# Patient Record
Sex: Male | Born: 1980
Health system: Southern US, Community
[De-identification: ages and names within clinical notes are randomized; demographics above are authoritative.]

## PROBLEM LIST (undated history)

## (undated) DIAGNOSIS — N182 Chronic kidney disease, stage 2 (mild): Secondary | ICD-10-CM

## (undated) DIAGNOSIS — Z21 Asymptomatic human immunodeficiency virus [HIV] infection status: Secondary | ICD-10-CM

## (undated) DIAGNOSIS — E039 Hypothyroidism, unspecified: Secondary | ICD-10-CM

## (undated) DIAGNOSIS — D61818 Other pancytopenia: Secondary | ICD-10-CM

## (undated) DIAGNOSIS — N059 Unspecified nephritic syndrome with unspecified morphologic changes: Secondary | ICD-10-CM

## (undated) DIAGNOSIS — B399 Histoplasmosis, unspecified: Secondary | ICD-10-CM

## (undated) DIAGNOSIS — B20 Human immunodeficiency virus [HIV] disease: Secondary | ICD-10-CM

## (undated) DIAGNOSIS — M7989 Other specified soft tissue disorders: Secondary | ICD-10-CM

## (undated) DIAGNOSIS — R499 Unspecified voice and resonance disorder: Secondary | ICD-10-CM

## (undated) DIAGNOSIS — I509 Heart failure, unspecified: Secondary | ICD-10-CM

## (undated) DIAGNOSIS — R609 Edema, unspecified: Secondary | ICD-10-CM

## (undated) DIAGNOSIS — R221 Localized swelling, mass and lump, neck: Secondary | ICD-10-CM

## (undated) DIAGNOSIS — T7840XA Allergy, unspecified, initial encounter: Secondary | ICD-10-CM

## (undated) HISTORY — DX: Edema, unspecified: R60.9

## (undated) HISTORY — DX: Unspecified voice and resonance disorder: R49.9

## (undated) HISTORY — DX: Unspecified nephritic syndrome with unspecified morphologic changes: N05.9

## (undated) HISTORY — DX: Localized swelling, mass and lump, neck: R22.1

## (undated) HISTORY — PX: NO PAST SURGERIES: SHX2092

## (undated) HISTORY — DX: Allergy, unspecified, initial encounter: T78.40XA

---

## 2005-05-03 ENCOUNTER — Emergency Department (HOSPITAL_COMMUNITY): Admission: EM | Admit: 2005-05-03 | Discharge: 2005-05-03 | Payer: Self-pay | Admitting: Emergency Medicine

## 2005-05-08 ENCOUNTER — Emergency Department (HOSPITAL_COMMUNITY): Admission: EM | Admit: 2005-05-08 | Discharge: 2005-05-09 | Payer: Self-pay | Admitting: Emergency Medicine

## 2006-06-10 ENCOUNTER — Inpatient Hospital Stay (HOSPITAL_COMMUNITY): Admission: EM | Admit: 2006-06-10 | Discharge: 2006-06-24 | Payer: Self-pay | Admitting: Emergency Medicine

## 2006-06-10 ENCOUNTER — Ambulatory Visit: Payer: Self-pay | Admitting: Emergency Medicine

## 2006-06-11 ENCOUNTER — Ambulatory Visit: Payer: Self-pay | Admitting: Infectious Diseases

## 2006-06-15 ENCOUNTER — Encounter: Payer: Self-pay | Admitting: Emergency Medicine

## 2006-06-16 ENCOUNTER — Encounter: Payer: Self-pay | Admitting: Internal Medicine

## 2006-06-18 ENCOUNTER — Encounter (INDEPENDENT_AMBULATORY_CARE_PROVIDER_SITE_OTHER): Payer: Self-pay | Admitting: Internal Medicine

## 2006-06-28 ENCOUNTER — Encounter (INDEPENDENT_AMBULATORY_CARE_PROVIDER_SITE_OTHER): Payer: Self-pay | Admitting: *Deleted

## 2006-06-28 ENCOUNTER — Encounter: Payer: Self-pay | Admitting: Infectious Diseases

## 2006-06-28 ENCOUNTER — Ambulatory Visit: Payer: Self-pay | Admitting: Pulmonary Disease

## 2006-06-28 ENCOUNTER — Inpatient Hospital Stay (HOSPITAL_COMMUNITY): Admission: AD | Admit: 2006-06-28 | Discharge: 2006-07-12 | Payer: Self-pay | Admitting: Internal Medicine

## 2006-06-28 ENCOUNTER — Ambulatory Visit: Payer: Self-pay | Admitting: Internal Medicine

## 2006-06-28 ENCOUNTER — Ambulatory Visit: Payer: Self-pay | Admitting: Hospitalist

## 2006-06-28 DIAGNOSIS — B20 Human immunodeficiency virus [HIV] disease: Secondary | ICD-10-CM | POA: Insufficient documentation

## 2006-06-28 DIAGNOSIS — B399 Histoplasmosis, unspecified: Secondary | ICD-10-CM

## 2006-06-28 HISTORY — DX: Histoplasmosis, unspecified: B39.9

## 2006-06-28 LAB — CONVERTED CEMR LAB
Albumin: 1.9 g/dL — ABNORMAL LOW (ref 3.5–5.2)
Alkaline Phosphatase: 212 units/L — ABNORMAL HIGH (ref 39–117)
Basophils Relative: 0 % (ref 0–1)
Calcium: 8.7 mg/dL (ref 8.4–10.5)
Cortisol, Plasma: 55.7 ug/dL
Glucose, Bld: 138 mg/dL — ABNORMAL HIGH (ref 70–99)
Hemoglobin: 12.7 g/dL — ABNORMAL LOW (ref 13.0–17.0)
Monocytes Absolute: 0 10*3/uL — ABNORMAL LOW (ref 0.2–0.7)
Neutro Abs: 5.3 10*3/uL (ref 1.7–7.7)
Potassium: 4.5 meq/L (ref 3.5–5.3)
RDW: 21 % — ABNORMAL HIGH (ref 11.5–14.0)
Total Bilirubin: 1.2 mg/dL (ref 0.3–1.2)
Total Protein: 6.8 g/dL (ref 6.0–8.3)

## 2006-07-11 ENCOUNTER — Telehealth (INDEPENDENT_AMBULATORY_CARE_PROVIDER_SITE_OTHER): Payer: Self-pay | Admitting: *Deleted

## 2006-07-26 ENCOUNTER — Encounter (INDEPENDENT_AMBULATORY_CARE_PROVIDER_SITE_OTHER): Payer: Self-pay | Admitting: *Deleted

## 2006-07-31 ENCOUNTER — Ambulatory Visit: Payer: Self-pay | Admitting: Internal Medicine

## 2006-07-31 DIAGNOSIS — Z8709 Personal history of other diseases of the respiratory system: Secondary | ICD-10-CM | POA: Insufficient documentation

## 2006-08-01 DIAGNOSIS — D61818 Other pancytopenia: Secondary | ICD-10-CM

## 2006-08-01 DIAGNOSIS — R578 Other shock: Secondary | ICD-10-CM | POA: Insufficient documentation

## 2006-08-03 ENCOUNTER — Telehealth: Payer: Self-pay | Admitting: Internal Medicine

## 2006-08-10 ENCOUNTER — Telehealth: Payer: Self-pay | Admitting: Internal Medicine

## 2006-08-24 ENCOUNTER — Telehealth: Payer: Self-pay | Admitting: Internal Medicine

## 2006-09-05 ENCOUNTER — Telehealth: Payer: Self-pay | Admitting: Internal Medicine

## 2006-09-18 ENCOUNTER — Encounter: Admission: RE | Admit: 2006-09-18 | Discharge: 2006-09-18 | Payer: Self-pay | Admitting: Internal Medicine

## 2006-09-18 ENCOUNTER — Ambulatory Visit: Payer: Self-pay | Admitting: Internal Medicine

## 2006-09-18 ENCOUNTER — Telehealth: Payer: Self-pay | Admitting: Internal Medicine

## 2006-09-18 LAB — CONVERTED CEMR LAB
AST: 22 units/L (ref 0–37)
Albumin: 4.5 g/dL (ref 3.5–5.2)
CO2: 25 meq/L (ref 19–32)
Calcium: 9.2 mg/dL (ref 8.4–10.5)
Chloride: 104 meq/L (ref 96–112)
Creatinine, Ser: 0.7 mg/dL (ref 0.40–1.50)
Eosinophils Absolute: 2.1 10*3/uL — ABNORMAL HIGH (ref 0.0–0.7)
Glucose, Bld: 91 mg/dL (ref 70–99)
Hemoglobin: 16.3 g/dL (ref 13.0–17.0)
Hep B S Ab: NEGATIVE
Lymphs Abs: 1.8 10*3/uL (ref 0.7–3.3)
Monocytes Relative: 7 % (ref 3–11)
Platelets: 226 10*3/uL (ref 150–400)
Potassium: 3.9 meq/L (ref 3.5–5.3)
RBC: 5.21 M/uL (ref 4.22–5.81)
RDW: 14.4 % — ABNORMAL HIGH (ref 11.5–14.0)
Sodium: 138 meq/L (ref 135–145)
Total Bilirubin: 0.3 mg/dL (ref 0.3–1.2)
WBC: 7.1 10*3/uL (ref 4.0–10.5)

## 2006-10-01 ENCOUNTER — Telehealth: Payer: Self-pay | Admitting: Internal Medicine

## 2006-10-03 ENCOUNTER — Ambulatory Visit: Payer: Self-pay | Admitting: Internal Medicine

## 2006-10-05 ENCOUNTER — Encounter (INDEPENDENT_AMBULATORY_CARE_PROVIDER_SITE_OTHER): Payer: Self-pay | Admitting: *Deleted

## 2006-11-01 ENCOUNTER — Telehealth: Payer: Self-pay | Admitting: Internal Medicine

## 2006-11-02 ENCOUNTER — Ambulatory Visit: Payer: Self-pay | Admitting: Internal Medicine

## 2006-12-03 ENCOUNTER — Telehealth: Payer: Self-pay | Admitting: Internal Medicine

## 2006-12-31 ENCOUNTER — Encounter: Payer: Self-pay | Admitting: Internal Medicine

## 2007-01-01 ENCOUNTER — Encounter (INDEPENDENT_AMBULATORY_CARE_PROVIDER_SITE_OTHER): Payer: Self-pay | Admitting: *Deleted

## 2007-01-08 ENCOUNTER — Ambulatory Visit: Payer: Self-pay | Admitting: Internal Medicine

## 2007-01-08 ENCOUNTER — Telehealth: Payer: Self-pay | Admitting: Internal Medicine

## 2007-01-08 ENCOUNTER — Encounter: Admission: RE | Admit: 2007-01-08 | Discharge: 2007-01-08 | Payer: Self-pay | Admitting: Internal Medicine

## 2007-01-08 LAB — CONVERTED CEMR LAB
ALT: 25 units/L (ref 0–53)
BUN: 12 mg/dL (ref 6–23)
Basophils Relative: 0 % (ref 0–1)
Glucose, Bld: 96 mg/dL (ref 70–99)
HIV 1 RNA Quant: 98400 copies/mL — ABNORMAL HIGH (ref <50)
Lymphocytes Relative: 29 % (ref 12–46)
Lymphs Abs: 1.1 10*3/uL (ref 0.7–4.0)
Neutro Abs: 2.2 10*3/uL (ref 1.7–7.7)
Neutrophils Relative %: 58 % (ref 43–77)
RDW: 14.4 % (ref 11.5–15.5)
Sodium: 138 meq/L (ref 135–145)
Total Bilirubin: 0.3 mg/dL (ref 0.3–1.2)
Total Protein: 8.1 g/dL (ref 6.0–8.3)

## 2007-01-09 ENCOUNTER — Ambulatory Visit: Payer: Self-pay | Admitting: Internal Medicine

## 2007-01-10 ENCOUNTER — Telehealth: Payer: Self-pay | Admitting: Internal Medicine

## 2007-01-24 ENCOUNTER — Telehealth: Payer: Self-pay | Admitting: Internal Medicine

## 2007-01-25 ENCOUNTER — Ambulatory Visit: Payer: Self-pay | Admitting: Internal Medicine

## 2007-01-25 DIAGNOSIS — R21 Rash and other nonspecific skin eruption: Secondary | ICD-10-CM

## 2007-01-25 LAB — CONVERTED CEMR LAB

## 2007-02-06 ENCOUNTER — Encounter: Payer: Self-pay | Admitting: Internal Medicine

## 2007-02-06 ENCOUNTER — Encounter (INDEPENDENT_AMBULATORY_CARE_PROVIDER_SITE_OTHER): Payer: Self-pay | Admitting: *Deleted

## 2007-02-12 ENCOUNTER — Telehealth: Payer: Self-pay | Admitting: Internal Medicine

## 2007-02-13 ENCOUNTER — Ambulatory Visit: Payer: Self-pay | Admitting: Internal Medicine

## 2007-02-13 DIAGNOSIS — L408 Other psoriasis: Secondary | ICD-10-CM | POA: Insufficient documentation

## 2007-02-18 ENCOUNTER — Telehealth: Payer: Self-pay | Admitting: Internal Medicine

## 2007-03-14 ENCOUNTER — Telehealth: Payer: Self-pay | Admitting: Internal Medicine

## 2007-03-15 ENCOUNTER — Encounter: Admission: RE | Admit: 2007-03-15 | Discharge: 2007-03-15 | Payer: Self-pay | Admitting: Internal Medicine

## 2007-03-15 ENCOUNTER — Ambulatory Visit: Payer: Self-pay | Admitting: Internal Medicine

## 2007-03-15 LAB — CONVERTED CEMR LAB
ALT: 19 units/L (ref 0–53)
Albumin: 4 g/dL (ref 3.5–5.2)
BUN: 12 mg/dL (ref 6–23)
Basophils Absolute: 0 10*3/uL (ref 0.0–0.1)
Chloride: 104 meq/L (ref 96–112)
Creatinine, Ser: 0.86 mg/dL (ref 0.40–1.50)
Glucose, Bld: 132 mg/dL — ABNORMAL HIGH (ref 70–99)
HCT: 48.9 % (ref 39.0–52.0)
HIV 1 RNA Quant: 181 copies/mL — ABNORMAL HIGH (ref <50)
HIV-1 RNA Quant, Log: 2.26 — ABNORMAL HIGH (ref <1.70)
Lymphs Abs: 1.8 10*3/uL (ref 0.7–4.0)
MCV: 91.2 fL (ref 78.0–100.0)
Monocytes Relative: 6 % (ref 3–12)
Neutro Abs: 3.6 10*3/uL (ref 1.7–7.7)
Neutrophils Relative %: 59 % (ref 43–77)
Platelets: 228 10*3/uL (ref 150–400)
Potassium: 4 meq/L (ref 3.5–5.3)
RDW: 14.8 % (ref 11.5–15.5)
Total Bilirubin: 2.8 mg/dL — ABNORMAL HIGH (ref 0.3–1.2)
WBC: 6.1 10*3/uL (ref 4.0–10.5)

## 2007-03-29 ENCOUNTER — Ambulatory Visit: Payer: Self-pay | Admitting: Internal Medicine

## 2007-04-10 ENCOUNTER — Telehealth (INDEPENDENT_AMBULATORY_CARE_PROVIDER_SITE_OTHER): Payer: Self-pay | Admitting: *Deleted

## 2007-05-09 ENCOUNTER — Telehealth (INDEPENDENT_AMBULATORY_CARE_PROVIDER_SITE_OTHER): Payer: Self-pay | Admitting: *Deleted

## 2007-07-01 ENCOUNTER — Ambulatory Visit: Payer: Self-pay | Admitting: Internal Medicine

## 2007-07-01 ENCOUNTER — Encounter: Admission: RE | Admit: 2007-07-01 | Discharge: 2007-07-01 | Payer: Self-pay | Admitting: Internal Medicine

## 2007-07-01 LAB — CONVERTED CEMR LAB
ALT: 22 units/L (ref 0–53)
Basophils Absolute: 0 10*3/uL (ref 0.0–0.1)
CO2: 22 meq/L (ref 19–32)
Calcium: 9.1 mg/dL (ref 8.4–10.5)
Chloride: 106 meq/L (ref 96–112)
HIV 1 RNA Quant: 96 copies/mL — ABNORMAL HIGH (ref <50)
Hemoglobin: 15.8 g/dL (ref 13.0–17.0)
Lymphocytes Relative: 41 % (ref 12–46)
Lymphs Abs: 1.7 10*3/uL (ref 0.7–4.0)
Monocytes Absolute: 0.5 10*3/uL (ref 0.1–1.0)
Neutro Abs: 1.8 10*3/uL (ref 1.7–7.7)
Platelets: 161 10*3/uL (ref 150–400)
RDW: 15.8 % — ABNORMAL HIGH (ref 11.5–15.5)
Sodium: 139 meq/L (ref 135–145)
Total Protein: 7.1 g/dL (ref 6.0–8.3)
WBC: 4.2 10*3/uL (ref 4.0–10.5)

## 2007-07-09 ENCOUNTER — Telehealth (INDEPENDENT_AMBULATORY_CARE_PROVIDER_SITE_OTHER): Payer: Self-pay | Admitting: *Deleted

## 2007-07-26 ENCOUNTER — Ambulatory Visit: Payer: Self-pay | Admitting: Internal Medicine

## 2007-07-26 DIAGNOSIS — B079 Viral wart, unspecified: Secondary | ICD-10-CM

## 2007-08-08 ENCOUNTER — Telehealth (INDEPENDENT_AMBULATORY_CARE_PROVIDER_SITE_OTHER): Payer: Self-pay | Admitting: *Deleted

## 2007-09-05 ENCOUNTER — Telehealth (INDEPENDENT_AMBULATORY_CARE_PROVIDER_SITE_OTHER): Payer: Self-pay | Admitting: *Deleted

## 2007-10-24 ENCOUNTER — Ambulatory Visit: Payer: Self-pay | Admitting: Internal Medicine

## 2007-10-24 LAB — CONVERTED CEMR LAB
Alkaline Phosphatase: 106 units/L (ref 39–117)
Basophils Absolute: 0 10*3/uL (ref 0.0–0.1)
Basophils Relative: 1 % (ref 0–1)
Eosinophils Absolute: 0.2 10*3/uL (ref 0.0–0.7)
Eosinophils Relative: 4 % (ref 0–5)
Glucose, Bld: 94 mg/dL (ref 70–99)
HCT: 44.7 % (ref 39.0–52.0)
HIV 1 RNA Quant: 240 copies/mL — ABNORMAL HIGH (ref <50)
Lymphs Abs: 2.6 10*3/uL (ref 0.7–4.0)
MCHC: 34.7 g/dL (ref 30.0–36.0)
MCV: 88.3 fL (ref 78.0–100.0)
Platelets: 218 10*3/uL (ref 150–400)
RDW: 14 % (ref 11.5–15.5)
Sodium: 136 meq/L (ref 135–145)
Total Bilirubin: 2.3 mg/dL — ABNORMAL HIGH (ref 0.3–1.2)
Total Protein: 7.5 g/dL (ref 6.0–8.3)

## 2007-11-01 ENCOUNTER — Telehealth (INDEPENDENT_AMBULATORY_CARE_PROVIDER_SITE_OTHER): Payer: Self-pay | Admitting: *Deleted

## 2007-11-15 ENCOUNTER — Ambulatory Visit: Payer: Self-pay | Admitting: Internal Medicine

## 2007-12-04 ENCOUNTER — Telehealth (INDEPENDENT_AMBULATORY_CARE_PROVIDER_SITE_OTHER): Payer: Self-pay | Admitting: *Deleted

## 2008-01-01 ENCOUNTER — Telehealth (INDEPENDENT_AMBULATORY_CARE_PROVIDER_SITE_OTHER): Payer: Self-pay | Admitting: *Deleted

## 2008-01-23 ENCOUNTER — Ambulatory Visit: Payer: Self-pay | Admitting: Internal Medicine

## 2008-01-23 LAB — CONVERTED CEMR LAB
BUN: 11 mg/dL (ref 6–23)
Basophils Relative: 1 % (ref 0–1)
CO2: 22 meq/L (ref 19–32)
Calcium: 9.3 mg/dL (ref 8.4–10.5)
Chloride: 103 meq/L (ref 96–112)
Cholesterol: 167 mg/dL (ref 0–200)
Creatinine, Ser: 0.8 mg/dL (ref 0.40–1.50)
Glucose, Bld: 102 mg/dL — ABNORMAL HIGH (ref 70–99)
HDL: 34 mg/dL — ABNORMAL LOW (ref >39)
Hemoglobin: 17.6 g/dL — ABNORMAL HIGH (ref 13.0–17.0)
Lymphocytes Relative: 29 % (ref 12–46)
MCHC: 35.3 g/dL (ref 30.0–36.0)
Monocytes Absolute: 0.6 10*3/uL (ref 0.1–1.0)
Monocytes Relative: 9 % (ref 3–12)
Neutro Abs: 3.6 10*3/uL (ref 1.7–7.7)
RBC: 5.62 M/uL (ref 4.22–5.81)
Total CHOL/HDL Ratio: 4.9
Triglycerides: 471 mg/dL — ABNORMAL HIGH (ref <150)

## 2008-01-31 ENCOUNTER — Telehealth (INDEPENDENT_AMBULATORY_CARE_PROVIDER_SITE_OTHER): Payer: Self-pay | Admitting: *Deleted

## 2008-02-12 ENCOUNTER — Encounter: Payer: Self-pay | Admitting: Internal Medicine

## 2008-02-21 ENCOUNTER — Encounter (INDEPENDENT_AMBULATORY_CARE_PROVIDER_SITE_OTHER): Payer: Self-pay | Admitting: *Deleted

## 2008-02-21 ENCOUNTER — Ambulatory Visit: Payer: Self-pay | Admitting: Internal Medicine

## 2008-04-14 ENCOUNTER — Encounter (INDEPENDENT_AMBULATORY_CARE_PROVIDER_SITE_OTHER): Payer: Self-pay | Admitting: *Deleted

## 2008-05-06 ENCOUNTER — Encounter (INDEPENDENT_AMBULATORY_CARE_PROVIDER_SITE_OTHER): Payer: Self-pay | Admitting: *Deleted

## 2008-05-21 ENCOUNTER — Ambulatory Visit: Payer: Self-pay | Admitting: Internal Medicine

## 2008-05-21 LAB — CONVERTED CEMR LAB
Albumin: 4.3 g/dL (ref 3.5–5.2)
BUN: 12 mg/dL (ref 6–23)
Basophils Relative: 0 % (ref 0–1)
CO2: 22 meq/L (ref 19–32)
Calcium: 9.4 mg/dL (ref 8.4–10.5)
Chloride: 106 meq/L (ref 96–112)
Creatinine, Ser: 1.22 mg/dL (ref 0.40–1.50)
GFR calc Af Amer: >60 mL/min (ref >60)
MCHC: 35.5 g/dL (ref 30.0–36.0)
Monocytes Relative: 6 % (ref 3–12)
Neutro Abs: 4.3 10*3/uL (ref 1.7–7.7)
Neutrophils Relative %: 65 % (ref 43–77)
Platelets: 232 10*3/uL (ref 150–400)
RBC: 5.03 M/uL (ref 4.22–5.81)
WBC: 6.6 10*3/uL (ref 4.0–10.5)

## 2008-06-05 ENCOUNTER — Ambulatory Visit: Payer: Self-pay | Admitting: Internal Medicine

## 2008-08-14 IMAGING — CR DG CHEST 2V
2 series · 2 of 2 positions shown · non-contrast
Comparison: none

CLINICAL DATA: Dyspnea with rash on chest.
 CHEST - 2 VIEW:

[w chest pa]
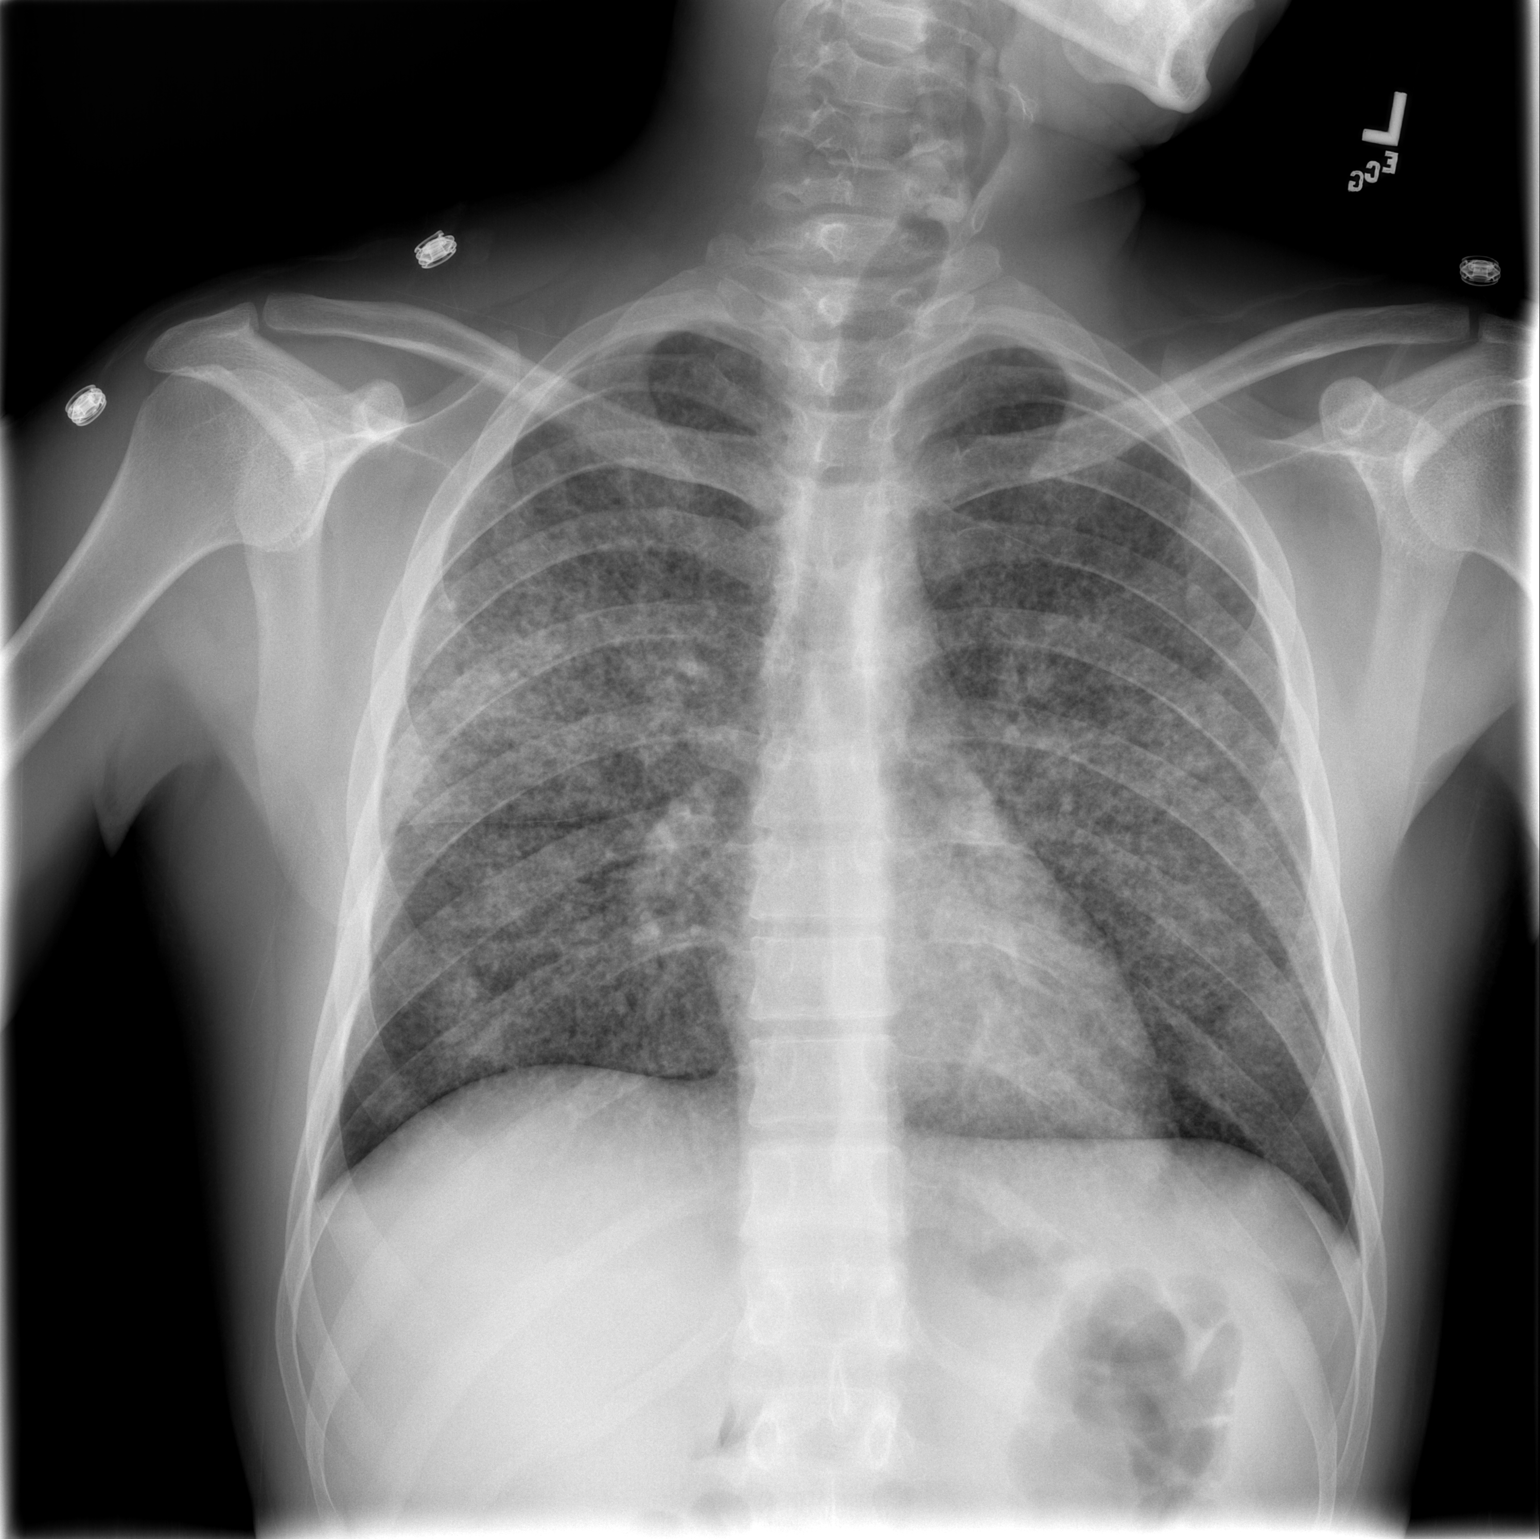

[w chest lat]
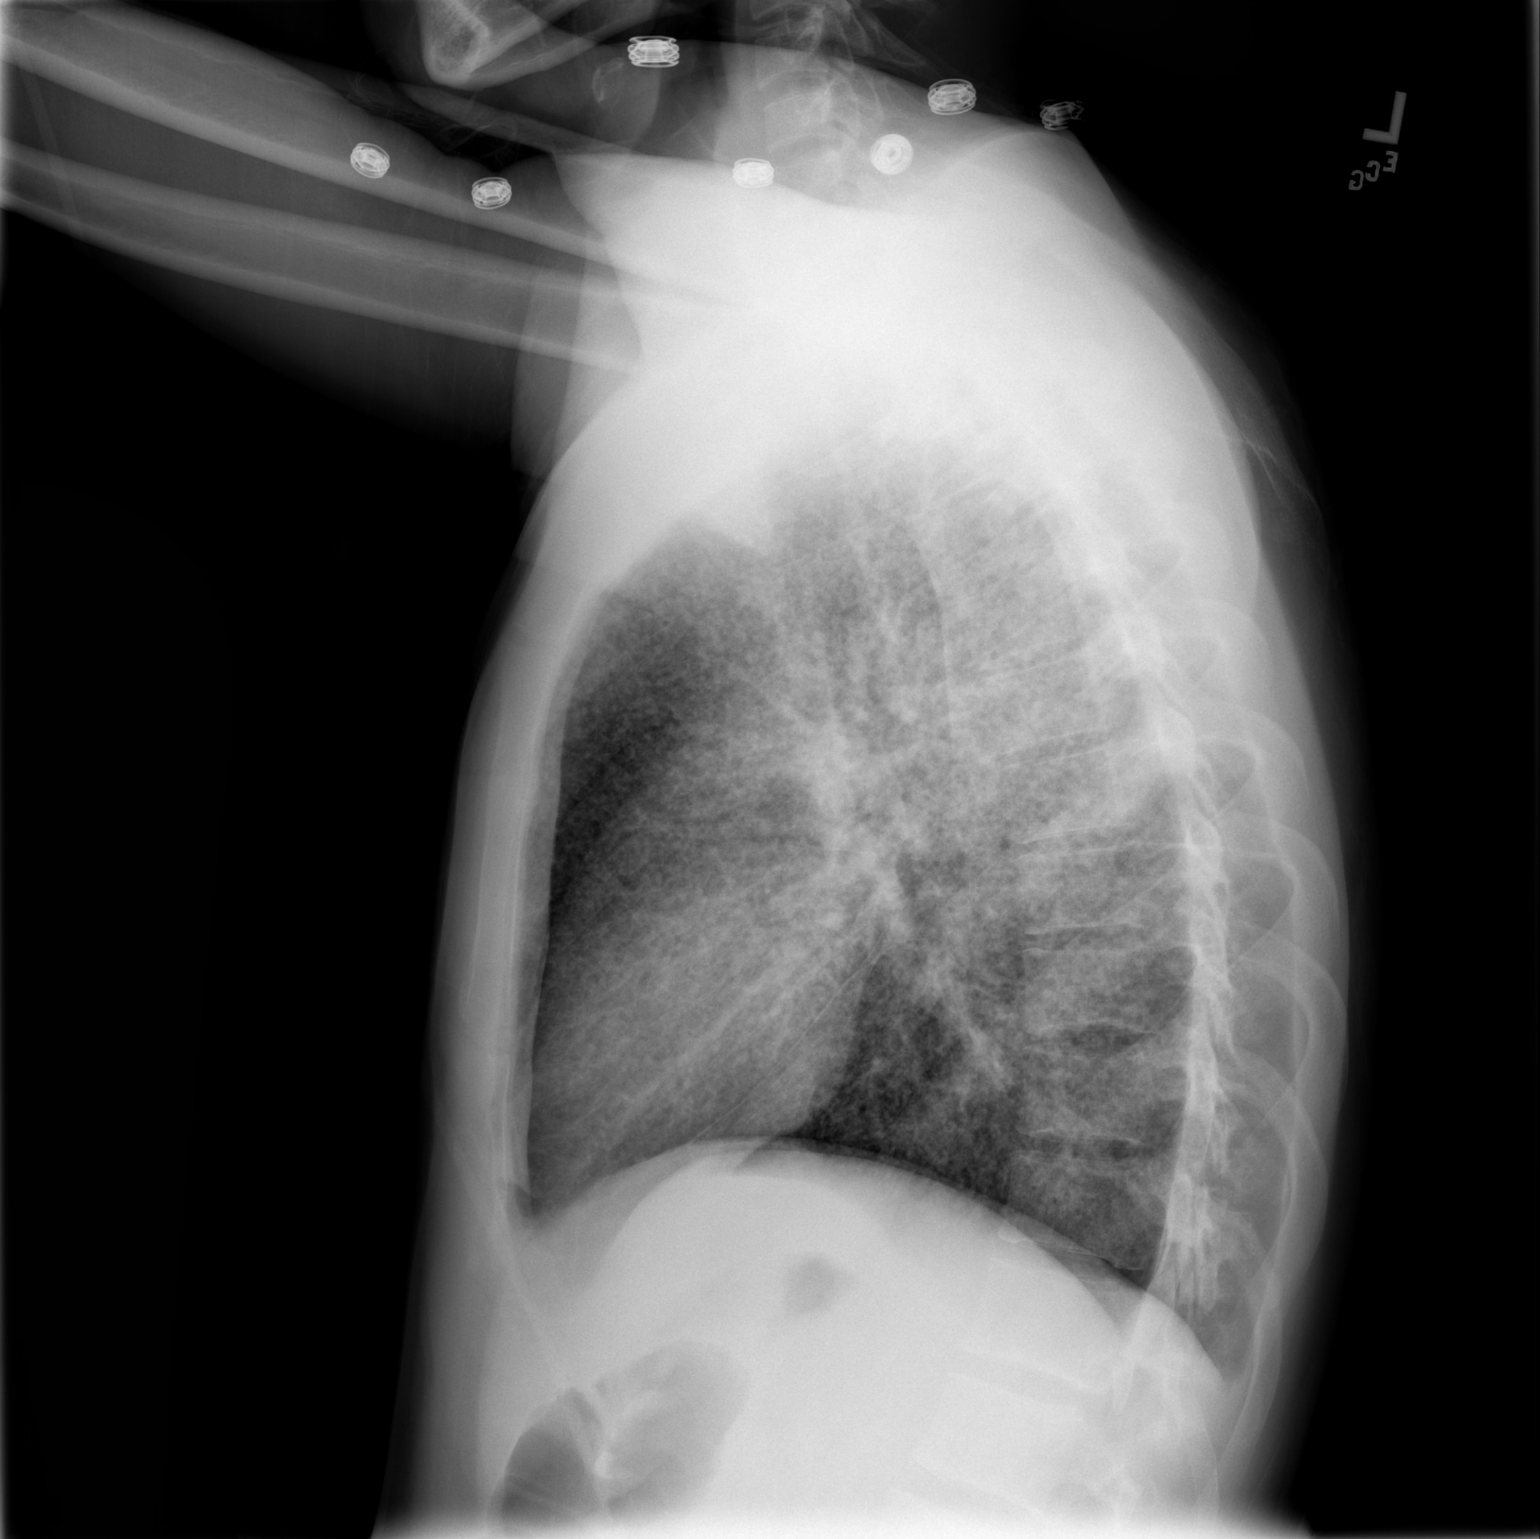

[2 of 2 positions shown; findings below may reference images not displayed]

FINDINGS: There are diffuse bilateral interstitial opacities with a fine nodular pattern.  No evidence of pleural effusions or pneumothorax.  The cardiomediastinal is unremarkable.
IMPRESSION: Diffuse bilateral interstitial opacities with a fine nodular pattern ? differential includes pneumonia (varicella, CMV, atypical or fungal), tuberculosis. Other inflammatory or neoplastic diseases are considered less likely.

## 2008-08-21 IMAGING — CR DG CHEST 1V PORT
1 series · 1 of 1 positions shown · non-contrast
Comparison: none

CLINICAL DATA: Fever

Portable chest at 504:
Comparison to previous day's portable exam. Right PICC line stable. Interval
progression of diffuse airspace infiltrates throughout both lungs. No definite
effusion. Heart size upper limits normal.

[view not recorded]
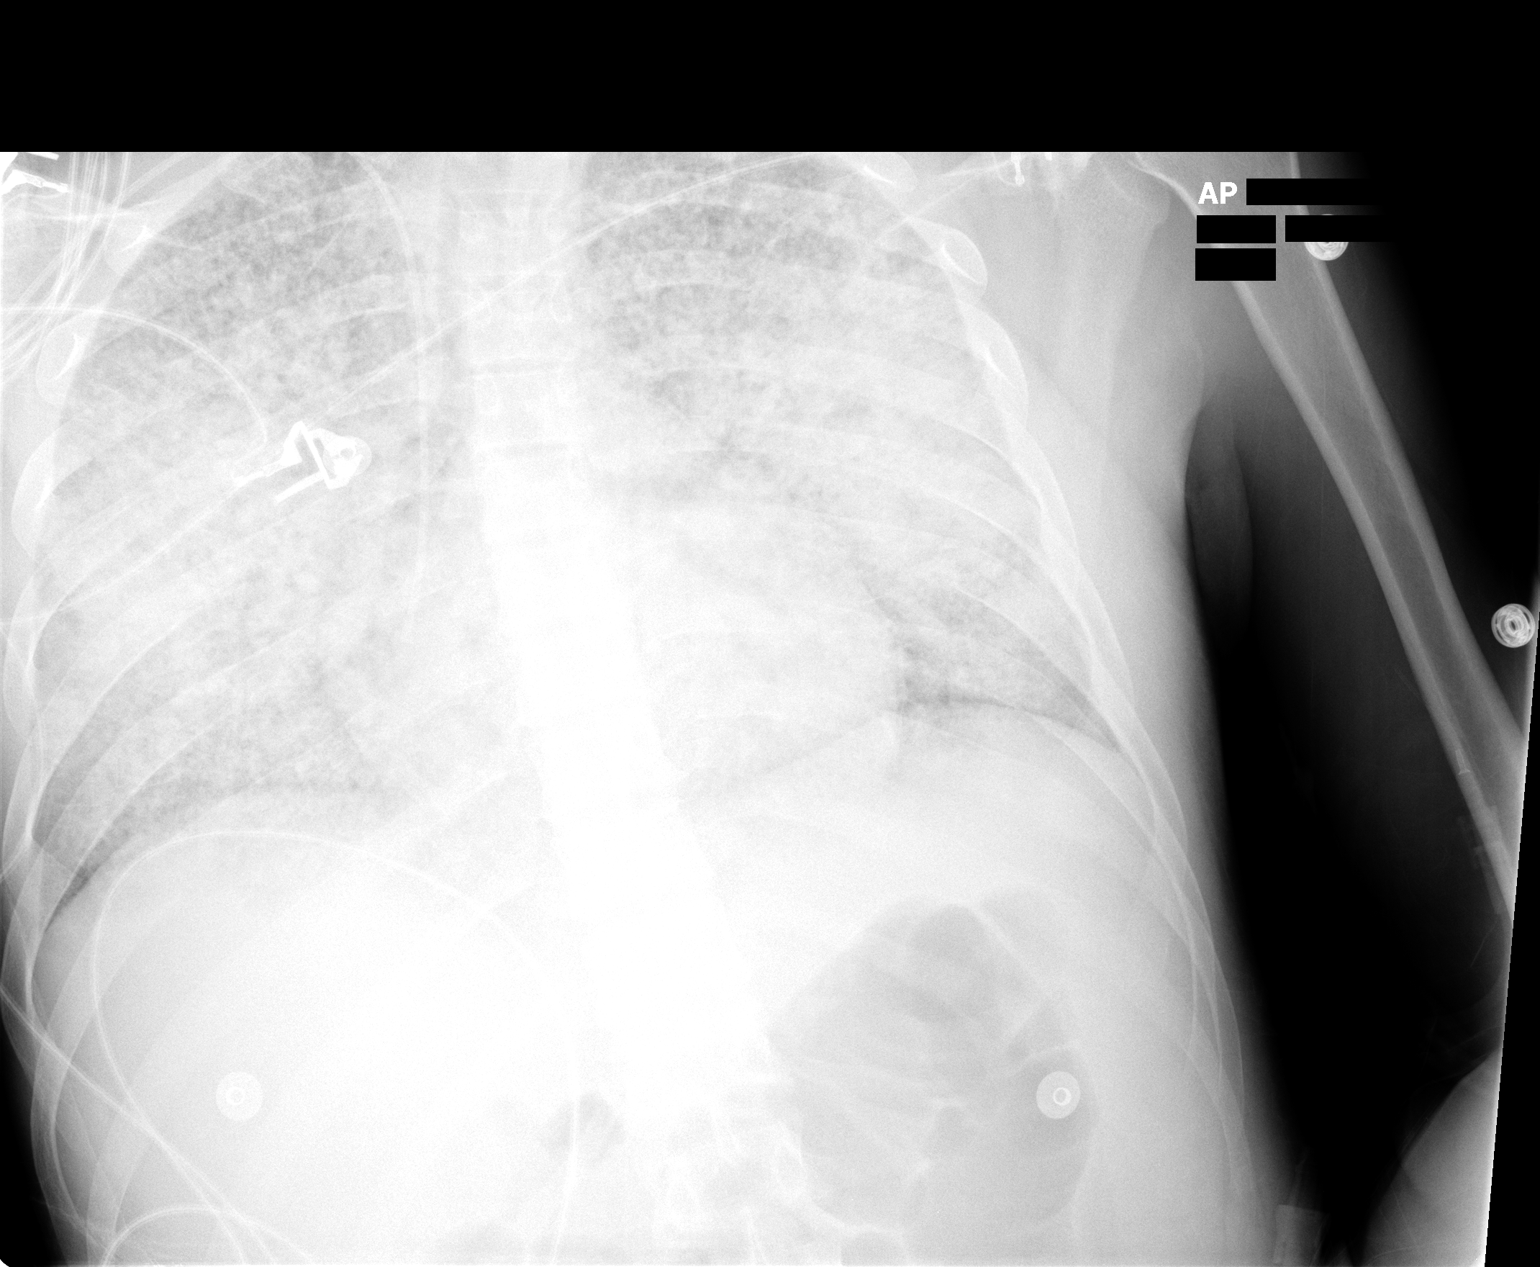

[1 of 1 positions shown; findings below may reference images not displayed]

IMPRESSION: 1. Worsening bilateral airspace infiltrates

## 2008-08-28 IMAGING — CR DG CHEST 2V
2 series · 2 of 2 positions shown · non-contrast
Comparison: 06/19/2006

CLINICAL DATA: 26-year-old male, fever and pneumonia.  Followup exam.
CHEST - 2 VIEW:

[w chest pa]
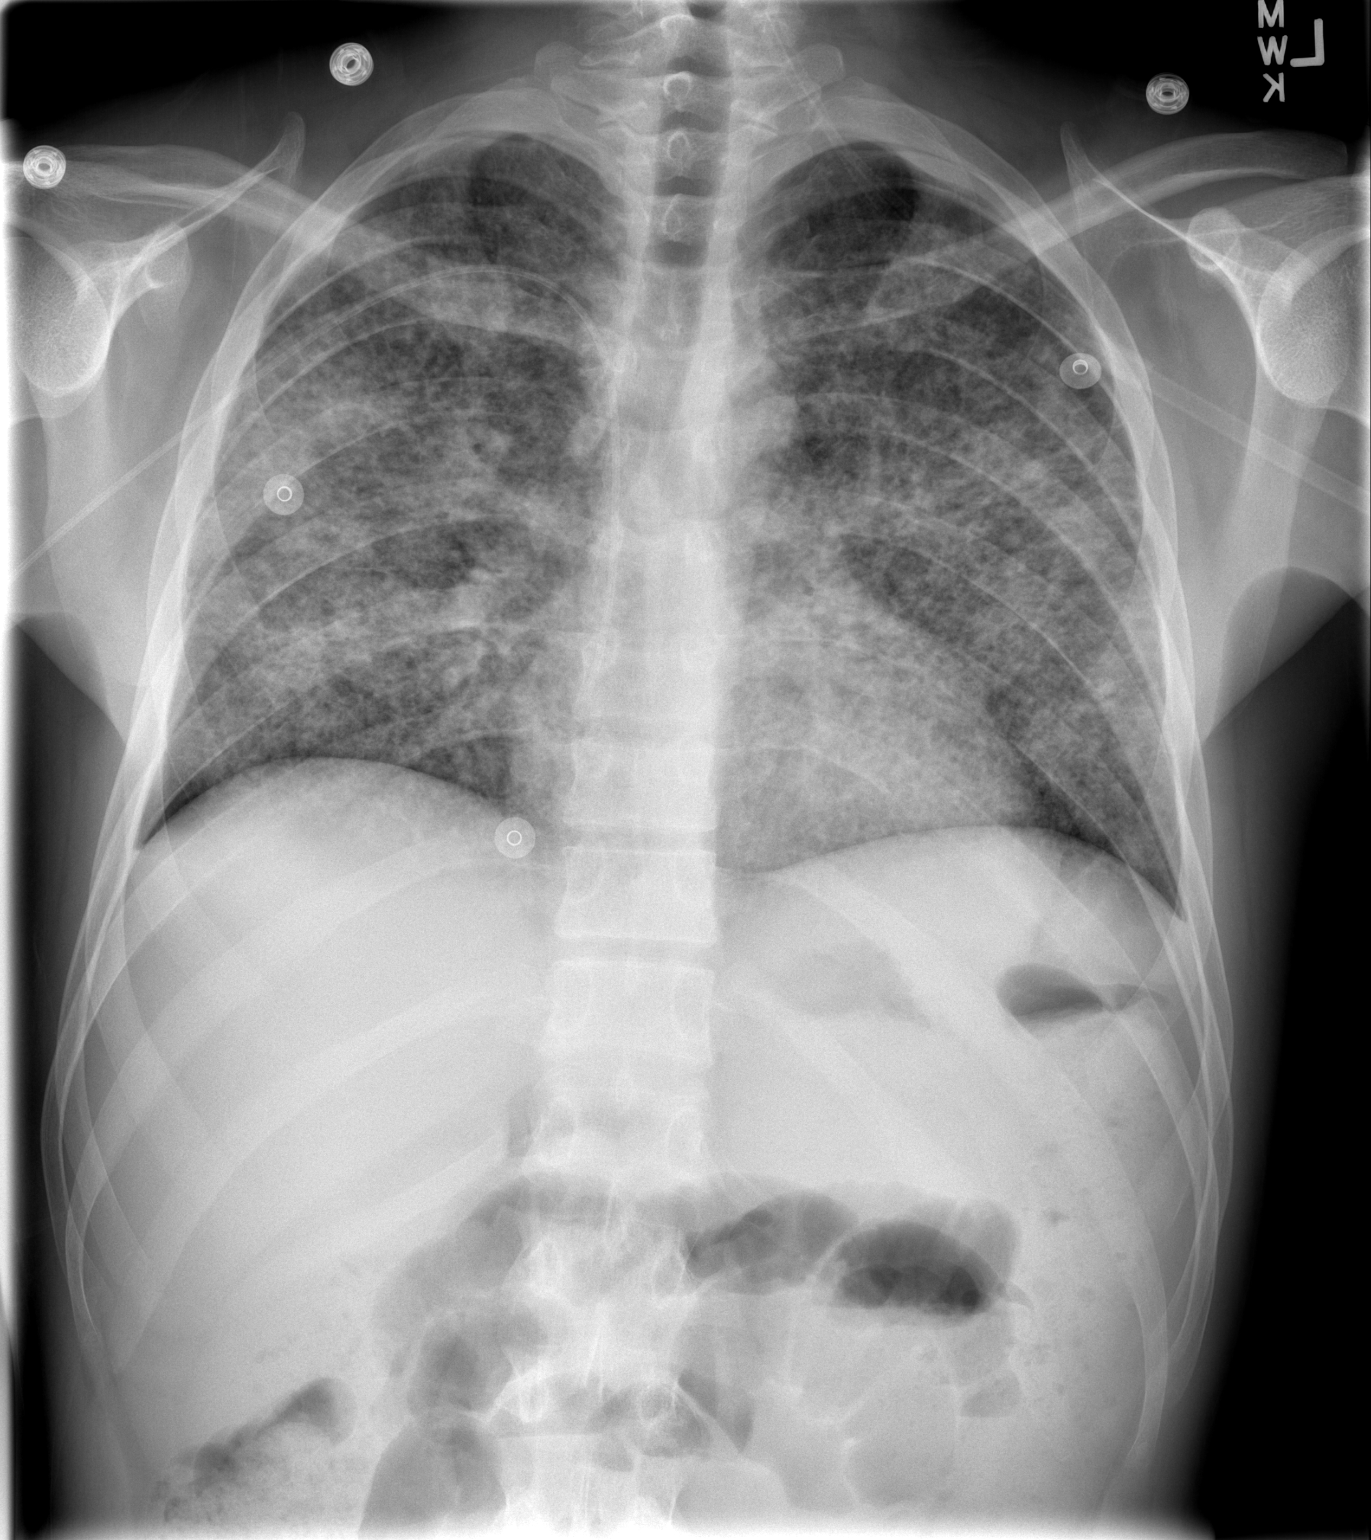

[w chest lat]
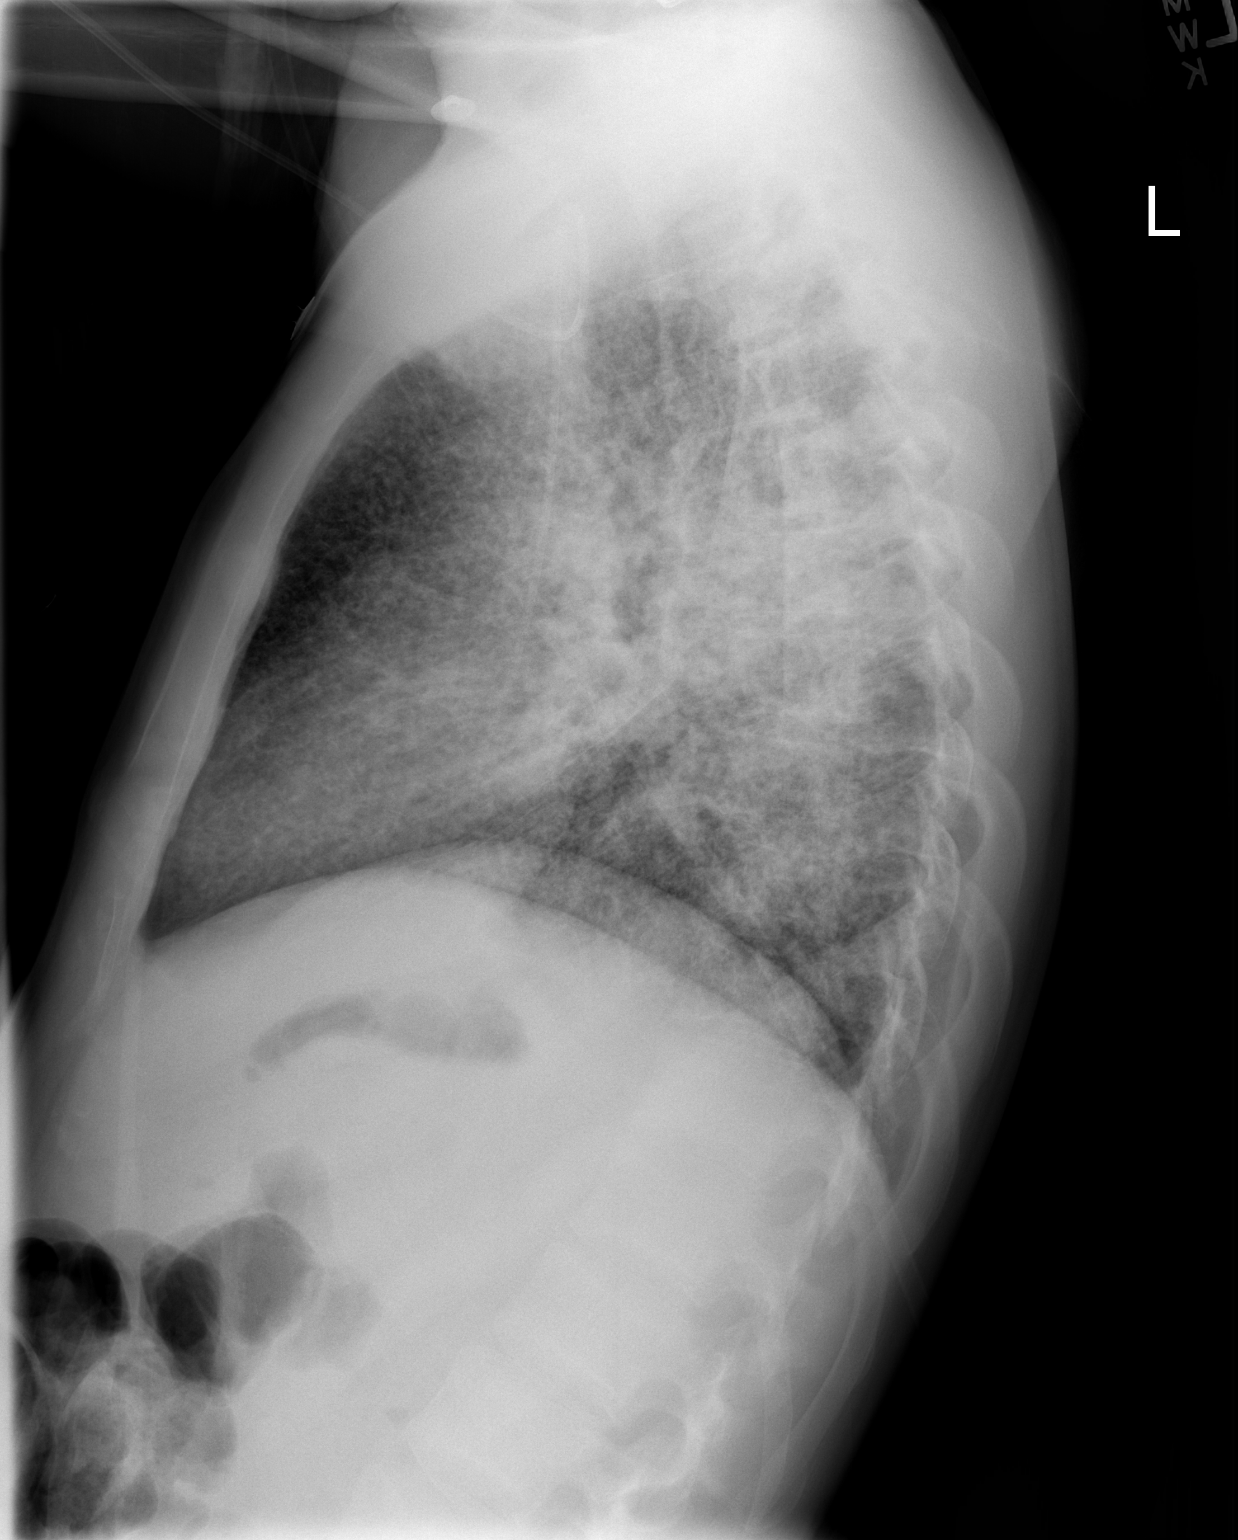

[2 of 2 positions shown; findings below may reference images not displayed]

FINDINGS: Right PICC line tip is in the SVC-RA junction.  Normal heart size.  Interval improvement in the bilateral diffuse airspace process consistent with improving pneumonia.  No large effusion or pneumothorax.
IMPRESSION: Improvement in diffuse airspace process consistent with resolving pneumonia, atypical infection, TB or pneumocystis.

## 2008-09-03 ENCOUNTER — Ambulatory Visit: Payer: Self-pay | Admitting: Internal Medicine

## 2008-09-03 LAB — CONVERTED CEMR LAB
Alkaline Phosphatase: 88 units/L (ref 39–117)
BUN: 16 mg/dL (ref 6–23)
Basophils Relative: 1 % (ref 0–1)
CO2: 25 meq/L (ref 19–32)
Creatinine, Ser: 1.2 mg/dL (ref 0.40–1.50)
Eosinophils Absolute: 0.3 10*3/uL (ref 0.0–0.7)
Eosinophils Relative: 4 % (ref 0–5)
Glucose, Bld: 91 mg/dL (ref 70–99)
HCT: 42.7 % (ref 39.0–52.0)
Hemoglobin: 15.1 g/dL (ref 13.0–17.0)
Lymphs Abs: 2.1 10*3/uL (ref 0.7–4.0)
MCHC: 35.4 g/dL (ref 30.0–36.0)
MCV: 88.2 fL (ref >=78.0)
Monocytes Absolute: 0.6 10*3/uL (ref 0.1–1.0)
Monocytes Relative: 8 % (ref 3–12)
RBC: 4.84 M/uL (ref 4.22–5.81)
Sodium: 140 meq/L (ref 135–145)
Total Bilirubin: 2.9 mg/dL — ABNORMAL HIGH (ref 0.3–1.2)
Total Protein: 7.8 g/dL (ref 6.0–8.3)
WBC: 7.5 10*3/uL (ref 4.0–10.5)

## 2008-09-18 ENCOUNTER — Ambulatory Visit: Payer: Self-pay | Admitting: Internal Medicine

## 2008-12-23 ENCOUNTER — Ambulatory Visit: Payer: Self-pay | Admitting: Internal Medicine

## 2008-12-23 LAB — CONVERTED CEMR LAB
ALT: 29 units/L (ref 0–53)
Basophils Relative: 1 % (ref 0–1)
CO2: 20 meq/L (ref 19–32)
Calcium: 9 mg/dL (ref 8.4–10.5)
Chloride: 103 meq/L (ref 96–112)
Creatinine, Ser: 1.01 mg/dL (ref 0.40–1.50)
Eosinophils Absolute: 0.3 10*3/uL (ref 0.0–0.7)
Glucose, Bld: 109 mg/dL — ABNORMAL HIGH (ref 70–99)
HIV 1 RNA Quant: 111 copies/mL — ABNORMAL HIGH (ref <48)
Hemoglobin: 15.1 g/dL (ref 13.0–17.0)
Hepatitis B Surface Ag: NEGATIVE
Lymphs Abs: 1.6 10*3/uL (ref 0.7–4.0)
MCHC: 34.2 g/dL (ref 30.0–36.0)
MCV: 94.8 fL (ref >=78.0)
Monocytes Absolute: 0.5 10*3/uL (ref 0.1–1.0)
Monocytes Relative: 8 % (ref 3–12)
Neutrophils Relative %: 60 % (ref 43–77)
RBC: 4.66 M/uL (ref 4.22–5.81)
Sodium: 136 meq/L (ref 135–145)
Total Bilirubin: 2.8 mg/dL — ABNORMAL HIGH (ref 0.3–1.2)
Total Protein: 7.5 g/dL (ref 6.0–8.3)
WBC: 6.1 10*3/uL (ref 4.0–10.5)

## 2009-01-08 ENCOUNTER — Ambulatory Visit: Payer: Self-pay | Admitting: Internal Medicine

## 2009-04-08 ENCOUNTER — Ambulatory Visit: Payer: Self-pay | Admitting: Internal Medicine

## 2009-04-08 LAB — CONVERTED CEMR LAB
ALT: 27 U/L
AST: 33 U/L
Albumin: 4.6 g/dL
Alkaline Phosphatase: 76 U/L
BUN: 17 mg/dL
Basophils Absolute: 0.1 K/uL
Basophils Relative: 1 %
CO2: 21 meq/L
Calcium: 8.3 mg/dL — ABNORMAL LOW
Chloride: 107 meq/L
Creatinine, Ser: 1.31 mg/dL
Eosinophils Absolute: 0.3 K/uL
Eosinophils Relative: 4 %
Glucose, Bld: 89 mg/dL
HCT: 42.2 %
HIV 1 RNA Quant: <48 {copies}/mL
HIV-1 RNA Quant, Log: <1.68
Hemoglobin: 14.1 g/dL
Lymphocytes Relative: 27 %
Lymphs Abs: 2 K/uL
MCHC: 33.4 g/dL
MCV: 95.5 fL
Monocytes Absolute: 0.5 K/uL
Monocytes Relative: 6 %
Neutro Abs: 4.6 K/uL
Neutrophils Relative %: 62 %
Platelets: 252 K/uL
Potassium: 4.1 meq/L
RBC: 4.42 M/uL
RDW: 14.7 %
Sodium: 139 meq/L
Total Bilirubin: 2.4 mg/dL — ABNORMAL HIGH
Total Protein: 7.3 g/dL
WBC: 7.3 10*3/microliter

## 2009-04-27 ENCOUNTER — Ambulatory Visit: Payer: Self-pay | Admitting: Internal Medicine

## 2009-05-14 ENCOUNTER — Encounter (INDEPENDENT_AMBULATORY_CARE_PROVIDER_SITE_OTHER): Payer: Self-pay | Admitting: *Deleted

## 2009-09-09 ENCOUNTER — Encounter (INDEPENDENT_AMBULATORY_CARE_PROVIDER_SITE_OTHER): Payer: Self-pay | Admitting: *Deleted

## 2009-09-09 ENCOUNTER — Telehealth: Payer: Self-pay | Admitting: Internal Medicine

## 2009-09-21 ENCOUNTER — Telehealth (INDEPENDENT_AMBULATORY_CARE_PROVIDER_SITE_OTHER): Payer: Self-pay | Admitting: *Deleted

## 2009-11-30 ENCOUNTER — Encounter (INDEPENDENT_AMBULATORY_CARE_PROVIDER_SITE_OTHER): Payer: Self-pay | Admitting: *Deleted

## 2010-02-01 NOTE — Assessment & Plan Note (Signed)
Summary: 37month f/u/vs   CC:  follow-up visit, lab results, c/o of sorethroat for four days, and low grade fever.  History of Present Illness: (Interpreter utilized) Pt c/o a mild sore throat and low grade fever. No cogh or sinus pressure. No missed doses of his HIV meds.  Preventive Screening-Counseling & Management  Alcohol-Tobacco     Alcohol drinks/day: 0     Alcohol type: rarely     Smoking Status: quit     Smoking Cessation Counseling: yes     Year Quit: 2007     Cans of tobacco/week: no     Passive Smoke Exposure: yes  Caffeine-Diet-Exercise     Caffeine use/day: sodas everyday     Does Patient Exercise: yes     Type of exercise: active at work     Exercise (avg: min/session): >60     Times/week: 5  Hep-HIV-STD-Contraception     HIV Risk: no  Safety-Violence-Falls     Seat Belt Use: yes      Drug Use:  no.     Updated Prior Medication List: TRUVADA 200-300 MG  TABS (EMTRICITABINE-TENOFOVIR) Take 1 tablet by mouth once a day NORVIR 100 MG  CAPS (RITONAVIR) Take 1 tablet by mouth once a day REYATAZ 300 MG  CAPS (ATAZANAVIR SULFATE) Take 1 tablet by mouth once a day  Current Allergies (reviewed today): No known allergies  Past History:  Past Medical History: Last updated: 07/31/2006 HIV disease Pneumonia, hx of  Review of Systems  The patient denies anorexia, fever, and weight loss.    Vital Signs:  Patient profile:   30 year old male Height:      67 inches (170.18 cm) Weight:      224.8 pounds (102.18 kg) BMI:     35.34 Temp:     99.0 degrees F (37.22 degrees C) oral Pulse rate:   89 / minute BP sitting:   117 / 78  (left arm)  Vitals Entered By: Wendall Mola CMA Duncan Dull) (April 27, 2009 3:33 PM) CC: follow-up visit, lab results, c/o of sorethroat for four days, low grade fever Is Patient Diabetic? No Pain Assessment Patient in pain? yes     Location: throat Intensity: 3 Type: dull Onset of pain  Constant Nutritional Status BMI of  25 - 29 = overweight Nutritional Status Detail appetite "good"  Does patient need assistance? Functional Status Self care Ambulation Normal Comments no missed doses of meds per patient   Physical Exam  General:  alert, well-developed, well-nourished, and well-hydrated.   Head:  normocephalic and atraumatic.   Mouth:  no exudates and pharyngeal erythema.   Neck:  no cervical lymphadenopathy.      Impression & Recommendations:  Problem # 1:  HIV DISEASE (ICD-042) Pt.s most recent CD4ct was 400 and VL <48 .  Pt instructed to continue the current antiretroviral regimen.  Pt encouraged to take medication regularly and not miss doses.  Pt will f/u in 3 months for repeat blood work and will see me 2 weeks later.  Diagnostics Reviewed:  HIV: CDC-defined AIDS (02/21/2008)   CD4: 400 (04/09/2009)   WBC: 7.3 (04/08/2009)   Hgb: 14.1 (04/08/2009)   HCT: 42.2 (04/08/2009)   Platelets: 252 (04/08/2009) HIV genotype: REPORT (01/25/2007)   HIV-1 RNA: <48 copies/mL (04/08/2009)   HBSAg: NEG (12/23/2008)  Other Orders: Est. Patient Level III (16109) Future Orders: T-CD4SP ([facility]) (CD4SP) ... 07/26/2009 T-HIV Viral Load [phone]) ... 07/26/2009 T-Comprehensive Metabolic Panel [phone]) ... 07/26/2009 T-CBC w/Diff (  [id number]) ... 07/26/2009 T-Lipid Profile [phone]) ... 07/26/2009  Patient Instructions: 1)  Please schedule a follow-up appointment in 3 months, 2 weeks after labs.

## 2010-02-01 NOTE — Miscellaneous (Signed)
Summary: RW update  Clinical Lists Changes  Observations: Added new observation of RACE: White (09/09/2009 11:24)

## 2010-02-01 NOTE — Miscellaneous (Signed)
  Clinical Lists Changes  Observations: Added new observation of YEARAIDSPOS: 2008  (11/30/2009 11:34)

## 2010-02-01 NOTE — Progress Notes (Signed)
Summary: PPD  Phone Note Outgoing Call   Call placed by: Annice Pih Summary of Call: Pt. needs PPD at next office visit Initial call taken by: Wendall Mola CMA Duncan Dull),  September 21, 2009 4:31 PM

## 2010-02-01 NOTE — Miscellaneous (Signed)
Summary: clinical update/ryan white NCADAP app completed  Clinical Lists Changes  Observations: Added new observation of AIDSDAP: Pending (05/14/2009 16:40) Added new observation of PCTFPL: 118.26  (05/14/2009 16:40) Added new observation of HOUSEINCOME: 57846  (05/14/2009 16:40) Added new observation of FINASSESSDT: 05/14/2009  (05/14/2009 16:40)

## 2010-02-01 NOTE — Assessment & Plan Note (Signed)
Summary: F/U VISIT/CH   History of Present Illness: (Interpreter utilized) Pt doing well.  No missed doses of his HIV meds.   Updated Prior Medication List: TRUVADA 200-300 MG  TABS (EMTRICITABINE-TENOFOVIR) Take 1 tablet by mouth once a day NORVIR 100 MG  CAPS (RITONAVIR) Take 1 tablet by mouth once a day REYATAZ 300 MG  CAPS (ATAZANAVIR SULFATE) Take 1 tablet by mouth once a day LUXIQ 0.12 %  FOAM (BETAMETHASONE VALERATE) apply two times a day  Current Allergies: No known allergies  Past History:  Past Medical History: Last updated: 07/31/2006 HIV disease Pneumonia, hx of  Review of Systems  The patient denies anorexia, fever, and weight loss.    Vital Signs:  Patient profile:   30 year old male Height:      67 inches Weight:      220 pounds BMI:     34.58 BSA:     2.11 Temp:     97.6 degrees F oral Pulse rate:   78 / minute BP sitting:   124 / 75  (left arm)  Vitals Entered By: Tomasita Morrow RN (January 08, 2009 10:24 AM)  Physical Exam  General:  alert, well-developed, well-nourished, and well-hydrated.   Head:  normocephalic and atraumatic.   Mouth:  pharynx pink and moist.  no thrush  Lungs:  normal breath sounds.     Impression & Recommendations:  Problem # 1:  HIV DISEASE (ICD-042) Pt.s most recent CD4ct was 400 and VL  111.  Pt instructed to continue the current antiretroviral regimen.  Pt encouraged to take medication regularly and not miss doses.  Pt will f/u in 3 months for repeat blood work and will see me 2 weeks later.  Diagnostics Reviewed:  HIV: CDC-defined AIDS (02/21/2008)   CD4: 400 (12/24/2008)   WBC: 6.1 (12/23/2008)   Hgb: 15.1 (12/23/2008)   HCT: 44.2 (12/23/2008)   Platelets: 205 (12/23/2008) HIV genotype: REPORT (01/25/2007)   HIV-1 RNA: 111 (12/23/2008)   HBSAg: NEG (12/23/2008)  Other Orders: Est. Patient Level III (14782) Future Orders: T-CD4SP ([facility]) (CD4SP) ... 04/08/2009 T-HIV Viral Load [phone]) ...  04/08/2009 T-Comprehensive Metabolic Panel [phone]) ... 04/08/2009 T-Comprehensive Metabolic Panel [phone]) ... 04/08/2009 T-CBC w/Diff ([id number]) ... 04/08/2009    Patient Instructions: 1)  Please schedule a follow-up appointment in 3 months, 2 weeks after labs. Process Orders Check Orders Results:     [facility]: ABN not required for this insurance Tests Sent for requisitioning (January 08, 2009 3:29 PM):     04/08/2009: [facility] -- T-HIV Viral Load [phone] (signed)     04/08/2009: [facility] -- T-Comprehensive Metabolic Panel [[id number]] (signed)     04/08/2009: [facility] -- T-Comprehensive Metabolic Panel [[id number]] (signed)     04/08/2009: [facility] -- [facility] w/Diff [[id number]] (signed)

## 2010-02-01 NOTE — Progress Notes (Signed)
Summary: change in drug formulation per nurse  Phone Note From Pharmacy   Caller: [facility]. Details for Reason: change in drug formulation Summary of Call: Received  a fax requesting a change in the formulation of patient's Norvir 100mg  Capsule to Norvir 100mg  tablet once daily  spoke to [place], CMA who stated it was ok to change.  Pharmacy notified. Initial call taken by: Paulo Fruit  BS,CPht II,MPH,  September 09, 2009 10:37 AM    New/Updated Medications: NORVIR 100 MG TABS (RITONAVIR) Take 1 tablet by mouth once a day Prescriptions: NORVIR 100 MG TABS (RITONAVIR) Take 1 tablet by mouth once a day  #30 x 6   Entered by:   Paulo Fruit  BS,CPht II,MPH   Authorized by:   Yisroel Ramming MD   Signed by:   Paulo Fruit  BS,CPht II,MPH on 09/09/2009   Method used:   Electronically to        [facility] [phone]* (retail)       [address]       [place], [place]  [address]       Ph: [phone]       Fax:    RxID:   [id number]  Paulo Fruit  BS,CPht II,MPH  September 09, 2009 10:38 AM

## 2010-04-08 LAB — T-HELPER CELL (CD4) - (RCID CLINIC ONLY): CD4 T Cell Abs: 490 uL (ref 400–2700)

## 2010-04-12 LAB — T-HELPER CELL (CD4) - (RCID CLINIC ONLY)
CD4 % Helper T Cell: 25 % — ABNORMAL LOW (ref 33–55)
CD4 T Cell Abs: 430 uL (ref 400–2700)

## 2010-04-18 LAB — T-HELPER CELL (CD4) - (RCID CLINIC ONLY)
CD4 % Helper T Cell: 19 % — ABNORMAL LOW (ref 33–55)
CD4 T Cell Abs: 310 uL — ABNORMAL LOW (ref 400–2700)

## 2010-05-17 NOTE — Discharge Summary (Signed)
NAMEANIELLO, David                ACCOUNT NO.:  [id number]   MEDICAL RECORD NO.:  [id number]          PATIENT TYPE:  INP   LOCATION:  5713                         FACILITY:  [facility]   PHYSICIAN:  Jason Coop, MD DATE OF BIRTH:  22-Jul-1980   DATE OF ADMISSION:  06/28/2006  DATE OF DISCHARGE:  07/12/2006                               DISCHARGE SUMMARY   DIAGNOSIS ON DISCHARGE  1. Acquired immune-deficiency syndrome.  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  3. Transaminitis.  4. Hyperglycemia.  5. Hypoalbuminemia.   MEDICATIONS ON DISCHARGE:  1. Azithromycin 1200 mg PO weekly.  2. Bactrim 1 tablet PO daily.  3. K-Dur 40 mEq PO daily.  4. Prednisone on tapering dose of 14 mg PO daily for first week, 30 mg      daily for the next one week, 20 mg daily for the next one week, and      10 mg daily for the last week, and then stop.  5. Albuterol 1 puff every 3-6 hours for shortness of breath.  6. Itraconazole 200 mg PO b.i.d. for the next 6 weeks.   DISPOSITION:  David Knapp will see Dr. Philipp Deputy in ID clinic on July 31, 2006, at 3:30 p.m., and he will be reviewed for his current medication,  itraconazole 200 mg, tapering dose of prednisone, prophylactic  medication azithromycin and Bactrim, as well as potassium  supplementation.  He will also be started on HARRT therapy at that  appointment.  He will follow up with his PCP Dr. Aleene Davidson on August 6, 3.00 PM.   PROCEDURES:  1. Ultrasound of the abdomen and pelvis dated July 1: Cholelithiasis      without gallbladder wall thickening or biliary dilatation.   1. Bronchial washings and smears:  No malignant cells identified.   1. Skin biopsy of left lower leg rash dated June 13 and June 28, 2006      which shows a yeast, which is consistent with Histoplasma, but      correlation of the culture study is recommended.   1. Echo dated June 18, 2006:  Overall normal left ventricular systolic      function noted.   Ejection fraction estimated to 55-60%.  No      diagnostic evidence of left ventricular regional wall motion      abnormalities.   CONSULTS:  PT/OT and ID.   HISTORY OF PRESENT ILLNESS:  David Knapp is a 30 year old male who was  recently hospitalized for respiratory distress, newly diagnosed HIV with  CD4 count less than 10 and a viral load of 64,300 from June 8 to June 16, 2006, and sent home on steroid tapering, azithromycin and home  oxygen. The patient is negative for AFBx2, negative for PCP.  The  patient had initially developed a nonpruritic papular rash starting on  extremities and spreading to entire body.  One month back, the patient  developed positive productive cough, emesis, tachypnea.  No chills, no  diarrhea or constipation.  A 30-pound weight loss over past 2 months was  noted.  The patient has had  a nonproductive cough, pleuritic chest pain,  shortness of breath on exertion, and generalized weakness,  fever,  diaphoresis.  The patient was seen in the ID clinic and was referred to  ER for further treatment.   EXAMINATION:  VITAL SIGNS:  Temperature 100 , BP 98/65, pulse 161,  oxygen saturation 95% on 5 liters.  GENERAL:  Looked cachectic, pale.  He is mildly icteric.  NECK:  Supple.  No lymphadenopathy.  RESPIRATORY:  Clear to auscultation with good air movement.  CARDIOVASCULAR:  Regular rate and rhythm.  No rubs, murmurs, or gallops.  First and second heart sounds normal.  GI:  Bowel sounds normal.  Soft, nontender.  No organomegaly, no masses.  EXTREMITIES:  No edema, cyanosis, clubbing.  SKIN:  Shows diffuse macular violet rash.  No clavicular, or inguinal  nodes.  NEUROLOGICAL EXAM:  Oriented x3, alert.  Moves all 4 limbs. Babinski  negative, DTRs 0/4 and symmetrical.   LABORATORY ON ADMISSION:  ABG 7.44/33/67/22.4 on 5 liters.  Morning  Cortisol 56, PTT 33, PT 14.7, INR 1.1.  Sodium 128, potassium 4.5,  chloride 96, bicarb 19, BUN 25, creatinine 0.9, glucose  138.  WBC 5.4,  hemoglobin 12.7, glucose 202, MCV 82.4, RDW 21, ANC 5.5, anion gap 13,  bili 1.2, alk phos 212.  SGOT 77, SGPT 72, protein is 6.8, albumin 1.9,  and calcium 8.7.   HOSPITAL COURSE:  1. Advanced HIV: He is getting prophylactic azithromycin and Bactrim      and will continue that, and we did not start him on HARRT because      of the possible immune reconstitution syndrome. We will start it      when he follows up in ID clinic.   1. Disseminated histoplasmosis:  He is getting Amphotericin B for that      since June 30 and he got it for 11 days, and he is also on      potassium supplementation for the possible change in potassium due      to Amphotericin B.  His magnesium has been stable during his      treatment course, and his last magnesium level on July 5 was 2.5.      His renal function has been stable, and his creatinine for the last      time is 0.93.  He was also on oxygen and tapering dose of steroids.   1. Hypoalbuminemia: We do not know the cause, but we think it might be      due to the possible altered liver function secondary to histoplasma      infiltration of liver. He is getting protein rich diet.   1. Hyperglycemia: We are also treating his steriod induced      heprglycemia with lantus and SSI.   DISCHARGE LABORATORY:  WBC 11.7, hemoglobin 11.3, hematocrit 38.8,  platelets 276.  MCV 90.1, RDW 28.  Sodium 138, potassium 3.5, chloride  106, bicarb 26, BUN 20, creatinine 0.93, and glucose 152.  Total bili  0.7, alk phos 223, AST 37, ALT 91, protein 5.7, albumin 2.5, calcium  8.7.   VITAL SIGNS:  Temperature 97.7, blood pressure 103/66, pulse 96,  respiration 20, and saturating 99% on room air.   CONDITION ON DISCHARGE  He has no cough, no dyspnea, is afebrile and ambulating without oxygen  saturating more than 92 percent.      Jason Coop, MD  Electronically Signed     YP/MEDQ  D:  07/12/2006  T:  07/13/2006  Job:  [id number]   cc:    Tresa Endo L. Philipp Deputy, M.D.

## 2010-05-17 NOTE — Op Note (Signed)
NAMELYNDALL, WINDT NO.:  [id number]   MEDICAL RECORD NO.:  [id number]          PATIENT TYPE:  INP   LOCATION:  5702                         FACILITY:  [facility]   PHYSICIAN:  Leslye Peer, MD    DATE OF BIRTH:  01/14/1980   DATE OF PROCEDURE:  06/15/2006  DATE OF DISCHARGE:                               OPERATIVE REPORT   PROCEDURE:  Fiberoptic bronchoscopy with bronchoalveolar lavage.   OPERATOR:  Leslye Peer, M.D.   INDICATION:  Bilateral infiltrates in an immunosuppressed patient.   MEDICATIONS GIVEN:  1. 50 mcg of fentanyl IV in divided doses, 5 mg Versed in divided      doses.  2. Lidocaine 1% to the bronchoalveolar tree for 25 mL total.   CONSENT:  Informed consent was obtained from the patient using a  translator who spoke [place].  A signed copy is on his hospital chart.   PROCEDURE DETAILS:  After consent was obtained and conscious sedation  was initiated as outlined above, the fiberoptic bronchoscope was  introduced to the oropharynx without difficulty.  The cords were  anesthetized with lidocaine and the trachea was intubated.  The trachea,  main carina, and bilateral main stem bronchi were normal in appearance.  The right side was inspected and all lobar and segmental airways were  normal including the right upper lobe, right middle lobe, and right  lower lobe bronchi.  Attention was then turned to the left sided exam.  The left sided airways were also normal including the left upper lobe,  lingula, and left lower lobe bronchi.  There were no abnormal  endobronchial lesions or secretions.  Bronchoalveolar lavage was  performed from the left upper lobe with 50 mL of normal saline instilled  and 21 mL returned.  This will be sent for microbiology as detailed  below. The patient tolerated the procedure well.  He was tachycardiac  before the procedure was initiated with sinus tachycardia in the 140s.  After conscious sedation was initiated, he  improved into the 130s but he  remained tachycardia throughout the entire procedure.  His oxygenation  was stable throughout the entire procedure.   SPECIMENS:  Left upper lobe bronchoalveolar lavage to be sent for  bacterial, viral, fungal, and AFB smears and culture and also for DFA  for PCP.  The fluid will also be sent for cytology.   PLANS:  Will follow up [facility] microbiology when it becomes available  and tailor his antimicrobial therapy appropriately.      Leslye Peer, MD  Electronically Signed     RSB/MEDQ  D:  06/15/2006  T:  06/16/2006  Job:  [id number]
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
## 2010-05-17 NOTE — Discharge Summary (Signed)
NAMEDREUX, MCGROARTY                ACCOUNT NO.:  [id number]   MEDICAL RECORD NO.:  [id number]          PATIENT TYPE:  INP   LOCATION:  2903                         FACILITY:  [facility]   PHYSICIAN:  Ladell Pier, M.D.   DATE OF BIRTH:  1980-05-04   DATE OF ADMISSION:  06/10/2006  DATE OF DISCHARGE:                               DISCHARGE SUMMARY   DISCHARGE DATE:  To be determined.   DISCHARGE DIAGNOSES:  1. Respiratory failure.  On BIPAP and Solu-Medrol.  Question of      etiology.  Bronch results negative thus far.  2. AIDS with CD4 count less than 10, and viral load 64,300.  3. Fever.  High fevers up to 105, improving.  4. Pancytopenia.  5. Hypotension.  6. Diffuse macular rash.  7. Abnormal liver function test.  8. Hyperglycemia secondary to steroids.   DISCHARGE MEDICATIONS:  To be determined.   CONSULTANTS:  Pulmonary, CCM, infectious disease.   PROCEDURES:  1. Bronchoscopy done on June 15, 2006.  2. Chest x-ray showed bilateral airspace infiltrates that have not      improved.   HISTORY OF PRESENT ILLNESS:  Patient is a 30 year old [place] male who  has lived in [place] for at least 3 years, previously was living  in [place].  The patient presented with a 6-weeks history of cough with  no hemoptysis.  He also complained of fatigue.  He has no history of  STDs.  He has not been tested for HIV in the past.  Past medical  history, family history, social history, meds, allergies, review of  systems per admission H and P.   PHYSICAL EXAMINATION:  VITAL SIGNS:  Temperature 98.2, pulse 86,  respirations 28, blood pressure 109/65, pulse ox 92% on BIPAP.  HEENT:  Head is normocephalic, atraumatic.  Pupils reactive to light.  Throat without erythema.  CARDIOVASCULAR:  Tachycardic.  LUNGS:  Rhonchi throughout.  ABDOMEN:  Soft, nontender.  Positive bowel sounds.  EXTREMITIES:  Without edema.  SKIN:  Diffuse macular rash.   HOSPITAL COURSE:  1. Respiratory failure:   The patient was first on a regular floor in      isolation secondary to being ruled out for TB.  He, however,      developed worsening of his chest x-ray and respiratory rate up into      the 60s, and tachycardia up into the 130s/140s.  With high fevers      up to 105.  The patient was transferred to the ICU unit.  CCM was      consulted.  The patient is on several antibiotics, followed by      infectious disease.  He, however, continued to worsen.  However      today, he has shown some improvement with his heart rate and also      with his respiratory rate.  He was ruled out for TB with a negative      bronch and negative respiratory sample.  His culture is still      however pending for the next 6 weeks.  Will continue  current      therapy.  2. Pancytopenia:  The patient had pancytopenia, especially with low      platelets that have been falling.  Per discussion with infectious      disease, this could be secondary to a CMV infection of the bone      marrow causing a drop in platelets.  The patient was started on      therapy for CMV and also hopefully the steroid therapy will help.      He will be monitored.  If continues to fall, will check a DIC panel      and most likely will get hematology involved.  3. Hypotension:  The patient continues to be hypotensive, receiving      several boluses of fluids.  He had a CD arterial line placed by      pulmonary.  CDP is now running 10 to 12.  IV fluids decreased to      150 an hour.  4. Rash:  Not sure of the etiology of the rash.  ID could be secondary      to disseminated zoster.  But unsure.  Will continue to monitor.  5. Abnormal liver function test:  Liver function is abnormal.  Could      be secondary to hypotension.  Will continue to monitor his liver      functions.  6. Hyperglycemia:  When he was started on Depo-Medrol, Solu-Medrol,      his blood sugars started increasing.  Will put him on sliding      scale.   DISCHARGE LABS:   Respiratory culture negative.  BMP:  Sodium 132,  potassium 3.8, chloride 101, CO2 24, BUN 5, creatinine 0.47, glucose  201.  WBC 128, hemoglobin 10.3, platelets 40,000.      Ladell Pier, M.D.  Electronically Signed     N[place]/MEDQ  D:  06/19/2006  T:  06/19/2006  Job:  [id number]

## 2010-07-18 ENCOUNTER — Other Ambulatory Visit: Payer: Self-pay

## 2010-07-18 DIAGNOSIS — B20 Human immunodeficiency virus [HIV] disease: Secondary | ICD-10-CM

## 2010-07-18 LAB — URINALYSIS
Bilirubin Urine: NEGATIVE
Glucose, UA: NEGATIVE mg/dL
Ketones, ur: NEGATIVE mg/dL
Nitrite: NEGATIVE
Specific Gravity, Urine: 1.021 (ref 1.005–1.030)
pH: 7.5 (ref 5.0–8.0)

## 2010-07-19 LAB — CBC WITH DIFFERENTIAL/PLATELET
HCT: 46.6 % (ref 39.0–52.0)
Hemoglobin: 15.7 g/dL (ref 13.0–17.0)
Lymphs Abs: 2 10*3/uL (ref 0.7–4.0)
MCH: 30.4 pg (ref 26.0–34.0)
MCHC: 33.7 g/dL (ref 30.0–36.0)
Monocytes Absolute: 0.5 10*3/uL (ref 0.1–1.0)
Monocytes Relative: 6 % (ref 3–12)
Neutro Abs: 4.6 10*3/uL (ref 1.7–7.7)
Neutrophils Relative %: 63 % (ref 43–77)
RBC: 5.16 MIL/uL (ref 4.22–5.81)

## 2010-07-19 LAB — HIV-1 RNA QUANT-NO REFLEX-BLD
HIV 1 RNA Quant: <20 copies/mL (ref <20)
HIV-1 RNA Quant, Log: <1.3 {Log} (ref <1.30)

## 2010-07-19 LAB — COMPLETE METABOLIC PANEL WITH GFR
ALT: 53 U/L (ref 0–53)
AST: 35 U/L (ref 0–37)
Albumin: 4.5 g/dL (ref 3.5–5.2)
CO2: 22 mEq/L (ref 19–32)
Calcium: 9.3 mg/dL (ref 8.4–10.5)
Chloride: 105 mEq/L (ref 96–112)
GFR, Est African American: >60 mL/min (ref >60)
Potassium: 4.1 mEq/L (ref 3.5–5.3)
Total Protein: 7.3 g/dL (ref 6.0–8.3)

## 2010-07-19 LAB — T-HELPER CELL (CD4) - (RCID CLINIC ONLY): CD4 T Cell Abs: 310 uL — ABNORMAL LOW (ref 400–2700)

## 2010-08-01 ENCOUNTER — Ambulatory Visit (INDEPENDENT_AMBULATORY_CARE_PROVIDER_SITE_OTHER): Payer: Self-pay | Admitting: Internal Medicine

## 2010-08-01 ENCOUNTER — Encounter: Payer: Self-pay | Admitting: Internal Medicine

## 2010-08-01 VITALS — BP 110/69 | HR 58 | Temp 98.6°F | Ht 66.0 in | Wt 220.0 lb

## 2010-08-01 DIAGNOSIS — Z79899 Other long term (current) drug therapy: Secondary | ICD-10-CM

## 2010-08-01 DIAGNOSIS — B20 Human immunodeficiency virus [HIV] disease: Secondary | ICD-10-CM

## 2010-08-01 MED ORDER — ATAZANAVIR SULFATE 300 MG PO CAPS: 300.0000 mg | ORAL_CAPSULE | Freq: Every day | ORAL | Status: DC

## 2010-08-01 MED ORDER — RITONAVIR 100 MG PO TABS: 100.0000 mg | ORAL_TABLET | Freq: Every day | ORAL | Status: DC

## 2010-08-01 MED ORDER — EMTRICITABINE-TENOFOVIR DF 200-300 MG PO TABS: 1.0000 | ORAL_TABLET | Freq: Every day | ORAL | Status: DC

## 2010-08-01 NOTE — Assessment & Plan Note (Addendum)
stalbe on current regimen.  Had a detectable virus in 2010 of about 100 but undetectable since.  Tolerates meds well.  Will check FLP next visit and routing labs.  Will have him return in 4 months since he missed 1 year.  Reyataz, norvir, Truvada refilled.

## 2010-08-01 NOTE — Progress Notes (Signed)
  Subjective:    Patient ID: David Knapp, male    DOB: Jul 20, 1980, 30 y.o.   MRN: [id number]  HPIHere for routine follow up for HIV.  Doing well and reports continued 100% compliance.  Virus remains undetectable despite no recent follow up for about 1 year.  He has not had any complications or hospitalizations.  He denies smoking and is active during his work.  CD4 stable.     Review of Systems  Constitutional: Negative.   HENT: Negative.   Eyes: Negative.   Respiratory: Negative.   Cardiovascular: Negative.   Gastrointestinal: Negative.   Genitourinary: Negative.   Musculoskeletal: Negative.   Skin:       +warts  Neurological: Negative.   Hematological: Negative.   Psychiatric/Behavioral: Negative.        Objective:   Physical Exam  Constitutional: He is oriented to person, place, and time. He appears well-developed and well-nourished. No distress.  HENT:  Mouth/Throat: No oropharyngeal exudate.  Eyes: No scleral icterus.  Cardiovascular: Normal rate, regular rhythm and normal heart sounds.   No murmur heard. Pulmonary/Chest: Effort normal and breath sounds normal. No respiratory distress. He has no wheezes.  Abdominal: Soft. Bowel sounds are normal. He exhibits no distension. There is no tenderness.       obese  Lymphadenopathy:    He has no cervical adenopathy.  Neurological: He is alert and oriented to person, place, and time.  Skin: Skin is warm and dry. No erythema.       + multiple wart-like lesions on hands and arms  Psychiatric: He has a normal mood and affect. His behavior is normal.          Assessment & Plan:   No problem-specific assessment & plan notes found for this encounter.

## 2010-09-22 LAB — T-HELPER CELL (CD4) - (RCID CLINIC ONLY)
CD4 % Helper T Cell: 3 — ABNORMAL LOW
CD4 T Cell Abs: 10 — ABNORMAL LOW

## 2010-09-29 LAB — T-HELPER CELL (CD4) - (RCID CLINIC ONLY): CD4 % Helper T Cell: 11 — ABNORMAL LOW

## 2010-10-13 LAB — T-HELPER CELL (CD4) - (RCID CLINIC ONLY): CD4 % Helper T Cell: 2 — ABNORMAL LOW

## 2010-10-18 LAB — BASIC METABOLIC PANEL
Chloride: 105
GFR calc Af Amer: >60
Potassium: 4.6

## 2010-10-18 LAB — COMPREHENSIVE METABOLIC PANEL
ALT: 131 — ABNORMAL HIGH
ALT: 141 — ABNORMAL HIGH
ALT: 147 — ABNORMAL HIGH
ALT: 150 — ABNORMAL HIGH
ALT: 172 — ABNORMAL HIGH
ALT: 50
AST: 48 — ABNORMAL HIGH
AST: 52 — ABNORMAL HIGH
AST: 53 — ABNORMAL HIGH
AST: 53 — ABNORMAL HIGH
AST: 59 — ABNORMAL HIGH
AST: 86 — ABNORMAL HIGH
Albumin: 1.8 — ABNORMAL LOW
Albumin: 2.1 — ABNORMAL LOW
Albumin: 2.3 — ABNORMAL LOW
Albumin: 2.3 — ABNORMAL LOW
Albumin: 2.4 — ABNORMAL LOW
Albumin: 2.5 — ABNORMAL LOW
Alkaline Phosphatase: 215 — ABNORMAL HIGH
Alkaline Phosphatase: 247 — ABNORMAL HIGH
Alkaline Phosphatase: 254 — ABNORMAL HIGH
Alkaline Phosphatase: 279 — ABNORMAL HIGH
Alkaline Phosphatase: 286 — ABNORMAL HIGH
Alkaline Phosphatase: 360 — ABNORMAL HIGH
BUN: 16
BUN: 18
BUN: 19
BUN: 20
BUN: 22
BUN: 23
BUN: 24 — ABNORMAL HIGH
BUN: 25 — ABNORMAL HIGH
CO2: 22
CO2: 24
CO2: 24
CO2: 25
CO2: 25
CO2: 26
Calcium: 8.2 — ABNORMAL LOW
Calcium: 8.7
Calcium: 8.7
Calcium: 8.7
Chloride: 105
Chloride: 106
Chloride: 107
Chloride: 107
Chloride: 109
Chloride: 114 — ABNORMAL HIGH
Creatinine, Ser: 0.55
Creatinine, Ser: 0.56
Creatinine, Ser: 0.61
Creatinine, Ser: 0.76
Creatinine, Ser: 0.81
Creatinine, Ser: 0.93
GFR calc Af Amer: >60
GFR calc Af Amer: >60
GFR calc Af Amer: >60
GFR calc Af Amer: >60
GFR calc Af Amer: >60
GFR calc Af Amer: >60
GFR calc non Af Amer: >60
GFR calc non Af Amer: >60
GFR calc non Af Amer: >60
GFR calc non Af Amer: >60
GFR calc non Af Amer: >60
GFR calc non Af Amer: >60
Glucose, Bld: 108 — ABNORMAL HIGH
Glucose, Bld: 127 — ABNORMAL HIGH
Glucose, Bld: 144 — ABNORMAL HIGH
Glucose, Bld: 167 — ABNORMAL HIGH
Glucose, Bld: 213 — ABNORMAL HIGH
Glucose, Bld: 98
Potassium: 3.6
Potassium: 3.6
Potassium: 4
Potassium: 4
Potassium: 4.2
Potassium: 4.4
Sodium: 135
Sodium: 138
Sodium: 138
Sodium: 139
Sodium: 139
Sodium: 139
Total Bilirubin: 0.4
Total Bilirubin: 0.5
Total Bilirubin: 0.6
Total Bilirubin: 0.7
Total Bilirubin: 0.7
Total Bilirubin: 0.7
Total Bilirubin: 0.7
Total Protein: 5.3 — ABNORMAL LOW
Total Protein: 5.5 — ABNORMAL LOW
Total Protein: 5.7 — ABNORMAL LOW
Total Protein: 5.7 — ABNORMAL LOW
Total Protein: 5.7 — ABNORMAL LOW
Total Protein: 5.7 — ABNORMAL LOW
Total Protein: 6.1

## 2010-10-18 LAB — CBC
HCT: 27.3 — ABNORMAL LOW
HCT: 32.2 — ABNORMAL LOW
HCT: 32.5 — ABNORMAL LOW
HCT: 32.8 — ABNORMAL LOW
HCT: 33.8 — ABNORMAL LOW
HCT: 33.9 — ABNORMAL LOW
HCT: 34.9 — ABNORMAL LOW
Hemoglobin: 10.7 — ABNORMAL LOW
Hemoglobin: 11.1 — ABNORMAL LOW
Hemoglobin: 11.3 — ABNORMAL LOW
Hemoglobin: 9.1 — ABNORMAL LOW
Hemoglobin: 9.4 — ABNORMAL LOW
MCHC: 33.1
MCHC: 33.2
MCHC: 33.4
MCHC: 33.4
MCHC: 33.7
MCV: 84.9
MCV: 86.4
MCV: 87
MCV: 88.3
MCV: 88.9
MCV: 89.6
MCV: 90.1
Platelets: 182
Platelets: 215
Platelets: 216
Platelets: 235
Platelets: 251
Platelets: 276
RBC: 3.22 — ABNORMAL LOW
RBC: 3.7 — ABNORMAL LOW
RBC: 3.71 — ABNORMAL LOW
RBC: 3.77 — ABNORMAL LOW
RBC: 3.85 — ABNORMAL LOW
RBC: 3.92 — ABNORMAL LOW
RDW: 22 — ABNORMAL HIGH
RDW: 25.1 — ABNORMAL HIGH
RDW: 25.5 — ABNORMAL HIGH
RDW: 25.8 — ABNORMAL HIGH
RDW: 26.7 — ABNORMAL HIGH
RDW: 27.8 — ABNORMAL HIGH
RDW: 28 — ABNORMAL HIGH
WBC: 10.8 — ABNORMAL HIGH
WBC: 11.7 — ABNORMAL HIGH
WBC: 12.6 — ABNORMAL HIGH
WBC: 13.5 — ABNORMAL HIGH
WBC: 2.1 — ABNORMAL LOW
WBC: 7.3
WBC: 8

## 2010-10-18 LAB — HEPATIC FUNCTION PANEL
ALT: 154 — ABNORMAL HIGH
AST: 79 — ABNORMAL HIGH
Albumin: 2 — ABNORMAL LOW
Alkaline Phosphatase: 261 — ABNORMAL HIGH
Bilirubin, Direct: 0.1
Indirect Bilirubin: 0.5
Total Bilirubin: 0.6
Total Protein: 5.4 — ABNORMAL LOW

## 2010-10-18 LAB — CARDIAC PANEL(CRET KIN+CKTOT+MB+TROPI)
CK, MB: 0.8
Total CK: <7 — ABNORMAL LOW

## 2010-10-18 LAB — MAGNESIUM
Magnesium: 2.1
Magnesium: 2.5

## 2010-10-18 LAB — HEPATITIS PANEL, ACUTE
HCV Ab: NEGATIVE
Hep A IgM: NEGATIVE
Hep B C IgM: NEGATIVE
Hepatitis B Surface Ag: NEGATIVE

## 2010-10-18 LAB — PROTIME-INR
INR: 1
Prothrombin Time: 13.2

## 2010-10-19 LAB — APTT: aPTT: 46 — ABNORMAL HIGH

## 2010-10-19 LAB — POCT I-STAT 3, ART BLOOD GAS (G3+)
Acid-base deficit: 1
Acid-base deficit: 3 — ABNORMAL HIGH
Acid-base deficit: 4 — ABNORMAL HIGH
Bicarbonate: 19.8 — ABNORMAL LOW
Bicarbonate: 22.4
O2 Saturation: 91
O2 Saturation: 95
Operator id: 267401
Operator id: 273391
Operator id: 281201
Patient temperature: 100
TCO2: 21
TCO2: 23
pCO2 arterial: 28.9 — ABNORMAL LOW
pH, Arterial: 7.436
pO2, Arterial: 61 — ABNORMAL LOW
pO2, Arterial: 68 — ABNORMAL LOW
pO2, Arterial: 78 — ABNORMAL LOW

## 2010-10-19 LAB — COMPREHENSIVE METABOLIC PANEL
ALT: 37
ALT: 39
ALT: 53
AST: 37
AST: 38 — ABNORMAL HIGH
AST: 80 — ABNORMAL HIGH
Albumin: 1 — ABNORMAL LOW
Albumin: 1.1 — ABNORMAL LOW
Alkaline Phosphatase: 123 — ABNORMAL HIGH
Alkaline Phosphatase: 214 — ABNORMAL HIGH
Alkaline Phosphatase: 385 — ABNORMAL HIGH
BUN: 10
BUN: 5 — ABNORMAL LOW
BUN: 5 — ABNORMAL LOW
CO2: 20
CO2: 23
CO2: 24
Calcium: 7.6 — ABNORMAL LOW
Calcium: 7.8 — ABNORMAL LOW
Calcium: 7.8 — ABNORMAL LOW
Chloride: 106
Chloride: 109
Creatinine, Ser: 0.32 — ABNORMAL LOW
Creatinine, Ser: 0.47
GFR calc Af Amer: >60
GFR calc Af Amer: >60
GFR calc Af Amer: >60
GFR calc non Af Amer: >60
GFR calc non Af Amer: >60
GFR calc non Af Amer: >60
Glucose, Bld: 61 — ABNORMAL LOW
Glucose, Bld: 90
Potassium: 3.3 — ABNORMAL LOW
Potassium: 3.6
Potassium: 3.6
Sodium: 136
Sodium: 136
Sodium: 137
Total Bilirubin: 0.6
Total Bilirubin: 0.7
Total Bilirubin: 0.8
Total Protein: 4.6 — ABNORMAL LOW
Total Protein: 5 — ABNORMAL LOW

## 2010-10-19 LAB — CBC
HCT: 23.4 — ABNORMAL LOW
HCT: 26.1 — ABNORMAL LOW
HCT: 30 — ABNORMAL LOW
HCT: 30.3 — ABNORMAL LOW
HCT: 32.4 — ABNORMAL LOW
Hemoglobin: 10.2 — ABNORMAL LOW
Hemoglobin: 10.3 — ABNORMAL LOW
Hemoglobin: 8.8 — ABNORMAL LOW
Hemoglobin: 9 — ABNORMAL LOW
MCHC: 33.6
MCHC: 33.7
MCHC: 33.8
MCHC: 33.8
MCHC: 33.8
MCHC: 34
MCHC: 34
MCV: 81.4
MCV: 81.6
MCV: 83.8
MCV: 84.5
Platelets: 121 — ABNORMAL LOW
Platelets: 34 — CL
Platelets: 53 — ABNORMAL LOW
Platelets: 78 — ABNORMAL LOW
RBC: 3.09 — ABNORMAL LOW
RBC: 3.09 — ABNORMAL LOW
RBC: 3.12 — ABNORMAL LOW
RBC: 3.56 — ABNORMAL LOW
RBC: 3.72 — ABNORMAL LOW
RDW: 15.4 — ABNORMAL HIGH
RDW: 17.1 — ABNORMAL HIGH
RDW: 20.3 — ABNORMAL HIGH
RDW: 20.9 — ABNORMAL HIGH
WBC: 2.5 — ABNORMAL LOW
WBC: 4.4

## 2010-10-19 LAB — BASIC METABOLIC PANEL
BUN: 5 — ABNORMAL LOW
CO2: 22
CO2: 23
CO2: 24
Calcium: 7.4 — ABNORMAL LOW
Calcium: 7.8 — ABNORMAL LOW
Calcium: 8.2 — ABNORMAL LOW
Chloride: 101
Chloride: 110
Creatinine, Ser: 0.7
GFR calc Af Amer: >60
GFR calc Af Amer: >60
GFR calc Af Amer: >60
GFR calc non Af Amer: >60
GFR calc non Af Amer: >60
Glucose, Bld: 201 — ABNORMAL HIGH
Glucose, Bld: 93
Glucose, Bld: 98
Potassium: 3.7
Potassium: 3.8
Potassium: 4.1
Sodium: 132 — ABNORMAL LOW
Sodium: 136
Sodium: 137

## 2010-10-19 LAB — EXPECTORATED SPUTUM ASSESSMENT W GRAM STAIN, RFLX TO RESP C

## 2010-10-19 LAB — DIFFERENTIAL
Basophils Relative: 0
Basophils Relative: 0
Eosinophils Relative: 1
Eosinophils Relative: 1
Lymphocytes Relative: 2 — ABNORMAL LOW
Lymphs Abs: 0.1 — ABNORMAL LOW
Monocytes Absolute: 0.1 — ABNORMAL LOW
Neutrophils Relative %: 95 — ABNORMAL HIGH
WBC Morphology: INCREASED

## 2010-10-19 LAB — FERRITIN: Ferritin: 8867 — ABNORMAL HIGH (ref 22–322)

## 2010-10-19 LAB — BLOOD GAS, ARTERIAL
Acid-base deficit: 0.6
Bicarbonate: 21
Bicarbonate: 21.6
O2 Content: 6
O2 Saturation: 88.6
O2 Saturation: 94.6
PEEP: 8
PEEP: 8
Patient temperature: 98.6
Patient temperature: 98.6
Pressure support: 12
TCO2: 24.3
pCO2 arterial: 35.6
pH, Arterial: 7.409
pH, Arterial: 7.472 — ABNORMAL HIGH
pO2, Arterial: 61.7 — ABNORMAL LOW
pO2, Arterial: 61.9 — ABNORMAL LOW
pO2, Arterial: 79.3 — ABNORMAL LOW

## 2010-10-19 LAB — HEPATIC FUNCTION PANEL
ALT: 52
Albumin: 1.6 — ABNORMAL LOW
Alkaline Phosphatase: 129 — ABNORMAL HIGH
Total Protein: 5.5 — ABNORMAL LOW

## 2010-10-19 LAB — CARDIAC PANEL(CRET KIN+CKTOT+MB+TROPI)
CK, MB: 1
Relative Index: INVALID
Relative Index: INVALID
Total CK: 14
Troponin I: 0.09 — ABNORMAL HIGH

## 2010-10-19 LAB — IRON AND TIBC
Iron: 10 — ABNORMAL LOW
Saturation Ratios: 7 — ABNORMAL LOW
TIBC: 147 — ABNORMAL LOW
UIBC: 137

## 2010-10-19 LAB — TSH: TSH: 5.634 — ABNORMAL HIGH

## 2010-10-19 LAB — CROSSMATCH

## 2010-10-19 LAB — MAGNESIUM: Magnesium: 1.8

## 2010-10-19 LAB — CULTURE, BLOOD (ROUTINE X 2): Culture: NO GROWTH

## 2010-10-19 LAB — FUNGUS CULTURE, BLOOD: Culture: NO GROWTH

## 2010-10-19 LAB — ACTH STIMULATION, 3 TIME POINTS: Cortisol, 60 Min: 26.7 (ref >20)

## 2010-10-19 LAB — PROTIME-INR
INR: 1.2
INR: 1.3
Prothrombin Time: 16.5 — ABNORMAL HIGH

## 2010-10-19 LAB — LACTATE DEHYDROGENASE: LDH: 390 — ABNORMAL HIGH

## 2010-10-19 LAB — TECHNOLOGIST SMEAR REVIEW: Path Review: INCREASED

## 2010-10-19 LAB — CORTISOL: Cortisol, Plasma: 13.7

## 2010-10-19 LAB — URINE CULTURE

## 2010-10-20 LAB — COMPREHENSIVE METABOLIC PANEL
ALT: 186 — ABNORMAL HIGH
ALT: 214 — ABNORMAL HIGH
AST: 372 — ABNORMAL HIGH
Albumin: 1.7 — ABNORMAL LOW
Alkaline Phosphatase: 103
Alkaline Phosphatase: 133 — ABNORMAL HIGH
CO2: 27
Chloride: 97
GFR calc Af Amer: >60
GFR calc non Af Amer: >60
Glucose, Bld: 112 — ABNORMAL HIGH
Potassium: 3.7
Potassium: 3.8
Sodium: 130 — ABNORMAL LOW
Sodium: 134 — ABNORMAL LOW
Total Bilirubin: 1.2
Total Protein: 6.1

## 2010-10-20 LAB — BASIC METABOLIC PANEL
BUN: 4 — ABNORMAL LOW
BUN: 4 — ABNORMAL LOW
BUN: 5 — ABNORMAL LOW
BUN: 5 — ABNORMAL LOW
CO2: 21
Calcium: 7.3 — ABNORMAL LOW
Calcium: 7.4 — ABNORMAL LOW
Calcium: 7.5 — ABNORMAL LOW
Chloride: 107
Creatinine, Ser: 0.67
Creatinine, Ser: 0.83
GFR calc Af Amer: >60
GFR calc non Af Amer: >60
GFR calc non Af Amer: >60
GFR calc non Af Amer: >60
Glucose, Bld: 121 — ABNORMAL HIGH
Glucose, Bld: 87
Potassium: 3.2 — ABNORMAL LOW
Potassium: 3.3 — ABNORMAL LOW

## 2010-10-20 LAB — CBC
HCT: 24.5 — ABNORMAL LOW
HCT: 26.3 — ABNORMAL LOW
HCT: 27.2 — ABNORMAL LOW
HCT: 28.6 — ABNORMAL LOW
Hemoglobin: 11 — ABNORMAL LOW
Hemoglobin: 11.9 — ABNORMAL LOW
Hemoglobin: 8.4 — ABNORMAL LOW
Hemoglobin: 9.2 — ABNORMAL LOW
MCHC: 34.2
MCHC: 34.5
MCV: 79.6
Platelets: 140 — ABNORMAL LOW
Platelets: 38 — CL
Platelets: 47 — CL
Platelets: 61 — ABNORMAL LOW
RBC: 3.39 — ABNORMAL LOW
RBC: 3.61 — ABNORMAL LOW
RBC: 4.35
RDW: 14.6 — ABNORMAL HIGH
RDW: 15.1 — ABNORMAL HIGH
RDW: 15.3 — ABNORMAL HIGH
RDW: 15.4 — ABNORMAL HIGH
RDW: 15.6 — ABNORMAL HIGH
WBC: 2.7 — ABNORMAL LOW
WBC: 3.3 — ABNORMAL LOW
WBC: 3.5 — ABNORMAL LOW
WBC: 3.6 — ABNORMAL LOW

## 2010-10-20 LAB — CULTURE, BLOOD (ROUTINE X 2)
Culture: NO GROWTH
Culture: NO GROWTH
Culture: NO GROWTH

## 2010-10-20 LAB — T-HELPER CELLS (CD4) COUNT (NOT AT ARMC)
CD4 % Helper T Cell: 9 — ABNORMAL LOW
CD4 T Cell Abs: <10 — ABNORMAL LOW

## 2010-10-20 LAB — BLOOD GAS, ARTERIAL
Acid-base deficit: 2.9 — ABNORMAL HIGH
Acid-base deficit: 4.9 — ABNORMAL HIGH
Bicarbonate: 19 — ABNORMAL LOW
O2 Content: 100
O2 Saturation: 95.7
Patient temperature: 101.8
Patient temperature: 101.9
TCO2: 19.9
TCO2: 20.7
pCO2 arterial: 28.7 — ABNORMAL LOW
pO2, Arterial: 97.5

## 2010-10-20 LAB — MYCOPLASMA PNEUMONIAE ANTIBODY, IGM: Mycoplasma pneumo IgM: 12 Units/mL (ref <770)

## 2010-10-20 LAB — URINALYSIS, ROUTINE W REFLEX MICROSCOPIC
Glucose, UA: NEGATIVE
Hgb urine dipstick: NEGATIVE
Specific Gravity, Urine: 1.027

## 2010-10-20 LAB — LEGIONELLA ANTIGEN, URINE

## 2010-10-20 LAB — AFB CULTURE WITH SMEAR (NOT AT ARMC)

## 2010-10-20 LAB — CULTURE, RESPIRATORY W GRAM STAIN

## 2010-10-20 LAB — URINE MICROSCOPIC-ADD ON

## 2010-10-20 LAB — DIFFERENTIAL
Basophils Relative: 0
Basophils Relative: 1
Eosinophils Absolute: 0
Eosinophils Absolute: 0
Eosinophils Relative: 0
Eosinophils Relative: 0
Lymphs Abs: 0.2 — ABNORMAL LOW
Monocytes Absolute: 0 — ABNORMAL LOW
Monocytes Relative: 1 — ABNORMAL LOW
Neutrophils Relative %: 95 — ABNORMAL HIGH

## 2010-10-20 LAB — P CARINII SMEAR DFA
Pneumocystis carinii DFA: NEGATIVE
Pneumocystis carinii DFA: NEGATIVE

## 2010-10-20 LAB — ROCKY MTN SPOTTED FVR AB, IGM-BLOOD: RMSF IgM: 0.06

## 2010-10-20 LAB — HEPATITIS PANEL, ACUTE
HCV Ab: NEGATIVE
HCV Ab: NEGATIVE
Hep B C IgM: NEGATIVE
Hep B C IgM: NEGATIVE
Hepatitis B Surface Ag: NEGATIVE
Hepatitis B Surface Ag: NEGATIVE

## 2010-10-20 LAB — VIRUS CULTURE: Preliminary Culture: NEGATIVE

## 2010-10-20 LAB — AFB CULTURE, BLOOD

## 2010-10-20 LAB — GC/CHLAMYDIA PROBE AMP, URINE: Chlamydia, Swab/Urine, PCR: NEGATIVE

## 2010-10-20 LAB — FUNGUS CULTURE W SMEAR

## 2010-10-20 LAB — RPR: RPR Ser Ql: NONREACTIVE

## 2010-10-20 LAB — VARICELLA ZOSTER ANTIBODY, IGM: Varicella-Zoster Ab, IgM: <0.91 Index (ref <0.91)

## 2010-10-20 LAB — CMV IGM: CMV IgM: <0.9 Index (ref <0.90)

## 2011-06-01 ENCOUNTER — Telehealth: Payer: Self-pay | Admitting: *Deleted

## 2011-06-01 NOTE — Telephone Encounter (Signed)
LM asking him to call us & make an appt. Also sent to bridge counselling

## 2011-06-07 NOTE — Telephone Encounter (Signed)
Last visit 08/01/10 with Dr. Luciana Axe. I called him again. Sent referral to [facility]

## 2012-05-12 ENCOUNTER — Encounter (HOSPITAL_COMMUNITY): Payer: Self-pay | Admitting: *Deleted

## 2012-05-12 ENCOUNTER — Emergency Department (HOSPITAL_COMMUNITY): Payer: Self-pay

## 2012-05-12 ENCOUNTER — Observation Stay (HOSPITAL_COMMUNITY)
Admission: EM | Admit: 2012-05-12 | Discharge: 2012-05-12 | Disposition: A | Discharge status: Home / Self Care | Payer: MEDICAID | Attending: Internal Medicine | Admitting: Internal Medicine

## 2012-05-12 DIAGNOSIS — R5081 Fever presenting with conditions classified elsewhere: Secondary | ICD-10-CM | POA: Insufficient documentation

## 2012-05-12 DIAGNOSIS — R059 Cough, unspecified: Secondary | ICD-10-CM | POA: Insufficient documentation

## 2012-05-12 DIAGNOSIS — R000 Tachycardia, unspecified: Secondary | ICD-10-CM | POA: Insufficient documentation

## 2012-05-12 DIAGNOSIS — E871 Hypo-osmolality and hyponatremia: Secondary | ICD-10-CM | POA: Insufficient documentation

## 2012-05-12 DIAGNOSIS — D61818 Other pancytopenia: Secondary | ICD-10-CM

## 2012-05-12 DIAGNOSIS — B079 Viral wart, unspecified: Secondary | ICD-10-CM

## 2012-05-12 DIAGNOSIS — R51 Headache: Secondary | ICD-10-CM | POA: Insufficient documentation

## 2012-05-12 DIAGNOSIS — Z91199 Patient's noncompliance with other medical treatment and regimen due to unspecified reason: Secondary | ICD-10-CM | POA: Insufficient documentation

## 2012-05-12 DIAGNOSIS — R509 Fever, unspecified: Secondary | ICD-10-CM

## 2012-05-12 DIAGNOSIS — E8809 Other disorders of plasma-protein metabolism, not elsewhere classified: Secondary | ICD-10-CM | POA: Insufficient documentation

## 2012-05-12 DIAGNOSIS — Z9119 Patient's noncompliance with other medical treatment and regimen: Secondary | ICD-10-CM | POA: Insufficient documentation

## 2012-05-12 DIAGNOSIS — L408 Other psoriasis: Secondary | ICD-10-CM

## 2012-05-12 DIAGNOSIS — R05 Cough: Secondary | ICD-10-CM | POA: Insufficient documentation

## 2012-05-12 DIAGNOSIS — B20 Human immunodeficiency virus [HIV] disease: Principal | ICD-10-CM

## 2012-05-12 HISTORY — DX: Human immunodeficiency virus (HIV) disease: B20

## 2012-05-12 HISTORY — DX: Asymptomatic human immunodeficiency virus (hiv) infection status: Z21

## 2012-05-12 LAB — COMPREHENSIVE METABOLIC PANEL
ALT: 39 U/L (ref 0–53)
AST: 73 U/L — ABNORMAL HIGH (ref 0–37)
CO2: 24 mEq/L (ref 19–32)
Chloride: 101 mEq/L (ref 96–112)
GFR calc non Af Amer: >90 mL/min (ref >90)
Potassium: 3.6 mEq/L (ref 3.5–5.1)
Sodium: 131 mEq/L — ABNORMAL LOW (ref 135–145)
Total Bilirubin: 0.5 mg/dL (ref 0.3–1.2)

## 2012-05-12 LAB — CBC WITH DIFFERENTIAL/PLATELET
Eosinophils Relative: 1 % (ref 0–5)
MCV: 77.6 fL — ABNORMAL LOW (ref 78.0–100.0)
Monocytes Relative: 8 % (ref 3–12)
Neutrophils Relative %: 72 % (ref 43–77)
Platelets: 55 10*3/uL — ABNORMAL LOW (ref 150–400)
RBC: 3.71 MIL/uL — ABNORMAL LOW (ref 4.22–5.81)
WBC: 1.8 10*3/uL — ABNORMAL LOW (ref 4.0–10.5)

## 2012-05-12 LAB — URINALYSIS, ROUTINE W REFLEX MICROSCOPIC
Hgb urine dipstick: NEGATIVE
Nitrite: NEGATIVE
Specific Gravity, Urine: 1.021 (ref 1.005–1.030)
Urobilinogen, UA: 1 mg/dL (ref 0.0–1.0)
pH: 6.5 (ref 5.0–8.0)

## 2012-05-12 MED ORDER — SODIUM CHLORIDE 0.9 % IJ SOLN: 3.0000 mL | Freq: Two times a day (BID) | INTRAMUSCULAR | Status: DC

## 2012-05-12 MED ORDER — SODIUM CHLORIDE 0.9 % IV SOLN
INTRAVENOUS | Status: DC
Start: 2012-05-12 — End: 2012-05-12

## 2012-05-12 MED ORDER — ACETAMINOPHEN 325 MG PO TABS
650.0000 mg | ORAL_TABLET | Freq: Once | ORAL | Status: AC
  Administered 2012-05-12: 650 mg via ORAL

## 2012-05-12 MED ORDER — SODIUM CHLORIDE 0.9 % IV SOLN: 250.0000 mL | INTRAVENOUS | Status: DC | PRN

## 2012-05-12 MED ORDER — SODIUM CHLORIDE 0.9 % IJ SOLN: 3.0000 mL | INTRAMUSCULAR | Status: DC | PRN

## 2012-05-12 NOTE — ED Provider Notes (Signed)
See prior note   Ward Givens, MD 05/12/12 [phone]

## 2012-05-12 NOTE — Discharge Summary (Signed)
Internal Medicine Teaching [facility] Discharge Note  Name: David Knapp MRN: [id number] DOB: 03-28-1980 32 y.o.  Date of Admission: 05/12/2012  6:57 AM Date of Discharge: 05/12/2012 Attending Physician: David Serene, MD  Discharge Diagnosis: Principal Problem:   Pancytopenia Active Problems:   HIV DISEASE   Discharge Medications:   Medication List     As of 05/12/2012  2:16 PM    Notice      You have not been prescribed any medications.         Disposition and follow-up:   David Knapp was discharged from [facility] in Stable condition.  At the hospital follow up visit please address  - His symptom resolution - His HIV medication non-compliance - Please check a CBC due to his pancytopenia in the hospital  Follow-up Appointments: Pt will be contacted by the ID clinic for follow up.   Consultations:    Procedures Performed:  Dg Chest 2 View  05/12/2012  *RADIOLOGY REPORT*  Clinical Data: Fever and cough.  Right-sided chest pain.  CHEST - 2 VIEW  Comparison: 07/09/2006  Findings: The lungs are clear and show no evidence of infiltrate, edema, nodule or pleural fluid.  Cardiac and mediastinal contours within normal limits.  The bony thorax is unremarkable.  IMPRESSION: Normal chest.  No active disease identified.   Original Report Authenticated By: David Knapp, M.D.    Ct Head Wo Contrast  05/12/2012  *RADIOLOGY REPORT*  Clinical Data: Headache, HIV and pancytopenia.  CT HEAD WITHOUT CONTRAST  Technique:  Contiguous axial images were obtained from the base of the skull through the vertex without contrast.  Comparison: 05/03/2005  Findings: The brain demonstrates no evidence of hemorrhage, infarction, edema, mass effect, extra-axial fluid collection, hydrocephalus or mass lesion.  The skull is unremarkable.  IMPRESSION: Normal head CT.   Original Report Authenticated By: David Knapp, M.D.    Admission  History of Present Illness:  32yo M  with PMH HIV off medications x1 yr presents with intermittent fevers, headache, and cough x8 days.  He endorses subjective, intermittent fevers and chills. He denies feeling lightheaded or dizziy, denies syncope, visual disturbances, tinnitus or hearing loss, difficulty speaking or swallowing, neck pain or stiffness, shortness of breath, chest pain, nausea or vomiting, abdominal pain, weakness, or numbness or tingling.  His headache occurs intermittently in his forehead and temples bilaterally. It does not radiate and is not exacerbated or relieved by anything.  Patient states that he has not taken his HIV medications or seen an infectious disease doctor in about 2 years. He was last seen by Dr. Luciana Knapp on 08/01/10, and per Dr. Ephriam Knapp note, the patient was tolerating his HIV regimen of Truvada, Reyataz, and Norvir with an undetectable viral load since 2010. Last CD4 was 310 with viral load <20 on 07/18/10. He states that he stopped taking his medication b/c he moved to [place]. and ultimately seems that he was lost to follow-up.  Review of Systems:  A 10 point ROS was performed; pertinent positives and negatives were noted in the HPI  Physical Exam:  Blood pressure 97/56, pulse 96, temperature 98.1 F (36.7 C), temperature source Oral, resp. rate 20, SpO2 100.00%.  General:Well-developed, and cooperative on examination.  Head: Normocephalic and atraumatic.  Eyes: Pupils equal, round, and reactive to light, no injection and anicteric.  Mouth: Pharynx pink and moist, no erythema or exudates.  Neck: Supple, full ROM. Lungs: CTAB, normal respiratory effort, no accessory muscle use, no crackles, and no  wheezes. Heart: Regular rate, regular rhythm, no murmur, no gallop, and no rub.  Abdomen: Soft, non-tender, non-distended, normal bowel sounds, no guarding, no rebound tenderness, no organomegaly.  Msk: No joint swelling, warmth, or erythema.  Extremities: 2+ radial and DP pulses bilaterally. No cyanosis,  clubbing, edema Neurologic: Alert & oriented X3, cranial nerves II-XII intact, strength normal in all extremities, sensation intact to light touch Skin: Multiple scattered warts on bilateral hands, plaque-like rash on bilateral anterior thighs and left lateral calf. Psych: Normal mood and affect    Hospital Course by problem list: 32yo M with PMH HIV off medications x1 yr presents with intermittent fevers, headache, and cough x8 days.   Neutropenic fever:  Fever of 101 on arrival to the ED. H/o HIV but off anti-virals for at least 1 yr. Last CD4 310, but this was 7/12. There is concern for PCP or MAC, but given normal CXR this is less likely. UA normal so urine source as the cause for fever is less likely.  - CD4/HIV viral load pending - Blood/urine cultures pending - Blood AFB cultures pending  Pancytopenia:  Likely that the pt now has AIDS, given his medication non-compliance for at least 1 year. Possible that he has myelodysplastic syndrome, but this seems less likely given his peripheral smear results. The peripheral smear was showed few platelets and did not comment on RBCs. Likely once he restarts his anti-viral therapy, this will improve. This will need to be monitored as an outpatient. - CBC at f/u   HIV/AIDS:  Previously on HIV regimen of Truvada, Reyataz, and Norvir with undetectable viral load from 2010- 7/12. H/o HIV but off anti-virals for at least 1 yr. Last CD4 310, but this was 7/12. On exam, his lungs are clear and he denies any pain. He does have the warts on both of his hands which were noted in previous clinic noted by Dr. Luciana Knapp. He also has plaque-like areas on the dorsum of his thighs bilaterally and on his left lateral calf that have been present x3 months. The pt is requesting to be discharged home today. Spoke to Dr. Orvan Knapp with Infectious Disease, and he felt that if the pt was willing to f/u in the ID clinic, he could be discharged home today. Discussed the importance  of ID f/u with the pt through the translator phone, and he acknowledged understanding and agreed to f/u in the ID clinic. The cell phone number where he can be reached is [phone]. No anti-virals were started this admission; he will need to get plugged in with the ID clinic 1st.  - CD4 and HIV viral load pending.  - F/u in ID clinic    Discharge Vitals:  BP 97/56  Pulse 96  Temp(Src) 98.1 F (36.7 C) (Oral)  Resp 20  SpO2 100%  Discharge Labs:  Results for orders placed during the hospital encounter of 05/12/12 (from the past 24 hour(s))  CBC WITH DIFFERENTIAL     Status: Abnormal   Collection Time    05/12/12  7:48 AM      Result Value Range   WBC 1.8 (*) 4.0 - 10.5 K/uL   RBC 3.71 (*) 4.22 - 5.81 MIL/uL   Hemoglobin 9.8 (*) 13.0 - 17.0 g/dL   HCT 72.5 (*) 36.6 - 44.0 %   MCV 77.6 (*) 78.0 - 100.0 fL   MCH 26.4  26.0 - 34.0 pg   MCHC 34.0  30.0 - 36.0 g/dL   RDW 34.7  42.5 -  15.5 %   Platelets 55 (*) 150 - 400 K/uL   Neutrophils Relative 72  43 - 77 %   Lymphocytes Relative 19  12 - 46 %   Monocytes Relative 8  3 - 12 %   Eosinophils Relative 1  0 - 5 %   Basophils Relative 0  0 - 1 %   Neutro Abs 1.4 (*) 1.7 - 7.7 K/uL   Lymphs Abs 0.3 (*) 0.7 - 4.0 K/uL   Monocytes Absolute 0.1  0.1 - 1.0 K/uL   Eosinophils Absolute 0.0  0.0 - 0.7 K/uL   Basophils Absolute 0.0  0.0 - 0.1 K/uL   Smear Review MORPHOLOGY UNREMARKABLE    COMPREHENSIVE METABOLIC PANEL     Status: Abnormal   Collection Time    05/12/12  7:48 AM      Result Value Range   Sodium 131 (*) 135 - 145 mEq/L   Potassium 3.6  3.5 - 5.1 mEq/L   Chloride 101  96 - 112 mEq/L   CO2 24  19 - 32 mEq/L   Glucose, Bld 107 (*) 70 - 99 mg/dL   BUN 12  6 - 23 mg/dL   Creatinine, Ser 9.60  0.50 - 1.35 mg/dL   Calcium 7.7 (*) 8.4 - 10.5 mg/dL   Total Protein 7.5  6.0 - 8.3 g/dL   Albumin 2.5 (*) 3.5 - 5.2 g/dL   AST 73 (*) 0 - 37 U/L   ALT 39  0 - 53 U/L   Alkaline Phosphatase 100  39 - 117 U/L   Total Bilirubin 0.5   0.3 - 1.2 mg/dL   GFR calc non Af Amer >90  >90 mL/min   GFR calc Af Amer >90  >90 mL/min  URINALYSIS, ROUTINE W REFLEX MICROSCOPIC     Status: None   Collection Time    05/12/12  8:48 AM      Result Value Range   Color, Urine YELLOW  YELLOW   APPearance CLEAR  CLEAR   Specific Gravity, Urine 1.021  1.005 - 1.030   pH 6.5  5.0 - 8.0   Glucose, UA NEGATIVE  NEGATIVE mg/dL   Hgb urine dipstick NEGATIVE  NEGATIVE   Bilirubin Urine NEGATIVE  NEGATIVE   Ketones, ur NEGATIVE  NEGATIVE mg/dL   Protein, ur NEGATIVE  NEGATIVE mg/dL   Urobilinogen, UA 1.0  0.0 - 1.0 mg/dL   Nitrite NEGATIVE  NEGATIVE   Leukocytes, UA NEGATIVE  NEGATIVE  TECHNOLOGIST SMEAR REVIEW     Status: None   Collection Time    05/12/12 10:04 AM      Result Value Range   Tech Review PLATELETS APPEAR DECREASED      Signed: Genelle Gather 05/12/2012, 2:16 PM   Time Spent on Discharge: 35 min Services Ordered on Discharge: None Equipment Ordered on Discharge: None

## 2012-05-12 NOTE — ED Notes (Signed)
Pt has a urinal and knows we need a urine sample 

## 2012-05-12 NOTE — H&P (Signed)
Internal Medicine Attending Admission Note Date: 05/12/2012  Patient name: David Knapp Medical record number: [id number] Date of birth: 10-10-1980 Age: 32 y.o. Gender: male  I saw and evaluated the patient. I reviewed the resident's note and I agree with the resident's findings and plan as documented in the resident's note.  Chief Complaint(s): Fevers, cough, and headache.  History - key components related to admission:  David Knapp is a 32 y.o. man with history of HIV previously followed in the [facility] until about 2 years ago when he was lost to follow-up.  He subsequently stopped taking his antiretroviral therapy.  He now presents with just over 1 week of fevers, headaches, and cough.  He needs to get plugged back into the [facility] for ongoing care.  Physical Exam - key components related to admission:  Filed Vitals:   05/12/12 0949 05/12/12 1019 05/12/12 1020 05/12/12 1021  BP:  102/61 103/62 97/56  Pulse:  75 91 96  Temp: 98.1 F (36.7 C)     TempSrc: Oral     Resp:    20  SpO2:    100%   Gen: WDWN man lying comfortably in bed in NAD. Lungs: CTA B w/o wheezes, rhonchi, or rales. CV: RRR w/o murmurs, rubs, or gallops Abd: Soft, non-tender, active bowel sounds. Ext: Small warts on both hands, no edema  Lab results:  Basic Metabolic Panel:  Recent Labs  78/29/56 0748  NA 131*  K 3.6  CL 101  CO2 24  GLUCOSE 107*  BUN 12  CREATININE 0.74  CALCIUM 7.7*   Liver Function Tests:  Recent Labs  05/12/12 0748  AST 73*  ALT 39  ALKPHOS 100  BILITOT 0.5  PROT 7.5  ALBUMIN 2.5*   CBC:  Recent Labs  05/12/12 0748  WBC 1.8*  NEUTROABS 1.4*  HGB 9.8*  HCT 28.8*  MCV 77.6*  PLT 55*   Imaging results:  Dg Chest 2 View  05/12/2012  *RADIOLOGY REPORT*  Clinical Data: Fever and cough.  Right-sided chest pain.  CHEST - 2 VIEW  Comparison: 07/09/2006  Findings: The lungs are clear and show no evidence of infiltrate, edema, nodule or pleural fluid.  Cardiac and  mediastinal contours within normal limits.  The bony thorax is unremarkable.  IMPRESSION: Normal chest.  No active disease identified.   Original Report Authenticated By: Irish Lack, M.D.    Ct Head Wo Contrast  05/12/2012  *RADIOLOGY REPORT*  Clinical Data: Headache, HIV and pancytopenia.  CT HEAD WITHOUT CONTRAST  Technique:  Contiguous axial images were obtained from the base of the skull through the vertex without contrast.  Comparison: 05/03/2005  Findings: The brain demonstrates no evidence of hemorrhage, infarction, edema, mass effect, extra-axial fluid collection, hydrocephalus or mass lesion.  The skull is unremarkable.  IMPRESSION: Normal head CT.   Original Report Authenticated By: Irish Lack, M.D.    Assessment & Plan by Problem:  David Knapp is a 32 y.o. Man with a history of HIV who has not been on his retroviral therapy for nearly 2 years and presents with intermittent fevers, headaches, and cough.  He looks well and initial work-up has been unrevealing.  What he needs more than anything is to get plugged back into the [facility] for ongoing care.  1) HIV: Follow-up on CD4 count and cultures.  Discharge home today with follow-up in [facility].  No antibiotics at this time as we do not have a source for his fevers.

## 2012-05-12 NOTE — H&P (Signed)
[facility] Admission Note Date: 05/12/2012  Patient name: David Knapp Medical record number: [id number] Date of birth: 1980/04/12 Age: 32 y.o. Gender: male PCP: No primary provider on file.  Medical Service: Internal Medicine (B1)  Attending physician: Dr. Criselda Peaches     Chief Complaint: Pancytopenic Fever  History of Present Illness: [name] is a 32 yo M with a h/o HIV presenting with fever and HA x 8 days. He reports his HA is worse at night.  He endorses a mild cough but denies neck pain, SOB, abdominal pain, N/V, and diarrhea.  He has not taken antiretroviral medication for at least a year.  Previously took Scientist, research (medical), norvir, and Truvada.  His last CD4 was 310 on 07/17/12. In the ED he was found to be febrile to 101 F and pancytopenic with platelets of 55k, a WBC of 1.8, and an ANC of 1.4.    Meds: No current outpatient prescriptions on file.  Allergies: Allergies as of 05/12/2012  . (No Known Allergies)   History reviewed. No pertinent past medical history. History reviewed. No pertinent past surgical history. No family history on file. History   Social History  . Marital Status: Single    Spouse Name: N/A    Number of Children: N/A  . Years of Education: N/A   Occupational History  . Not on file.   Social History Main Topics  . Smoking status: Never Smoker   . Smokeless tobacco: Never Used  . Alcohol Use: 3.0 oz/week    6 drink(s) per week  . Drug Use: No  . Sexually Active: No   Other Topics Concern  . Not on file   Social History Narrative  . No narrative on file    Review of Systems: A comprehensive review of systems was negative except for: per HPI.  Physical Exam: Blood pressure 97/56, pulse 96, temperature 98.1 F (36.7 C), temperature source Oral, resp. rate 20, SpO2 100.00%. General appearance: alert, cooperative, appears stated age and no distress Head: Normocephalic, without obvious abnormality, atraumatic Eyes: EOMI. Sclera  clear. Throat: lips, mucosa, and tongue normal; teeth and gums normal Neck: no adenopathy, supple, symmetrical, trachea midline and thyroid not enlarged, symmetric, no tenderness/mass/nodules Back: no skin lesions, erythema, or scars Lungs: clear to auscultation bilaterally Heart: regular rate and rhythm, S1, S2 normal, no murmur, click, rub or gallop Abdomen: soft, non-tender; bowel sounds normal; no masses,  no organomegaly Extremities: no cyanosis or edema Pulses: 2+ and symmetric Skin: multiple papular lesions on BL hands, nontender, nonerythematous. Plaque/eczema like rash, dark brown with white streaks, on BL thighs and L lateral thigh, nontender, nonerythemaous.  Neurologic: CN II-XII grossly intact.  Moves all 4 extremities spontaneously.  Negative Kernig and Brudzinski signs.   Lab results: Basic Metabolic Panel:  Recent Labs  91/47/82 0748  NA 131*  K 3.6  CL 101  CO2 24  GLUCOSE 107*  BUN 12  CREATININE 0.74  CALCIUM 7.7*   Liver Function Tests:  Recent Labs  05/12/12 0748  AST 73*  ALT 39  ALKPHOS 100  BILITOT 0.5  PROT 7.5  ALBUMIN 2.5*   No results found for this basename: LIPASE, AMYLASE,  in the last 72 hours No results found for this basename: AMMONIA,  in the last 72 hours CBC:  Recent Labs  05/12/12 0748  WBC 1.8*  NEUTROABS 1.4*  HGB 9.8*  HCT 28.8*  MCV 77.6*  PLT 55*   Cardiac Enzymes: No results found for this basename: CKTOTAL, CKMB,  CKMBINDEX, TROPONINI,  in the last 72 hours BNP: No results found for this basename: PROBNP,  in the last 72 hours D-Dimer: No results found for this basename: DDIMER,  in the last 72 hours CBG: No results found for this basename: GLUCAP,  in the last 72 hours Hemoglobin A1C: No results found for this basename: HGBA1C,  in the last 72 hours Fasting Lipid Panel: No results found for this basename: CHOL, HDL, LDLCALC, TRIG, CHOLHDL, LDLDIRECT,  in the last 72 hours Thyroid Function Tests: No results  found for this basename: TSH, T4TOTAL, FREET4, T3FREE, THYROIDAB,  in the last 72 hours Anemia Panel: No results found for this basename: VITAMINB12, FOLATE, FERRITIN, TIBC, IRON, RETICCTPCT,  in the last 72 hours Coagulation: No results found for this basename: LABPROT, INR,  in the last 72 hours Urine Drug Screen: Drugs of Abuse  No results found for this basename: labopia, cocainscrnur, labbenz, amphetmu, thcu, labbarb    Alcohol Level: No results found for this basename: ETH,  in the last 72 hours Urinalysis:  Recent Labs  05/12/12 0848  COLORURINE YELLOW  LABSPEC 1.021  PHURINE 6.5  GLUCOSEU NEGATIVE  HGBUR NEGATIVE  BILIRUBINUR NEGATIVE  KETONESUR NEGATIVE  PROTEINUR NEGATIVE  UROBILINOGEN 1.0  NITRITE NEGATIVE  LEUKOCYTESUR NEGATIVE     Imaging results:  Dg Chest 2 View  05/12/2012  *RADIOLOGY REPORT*  Clinical Data: Fever and cough.  Right-sided chest pain.  CHEST - 2 VIEW  Comparison: 07/09/2006  Findings: The lungs are clear and show no evidence of infiltrate, edema, nodule or pleural fluid.  Cardiac and mediastinal contours within normal limits.  The bony thorax is unremarkable.  IMPRESSION: Normal chest.  No active disease identified.   Original Report Authenticated By: Irish Lack, M.D.    Ct Head Wo Contrast  05/12/2012  *RADIOLOGY REPORT*  Clinical Data: Headache, HIV and pancytopenia.  CT HEAD WITHOUT CONTRAST  Technique:  Contiguous axial images were obtained from the base of the skull through the vertex without contrast.  Comparison: 05/03/2005  Findings: The brain demonstrates no evidence of hemorrhage, infarction, edema, mass effect, extra-axial fluid collection, hydrocephalus or mass lesion.  The skull is unremarkable.  IMPRESSION: Normal head CT.   Original Report Authenticated By: Irish Lack, M.D.      Assessment & Plan by Problem: 32 yo M with a h/o HIV with pancytopenic fever and HA.    Principal Problem:   Pancytopenia Active Problems:    HIV DISEASE  Pancytopenic Fever -Admit to Internal Medicine (B1) for observation.  -Etiology is likely related to conversion from HIV to AIDS given that he has not taken antiretrovirals for over a year and CD4 in 2012 was 310. No infectious source identified at this time (CXR wnl, Head CT wnl, UA negative). No signs of meningeal irritation at this time.  Malignancy remains a possible cause as well.  -MASCC score of 21 = borderline risk for complications related to neutropenic fever for cancer pts, should there be an underlying malignancy.  Do not include risk associated with being HIV/AIDS +.  -Obtain blood smear. -Urine culture pending. -Blood cultures x 2 pending. Add on AFB.   -CBC w/ diff in AM.   Tachycardia -Likely 2/2 dehydration and fever. -NS @ 100 cc/hr.  Hyponatremia -Asymptomatic. Likely 2/2 dehydration.  -BMP in AM.   HIV -Determine CD4 and viral load.  -Infectious disease consult.   Hypoalbuminemia  -Likely 2/2 chronic disease.  -Corrected Ca = 8.9 -Regular diet.  Prophylaxis: SCDs  Access: L hand PIV (placed 05/12/12)  Code: full  Dispo: floor status -Anticipate discharge tomorrow (05/12/12).  This is a Psychologist, occupational Note.  The care of the patient was discussed with Dr. Dorma Russell and the assessment and plan was formulated with their assistance.  Please see their note for official documentation of the patient encounter.   Signed: Derwood Kaplan 05/12/2012, 10:32 AM

## 2012-05-12 NOTE — ED Provider Notes (Signed)
09:55 Dr Everardo Beals, [facility], unassigned admission, admit to [facility], Dr Josem Kaufmann attending   Devoria Albe, MD, Franz Dell, MD 05/12/12 [phone]

## 2012-05-12 NOTE — ED Provider Notes (Signed)
Patient has HIV however he has not been on his medications for at least a year. He reports 8 days of fever off and on in headaches mainly at night. He states he has a mild cough. He denies headache at this time. He also denies neck pain, chest pain, shortness of breath, abdominal pain, vomiting or diarrhea.  Patient is alert and cooperative. He is noted to move his head freely during course of conversation. He is in no respiratory distress. He is noted to have multiple raised flat lesions on his hands and fingers he states have been there for 2 years.  Pt noted to have a new pancytopenia with platelets of 55K.  He is not neutropenic despite a low total WBC count.   Medical screening examination/treatment/procedure(s) were conducted as a shared visit with non-physician practitioner(s) and myself.  I personally evaluated the patient during the encounter  Devoria Albe, MD, Franz Dell, MD 05/12/12 [phone]

## 2012-05-12 NOTE — H&P (Signed)
Internal Medicine Teaching Service Resident Admission Note Date: 05/12/2012  Patient name: David Knapp Medical record number: [id number] Date of birth: 10/05/1980 Age: 32 y.o. Gender: male PCP: No primary provider on file.  Medical Service:  I have reviewed the note by Gilmer Mor, MS3 and was present during the interview and physical exam.  Please see my separate note for findings, assessment, and plan.   Signed: Genelle Gather 05/12/2012, 2:05 PM

## 2012-05-12 NOTE — H&P (Signed)
Hospital Admission Note Date: 05/12/2012  Patient name: David Knapp Medical record number: [id number] Date of birth: Aug 29, 1980 Age: 32 y.o. Gender: male PCP: No primary provider on file.  Medical Service: Internal Medicine Teaching Service   Attending physician: Dr. Criselda Peaches  1st Contact: Dr. Sherrine Maples    Pager: [phone] 2nd Contact: Dr. Everardo Beals    Pager: [phone]  After 5 pm or weekends:  1st Contact: Pager: [phone]  2nd Contact: Pager: [phone]    Chief Complaint: Intermittent fevers, headache, and cough x8 days  History of Present Illness: 32yo M with PMH HIV off medications x1 yr presents with intermittent fevers, headache, and cough x8 days.   He endorses subjective, intermittent fevers and chills. He denies feeling lightheaded or dizziy, denies syncope, visual disturbances, tinnitus or hearing loss, difficulty speaking or swallowing, neck pain or stiffness, shortness of breath, chest pain, nausea or vomiting, abdominal pain, weakness, or numbness or tingling.  His headache occurs intermittently in his forehead and temples bilaterally. It does not radiate and is not exacerbated or relieved by anything.   Patient states that he has not taken his HIV medications or seen an infectious disease doctor in about 2 years. He was last seen by Dr. Luciana Axe on 08/01/10, and per Dr. Ephriam Knuckles note,  the patient was tolerating his HIV regimen of Truvada, Reyataz, and Norvir with an undetectable viral load since 2010. Last CD4 was 310 with viral load <20 on 07/18/10. He states that he stopped taking his medication b/c he moved to [place]. and ultimately seems that he was lost to follow-up.   Meds: No current outpatient prescriptions on file.  Allergies: Allergies as of 05/12/2012  . (No Known Allergies)   History reviewed. No pertinent past medical history. History reviewed. No pertinent past surgical history. No family history on file. History   Social History  . Marital Status: Single    Spouse  Name: N/A    Number of Children: N/A  . Years of Education: N/A   Occupational History  . Not on file.   Social History Main Topics  . Smoking status: Never Smoker   . Smokeless tobacco: Never Used  . Alcohol Use: 3.0 oz/week    6 drink(s) per week  . Drug Use: No  . Sexually Active: No   Other Topics Concern  . Not on file   Social History Narrative  . No narrative on file    Review of Systems: A 10 point ROS was performed; pertinent positives and negatives were noted in the HPI   Physical Exam: Blood pressure 97/56, pulse 96, temperature 98.1 F (36.7 C), temperature source Oral, resp. rate 20, SpO2 100.00%. General:Well-developed, and cooperative on examination.  Head: Normocephalic and atraumatic.  Eyes: Pupils equal, round, and reactive to light, no injection and anicteric.  Mouth: Pharynx pink and moist, no erythema or exudates.  Neck: Supple, full ROM. Lungs: CTAB, normal respiratory effort, no accessory muscle use, no crackles, and no wheezes. Heart: Regular rate, regular rhythm, no murmur, no gallop, and no rub.  Abdomen: Soft, non-tender, non-distended, normal bowel sounds, no guarding, no rebound tenderness, no organomegaly.  Msk: No joint swelling, warmth, or erythema.  Extremities: 2+ radial and DP pulses bilaterally. No cyanosis, clubbing, edema Neurologic: Alert & oriented X3, cranial nerves II-XII intact, strength normal in all extremities, sensation intact to light touch Skin: Multiple scattered warts on bilateral hands, plaque-like rash on bilateral anterior thighs and left lateral calf. Psych: Normal mood and affect   Lab results: Basic  Metabolic Panel:  Recent Labs  16/10/96 0748  NA 131*  K 3.6  CL 101  CO2 24  GLUCOSE 107*  BUN 12  CREATININE 0.74  CALCIUM 7.7*   Liver Function Tests:  Recent Labs  05/12/12 0748  AST 73*  ALT 39  ALKPHOS 100  BILITOT 0.5  PROT 7.5  ALBUMIN 2.5*   CBC:  Recent Labs  05/12/12 0748  WBC  1.8*  NEUTROABS 1.4*  HGB 9.8*  HCT 28.8*  MCV 77.6*  PLT 55*   Urinalysis:  Recent Labs  05/12/12 0848  COLORURINE YELLOW  LABSPEC 1.021  PHURINE 6.5  GLUCOSEU NEGATIVE  HGBUR NEGATIVE  BILIRUBINUR NEGATIVE  KETONESUR NEGATIVE  PROTEINUR NEGATIVE  UROBILINOGEN 1.0  NITRITE NEGATIVE  LEUKOCYTESUR NEGATIVE    Imaging results:  Dg Chest 2 View  05/12/2012  *RADIOLOGY REPORT*  Clinical Data: Fever and cough.  Right-sided chest pain.  CHEST - 2 VIEW  Comparison: 07/09/2006  Findings: The lungs are clear and show no evidence of infiltrate, edema, nodule or pleural fluid.  Cardiac and mediastinal contours within normal limits.  The bony thorax is unremarkable.  IMPRESSION: Normal chest.  No active disease identified.   Original Report Authenticated By: Irish Lack, M.D.    Ct Head Wo Contrast  05/12/2012  *RADIOLOGY REPORT*  Clinical Data: Headache, HIV and pancytopenia.  CT HEAD WITHOUT CONTRAST  Technique:  Contiguous axial images were obtained from the base of the skull through the vertex without contrast.  Comparison: 05/03/2005  Findings: The brain demonstrates no evidence of hemorrhage, infarction, edema, mass effect, extra-axial fluid collection, hydrocephalus or mass lesion.  The skull is unremarkable.  IMPRESSION: Normal head CT.   Original Report Authenticated By: Irish Lack, M.D.     Other results: EKG: None  Assessment & Plan by Problem: 32yo M with PMH HIV off medications x1 yr presents with intermittent fevers, headache, and cough x8 days.   Neutropenic fever: Fever on arrival to the ED. H/o HIV but off anti-virals for at least 1 yr. Last CD4 310, but this was 7/12. There is concern for PCP or MAC, but given normal CXR this is less likely. UA normal so urine source as the cause for fever is less likely.  - Admit to IMTS to observation - CD4/HIV viral load - Blood/urine cultures - Blood AFB cultures  Pancytopenia: Likely that the pt now has AIDS, given his  medication non-compliance for at least 1 year. Possible that he has myelodysplastic syndrome, but this seems less likely given his peripheral smear results. The peripheral smear was showed few platelets and did not comment on RBCs. Likely once he restarts his anti-viral therapy, this will improve.    HIV/AIDS: Previously on HIV regimen of Truvada, Reyataz, and Norvir with undetectable viral load from 2010- 7/12. H/o HIV but off anti-virals for at least 1 yr. Last CD4 310, but this was 7/12. On exam, his lungs are clear and he denies any pain. He does have the warts on both of his hands which were noted in previous clinic noted by Dr. Luciana Axe. He also has plaque-like areas on the dorsum of his thighs bilaterally and on his left lateral calf that have been present x3 months. The pt is requesting to be discharged home today. Spoke to Dr. Orvan Falconer with Infectious Disease, and he felt that if the pt was willing to f/u in the ID clinic, he could be discharged home today.  Discussed the importance of ID f/u with the pt  through the translator phone, and he acknowledged understanding and agreed to f/u in the ID clinic. The cell phone number where he can be reached is [phone]. No anti-virals were started this admission; he will need to get plugged in with the ID clinic 1st. - Checking CD4 and HIV viral load. - F/u in ID clinic   Dispo: Disposition is deferred at this time, awaiting improvement of current medical problems. Anticipated discharge in approximately 0 day(s). Pt is to be discharged home today and will f./u in ID clinic  The patient does not have a current PCP (No primary provider on file.), therefore will f/u with ID after discharge.   The patient does not have transportation limitations that hinder transportation to clinic appointments.  Signed: Genelle Gather 05/12/2012, 10:47 AM

## 2012-05-12 NOTE — ED Notes (Signed)
Headache for 8 days no n v with an elavted temp.  He alst ahd tylenol yesterday

## 2012-05-12 NOTE — ED Provider Notes (Addendum)
History     CSN: [id number]  Arrival date & time 05/12/12  3086   First MD Initiated Contact with Patient 05/12/12 [phone]      Chief Complaint  Patient presents with  . Headache    (Consider location/radiation/quality/duration/timing/severity/associated sxs/prior treatment) HPI Comments: Patient is a 32 year old male with history of HIV who presents for a headache x8 days. History obtained while using interpreter phone. Patient states the headache is "just a pain" that comments on at night and is not present in the morning or during the day. Patient indicates that the pain is in his forehead in bilateral temples and states that it is not radiating and is without any aggravating or alleviating factors. Patient admits to subjective fever and denies lightheadedness or dizziness, syncope, visual disturbances, tinnitus or hearing loss, difficulty speaking or swallowing, neck pain or stiffness, shortness of breath, chest pain, nausea or vomiting, abdominal pain, weakness, and numbness or tingling in his extremities. Patient states that he is not taking his HIV medications in "quite a while" and states that he has not had followup with an infectious disease doctor in about 2 years. Patient states that he has also not had blood work done for some time; he thinks also 2 years.   There is note from 08/01/2010 from ID which states that patient tolerating current ARV regimen of Truvada, Reyataz, and Norvir with undetectable viral load since 2010. Last recorded undetectable viral load was resulted on 07/18/2010; seen by Dr. Luciana Axe on 08/01/2010  The history is provided by the patient. The history is limited by a language barrier. No language interpreter was used.    History reviewed. No pertinent past medical history.  History reviewed. No pertinent past surgical history.  No family history on file.  History  Substance Use Topics  . Smoking status: Never Smoker   . Smokeless tobacco: Never Used  .  Alcohol Use: 3.0 oz/week    6 drink(s) per week      Review of Systems  Constitutional: Positive for fever.  HENT: Negative for neck pain and neck stiffness.   Eyes: Negative for visual disturbance.  Respiratory: Negative for shortness of breath.   Cardiovascular: Negative for chest pain.  Gastrointestinal: Negative for nausea and vomiting.  Genitourinary: Negative for dysuria and hematuria.  Neurological: Positive for headaches. Negative for syncope.  All other systems reviewed and are negative.    Allergies  Review of patient's allergies indicates no known allergies.  Home Medications   No current outpatient prescriptions on file.  BP 105/58  Pulse 111  Temp(Src) 98.1 F (36.7 C) (Oral)  Resp 20  SpO2 98%  Physical Exam  Nursing note and vitals reviewed. Constitutional: He is oriented to person, place, and time. He appears well-developed and well-nourished. No distress.  HENT:  Head: Normocephalic and atraumatic.  Right Ear: External ear normal.  Left Ear: External ear normal.  Nose: Nose normal.  Mouth/Throat: Oropharynx is clear and moist. No oropharyngeal exudate.  Eyes: Conjunctivae and EOM are normal. Pupils are equal, round, and reactive to light. No scleral icterus.  Neck: Normal range of motion. Neck supple.  Negative Brudzinski's sign; no nuchal rigidity  Cardiovascular: Normal heart sounds and intact distal pulses.   Tachycardic rate; regular rhythm  Pulmonary/Chest: Effort normal and breath sounds normal. No respiratory distress. He has no wheezes. He has no rales.  Abdominal: Soft. He exhibits no distension and no mass. There is no tenderness. There is no rebound and no guarding.  Musculoskeletal: Normal  range of motion. He exhibits no edema and no tenderness.  Lymphadenopathy:    He has no cervical adenopathy.  Neurological: He is alert and oriented to person, place, and time.  CN III-XII grossly intact. Patient exhibits equal grip strength b/l with  5/5 strength against resistance in b/l upper and lower extremities. No sensory or motor deficits. Moves extremities without ataxia. DTRs normal and symmetric.  Skin: Skin is warm. He is diaphoretic. No pallor.  Psychiatric: He has a normal mood and affect. His behavior is normal.    ED Course  Procedures (including critical care time)  Labs Reviewed  CBC WITH DIFFERENTIAL - Abnormal; Notable for the following:    WBC 1.8 (*)    RBC 3.71 (*)    Hemoglobin 9.8 (*)    HCT 28.8 (*)    MCV 77.6 (*)    Platelets 55 (*)    Neutro Abs 1.4 (*)    Lymphs Abs 0.3 (*)    All other components within normal limits  COMPREHENSIVE METABOLIC PANEL - Abnormal; Notable for the following:    Sodium 131 (*)    Glucose, Bld 107 (*)    Calcium 7.7 (*)    Albumin 2.5 (*)    AST 73 (*)    All other components within normal limits  URINE CULTURE  CULTURE, BLOOD (ROUTINE X 2)  CULTURE, BLOOD (ROUTINE X 2)  URINALYSIS, ROUTINE W REFLEX MICROSCOPIC   Dg Chest 2 View  05/12/2012  *RADIOLOGY REPORT*  Clinical Data: Fever and cough.  Right-sided chest pain.  CHEST - 2 VIEW  Comparison: 07/09/2006  Findings: The lungs are clear and show no evidence of infiltrate, edema, nodule or pleural fluid.  Cardiac and mediastinal contours within normal limits.  The bony thorax is unremarkable.  IMPRESSION: Normal chest.  No active disease identified.   Original Report Authenticated By: Irish Lack, M.D.    Ct Head Wo Contrast  05/12/2012  *RADIOLOGY REPORT*  Clinical Data: Headache, HIV and pancytopenia.  CT HEAD WITHOUT CONTRAST  Technique:  Contiguous axial images were obtained from the base of the skull through the vertex without contrast.  Comparison: 05/03/2005  Findings: The brain demonstrates no evidence of hemorrhage, infarction, edema, mass effect, extra-axial fluid collection, hydrocephalus or mass lesion.  The skull is unremarkable.  IMPRESSION: Normal head CT.   Original Report Authenticated By: Irish Lack, M.D.     1. Fever   2. HIV disease   3. Pancytopenia     MDM  Patient is a 32 year old male with a history of HIV who presents for headache x 8 days. Patient states headache comes on at night without aggravating or alleviating factors. He denies any pain at this time. On exam, patient is diaphoretic without focal neurologic deficits. Heart RRR, lungs CTAB, and abdomen NTTP. Patient denies taking his medication for his HIV x 1 year and follow up with ID x 2 years. CBC, CMP, blood cultures, UA, urine culture, CXR, and CT head without contrast ordered for evaluation.  Labs significant for pancytopenia without neutropenia. Blood cultures ordered and are pending. CT head without evidence of hemorrhage, infarct, mass lesions or hydrocephalus. Patient will likely need LP to r/o cryptococcal meningitis; will wait to attempt until hospitalist consulted given thrombocytopenia.  Dr. Lynelle Doctor consulted with unassigned hospitalist for admission; will admit for further work up and evaluation. Temp down to 98.1. Will continue to monitor. Patient work up and management discussed with Dr. Devoria Albe who is in agreement.  Antony Madura, PA-C 05/12/12 [address], [place] 06/15/12 [phone]

## 2012-05-13 LAB — URINE CULTURE

## 2012-05-13 LAB — T-HELPER CELLS (CD4) COUNT (NOT AT ARMC)
CD4 % Helper T Cell: 1 % — ABNORMAL LOW (ref 33–55)
CD4 T Cell Abs: <10 uL — ABNORMAL LOW (ref 400–2700)

## 2012-05-13 LAB — HIV-1 RNA QUANT-NO REFLEX-BLD: HIV-1 RNA Quant, Log: 4.8 {Log} — ABNORMAL HIGH (ref <1.30)

## 2012-05-15 ENCOUNTER — Inpatient Hospital Stay (HOSPITAL_COMMUNITY)
Admission: EM | Admit: 2012-05-15 | Discharge: 2012-05-17 | DRG: 977 | Disposition: A | Discharge status: Home / Self Care | Payer: MEDICAID | Attending: Internal Medicine | Admitting: Internal Medicine

## 2012-05-15 ENCOUNTER — Emergency Department (HOSPITAL_COMMUNITY): Payer: Self-pay

## 2012-05-15 ENCOUNTER — Encounter (HOSPITAL_COMMUNITY): Payer: Self-pay | Admitting: Emergency Medicine

## 2012-05-15 DIAGNOSIS — E8809 Other disorders of plasma-protein metabolism, not elsewhere classified: Secondary | ICD-10-CM | POA: Diagnosis present

## 2012-05-15 DIAGNOSIS — B20 Human immunodeficiency virus [HIV] disease: Principal | ICD-10-CM | POA: Diagnosis present

## 2012-05-15 DIAGNOSIS — B394 Histoplasmosis capsulati, unspecified: Secondary | ICD-10-CM

## 2012-05-15 DIAGNOSIS — R21 Rash and other nonspecific skin eruption: Secondary | ICD-10-CM | POA: Diagnosis present

## 2012-05-15 DIAGNOSIS — Z91199 Patient's noncompliance with other medical treatment and regimen due to unspecified reason: Secondary | ICD-10-CM

## 2012-05-15 DIAGNOSIS — D61818 Other pancytopenia: Secondary | ICD-10-CM | POA: Diagnosis present

## 2012-05-15 DIAGNOSIS — Z9119 Patient's noncompliance with other medical treatment and regimen: Secondary | ICD-10-CM

## 2012-05-15 DIAGNOSIS — B079 Viral wart, unspecified: Secondary | ICD-10-CM | POA: Diagnosis present

## 2012-05-15 DIAGNOSIS — A419 Sepsis, unspecified organism: Secondary | ICD-10-CM

## 2012-05-15 DIAGNOSIS — R6510 Systemic inflammatory response syndrome (SIRS) of non-infectious origin without acute organ dysfunction: Secondary | ICD-10-CM | POA: Diagnosis present

## 2012-05-15 DIAGNOSIS — E871 Hypo-osmolality and hyponatremia: Secondary | ICD-10-CM | POA: Diagnosis present

## 2012-05-15 DIAGNOSIS — R509 Fever, unspecified: Secondary | ICD-10-CM | POA: Diagnosis present

## 2012-05-15 LAB — CBC WITH DIFFERENTIAL/PLATELET
Eosinophils Absolute: 0 10*3/uL (ref 0.0–0.7)
Hemoglobin: 9.9 g/dL — ABNORMAL LOW (ref 13.0–17.0)
Lymphocytes Relative: 22 % (ref 12–46)
Lymphs Abs: 0.4 10*3/uL — ABNORMAL LOW (ref 0.7–4.0)
MCH: 25.5 pg — ABNORMAL LOW (ref 26.0–34.0)
Neutro Abs: 1.2 10*3/uL — ABNORMAL LOW (ref 1.7–7.7)
Neutrophils Relative %: 67 % (ref 43–77)
Platelets: 53 10*3/uL — ABNORMAL LOW (ref 150–400)
RBC: 3.88 MIL/uL — ABNORMAL LOW (ref 4.22–5.81)
WBC: 1.8 10*3/uL — ABNORMAL LOW (ref 4.0–10.5)

## 2012-05-15 LAB — RAPID URINE DRUG SCREEN, HOSP PERFORMED
Amphetamines: NOT DETECTED
Barbiturates: NOT DETECTED
Benzodiazepines: NOT DETECTED
Cocaine: NOT DETECTED
Opiates: NOT DETECTED
Tetrahydrocannabinol: NOT DETECTED

## 2012-05-15 LAB — URINALYSIS, ROUTINE W REFLEX MICROSCOPIC
Bilirubin Urine: NEGATIVE
Leukocytes, UA: NEGATIVE
Nitrite: NEGATIVE
Specific Gravity, Urine: 1.025 (ref 1.005–1.030)
Urobilinogen, UA: 1 mg/dL (ref 0.0–1.0)

## 2012-05-15 LAB — COMPREHENSIVE METABOLIC PANEL
ALT: 59 U/L — ABNORMAL HIGH (ref 0–53)
BUN: 11 mg/dL (ref 6–23)
Calcium: 7.5 mg/dL — ABNORMAL LOW (ref 8.4–10.5)
GFR calc Af Amer: >90 mL/min (ref >90)
Glucose, Bld: 109 mg/dL — ABNORMAL HIGH (ref 70–99)
Sodium: 133 mEq/L — ABNORMAL LOW (ref 135–145)
Total Protein: 6.7 g/dL (ref 6.0–8.3)

## 2012-05-15 LAB — PROCALCITONIN: Procalcitonin: 0.26 ng/mL

## 2012-05-15 LAB — T-HELPER CELLS (CD4) COUNT (NOT AT ARMC)
CD4 % Helper T Cell: 1 % — ABNORMAL LOW (ref 33–55)
CD4 T Cell Abs: <10 uL — ABNORMAL LOW (ref 400–2700)

## 2012-05-15 LAB — MRSA PCR SCREENING: MRSA by PCR: NEGATIVE

## 2012-05-15 MED ORDER — SULFAMETHOXAZOLE-TRIMETHOPRIM 400-80 MG PO TABS: 2.0000 | ORAL_TABLET | Freq: Every day | ORAL | Status: DC

## 2012-05-15 MED ORDER — ONDANSETRON HCL 4 MG PO TABS: 4.0000 mg | ORAL_TABLET | Freq: Four times a day (QID) | ORAL | Status: DC | PRN

## 2012-05-15 MED ORDER — SODIUM CHLORIDE 0.9 % IV SOLN
1000.0000 mL | Freq: Once | INTRAVENOUS | Status: AC
Start: 2012-05-15 — End: 2012-05-15
  Administered 2012-05-15: 1000 mL via INTRAVENOUS

## 2012-05-15 MED ORDER — VANCOMYCIN HCL IN DEXTROSE 1-5 GM/200ML-% IV SOLN
1000.0000 mg | Freq: Once | INTRAVENOUS | Status: AC
  Administered 2012-05-15: 1000 mg via INTRAVENOUS
  Filled 2012-05-15: qty 200

## 2012-05-15 MED ORDER — ONDANSETRON HCL 4 MG/2ML IJ SOLN: 4.0000 mg | Freq: Four times a day (QID) | INTRAMUSCULAR | Status: DC | PRN

## 2012-05-15 MED ORDER — AZITHROMYCIN 600 MG PO TABS
1200.0000 mg | ORAL_TABLET | ORAL | Status: DC
  Administered 2012-05-15: 1200 mg via ORAL
  Filled 2012-05-15: qty 2

## 2012-05-15 MED ORDER — SODIUM CHLORIDE 0.9 % IV BOLUS (SEPSIS)
2000.0000 mL | Freq: Once | INTRAVENOUS | Status: AC
  Administered 2012-05-15: 1000 mL via INTRAVENOUS

## 2012-05-15 MED ORDER — SODIUM CHLORIDE 0.9 % IV SOLN: 1000.0000 mL | Freq: Once | INTRAVENOUS | Status: DC

## 2012-05-15 MED ORDER — IBUPROFEN 200 MG PO TABS
600.0000 mg | ORAL_TABLET | Freq: Once | ORAL | Status: AC
  Administered 2012-05-15: 600 mg via ORAL
  Filled 2012-05-15: qty 3

## 2012-05-15 MED ORDER — SULFAMETHOXAZOLE-TRIMETHOPRIM 400-80 MG/5ML IV SOLN
15.0000 mg/kg/d | Freq: Four times a day (QID) | INTRAVENOUS | Status: DC
  Administered 2012-05-15 – 2012-05-16 (×4): 374.4 mg via INTRAVENOUS
  Filled 2012-05-15 (×7): qty 23.4

## 2012-05-15 MED ORDER — SODIUM CHLORIDE 0.9 % IV SOLN
1000.0000 mL | INTRAVENOUS | Status: DC
  Administered 2012-05-15 – 2012-05-16 (×2): 1000 mL via INTRAVENOUS

## 2012-05-15 MED ORDER — ALUM & MAG HYDROXIDE-SIMETH 200-200-20 MG/5ML PO SUSP: 30.0000 mL | Freq: Four times a day (QID) | ORAL | Status: DC | PRN

## 2012-05-15 MED ORDER — SODIUM CHLORIDE 0.9 % IV SOLN
INTRAVENOUS | Status: DC
  Administered 2012-05-15: 11:00:00 via INTRAVENOUS

## 2012-05-15 MED ORDER — PIPERACILLIN-TAZOBACTAM 4.5 G IVPB
4.5000 g | Freq: Once | INTRAVENOUS | Status: AC
  Administered 2012-05-15: 4.5 g via INTRAVENOUS
  Filled 2012-05-15: qty 100

## 2012-05-15 NOTE — ED Notes (Signed)
Pt only complaint is chills for 8 days. Was seen 3 days ago and d/c home. Pt denies any resp, GU, GI sx. Denies headache.

## 2012-05-15 NOTE — H&P (Signed)
Hospital Admission Note Date: 05/15/2012  Patient name: David Knapp Medical record number: [id number] Date of birth: [birth year] Age: 32 y.o. Gender: male PCP: No PCP Per Patient  Medical Service: Internal Medicine  Attending physician: Dr. Criselda Peaches    1st Contact: Sherrine Maples     Pager:[phone] 2nd Contact: Sharda    Pager: [phone] After 5 pm or weekends: 1st Contact:      Pager: [phone] 2nd Contact:      Pager: [phone]  Chief Complaint: fever  History of Present Illness: 32 year old gentleman with past medical history significant for AIDS with CD4 count less than 10, medication noncompliance presents to the ED with fevers and chills for 10 days.  He was previously followed in the [facility] until about 2 years ago when he was lost to follow-up. He subsequently stopped taking his antiretroviral therapy. He returns to the ED having fevers and chills for 7- 10 days. He reports having some weight loss of around 20 pounds for last 4 months He also reports noticing some hyperpigmented rash on his skin for last 4 months. Denies any itching or pain associated with it. He was also noted to have warts on both his hands that he states has been present for more than a year. He denies any systemic complaints including chest pain, cough, shortness of breath, abdominal pain, nausea, alteration in bladder or bowel habits.  Of note , he was admitted to the teaching service for one day through 05/12/2012 to 05/13/2012 and was discharged home with a followup in [facility] but he came back to the ED before he could be seen in the clinic.     Meds: No current outpatient prescriptions on file.  Allergies: Allergies as of 05/15/2012  . (No Known Allergies)   Past Medical History  Diagnosis Date  . HIV (human immunodeficiency virus infection)    History reviewed. No pertinent past surgical history. No family history on file. History   Social History  . Marital Status: Single    Spouse Name: N/A    Number of  Children: N/A  . Years of Education: N/A   Occupational History  . Not on file.   Social History Main Topics  . Smoking status: Never Smoker   . Smokeless tobacco: Never Used  . Alcohol Use: 3.0 oz/week    6 drink(s) per week  . Drug Use: No  . Sexually Active: No   Other Topics Concern  . Not on file   Social History Narrative  . No narrative on file    Review of Systems: Constitutional: negative for chills, fatigue, malaise and night sweats. Positive for fever, weight loss( ~20 lbs in 4 months) HEENT: negative for earaches, epistaxis, hoarseness, nasal congestion, sore throat and voice change Respiratory: negative for cough, dyspnea on exertion, sputum, stridor and wheezing Cardiovascular: negative for chest pain, irregular heart beat, lower extremity edema, orthopnea, palpitations and paroxysmal nocturnal dyspnea Genitourinary:negative for dysuria, frequency, hematuria and nocturia Musculoskeletal:negative for arthralgias, myalgias and stiff joints Neurological: negative for dizziness, headaches, memory problems, paresthesia, seizures, speech problems and weakness Skin: Positive for rash  Physical Exam: Blood pressure 92/48, pulse 90, temperature 98.3 F (36.8 C), temperature source Oral, resp. rate 18, height 5' 6.14" (1.68 m), weight 220 lb 0.3 oz (99.8 kg), SpO2 99.00%. BP 92/48  Pulse 90  Temp(Src) 98.3 F (36.8 C) (Oral)  Resp 18  Ht 5' 6.14" (1.68 m)  Wt 220 lb 0.3 oz (99.8 kg)  BMI 35.36 kg/m2  SpO2 99%  General  Appearance:    Alert, cooperative, no distress, appears stated age  Head:    Normocephalic, without obvious abnormality, atraumatic  Eyes:    PERRL, conjunctiva/corneas clear, EOM's intact, fundi    benign, both eyes       Ears:    Normal TM's and external ear canals, both ears  Nose:   Nares normal, septum midline, mucosa normal, no drainage    or sinus tenderness  Throat:   Lips, mucosa, and tongue normal; teeth and gums normal  Neck:   Supple,  symmetrical, trachea midline, no adenopathy;       thyroid:  No enlargement/tenderness/nodules; no carotid   bruit or JVD  Back:     Symmetric, no curvature, ROM normal, no CVA tenderness  Lungs:     Clear to auscultation bilaterally, respirations unlabored  Chest wall:    No tenderness or deformity  Heart:    Regular rate and rhythm, S1 and S2 normal, no murmur, rub   or gallop  Abdomen:     Soft, non-tender, bowel sounds active all four quadrants,    no masses, no organomegaly  Genitalia:    Normal male without lesion, discharge or tenderness  Rectal:    Normal tone, normal prostate, no masses or tenderness;   guaiac negative stool  Extremities:   Extremities normal, atraumatic, no cyanosis or edema  Pulses:   2+ and symmetric all extremities  Skin:   Hyperpigmented patches with dry scaly skin were noted on both the lower extremities, a patch on the left calf measures about 10 x10 cm, right thigh is relatively smaller  warts present on both the hands  Lymph nodes:   Cervical, supraclavicular, and axillary nodes normal  Neurologic:   CNII-XII intact. Normal strength, sensation and reflexes      throughout    Lab results: Basic Metabolic Panel:  Recent Labs  98/11/91 0620  NA 133*  K 3.5  CL 101  CO2 24  GLUCOSE 109*  BUN 11  CREATININE 0.72  CALCIUM 7.5*   Liver Function Tests:  Recent Labs  05/15/12 0620  AST 85*  ALT 59*  ALKPHOS 116  BILITOT 0.5  PROT 6.7  ALBUMIN 2.0*   CBC:  Recent Labs  05/15/12 0508  WBC 1.8*  NEUTROABS 1.2*  HGB 9.9*  HCT 29.9*  MCV 77.1*  PLT 53*     Urinalysis:  Recent Labs  05/12/12 0848 05/15/12 0640  COLORURINE YELLOW AMBER*  LABSPEC 1.021 1.025  PHURINE 6.5 7.5  GLUCOSEU NEGATIVE NEGATIVE  HGBUR NEGATIVE NEGATIVE  BILIRUBINUR NEGATIVE NEGATIVE  KETONESUR NEGATIVE NEGATIVE  PROTEINUR NEGATIVE NEGATIVE  UROBILINOGEN 1.0 1.0  NITRITE NEGATIVE NEGATIVE  LEUKOCYTESUR NEGATIVE NEGATIVE    Imaging results:  Dg  Chest 2 View  05/15/2012   *RADIOLOGY REPORT*  Clinical Data: Fever, chills.  CHEST - 2 VIEW  Comparison: 05/12/2012  Findings: Lungs are predominately clear. No pleural effusion or pneumothorax. The cardiomediastinal contours are within normal limits. The visualized bones and soft tissues are without significant appreciable abnormality.  IMPRESSION: No radiographic evidence of acute cardiopulmonary process.   Original Report Authenticated By: Jearld Lesch, M.D.    Other results: No EKG obtained  Assessment & Plan by Problem: Active Problems:   HIV DISEASE   WARTS, BOTH HANDS   Pancytopenia   Fever  #SIRS: Patient presents with fevers and chills ( Tmax - 103)and was tachycardic( HR - 120's) on his arrival to ED with WBC 1.8. His BP was soft on  arrival but has responded to the fluids( even at baseline - patient seems to have soft BP with systolic's in 100's) . His lactate is 0.9. His UA is clear and chest x-ray does not show any evidence of pneumonia. His LDH is elevated but he is not hypoxic or short of breath that makes PCP less likely but still a possibility with CD-4 count <10. Of note patient has history of disseminated histoplasmosis in 2008 , which could be a possibility this time again .He was treated with Vanc, Zosyn and Bactrim in the ED But given his CD4 count of less than 10, would start him on prophylaxis antibiotics for PCP and MAC. - Admit to step down unit - Followup blood cultures, sputum and urine cultures - Obtain sputum PCP , influenza panel - Azithromycin 1200 mg, weekly for MAC prophylaxis - Start Bactrim at PCP prophylaxis dose for now( 160  -800 mg daily). Await ID recs if that needs to be on treatment dose.    # AIDS: His CD4 count is less than 10 and VL~ 62,000 ( 05/12/12). His disease was very well controlled in 2012  With the VL<  20 and CD-4 count of 310. His disease has progressed over the period of time in the setting of medication noncompliance. - Check  genotype - Call the ID consult  # Transaminitis : Check hepatitis panel.   # # Pancytopenia: Differentials include viral infections like CMVvs tick borne illness like Ehrlichia vs malignancy. - Check CMV - Check Ehrlichia, RMSF titres  # Skin rash; He is noted to have hyperpigmented skin rash with some dry scaly skin in the lower extremities. Differentials include lichen planus vs fungal ( crypto vs histoplasmosis) - Check histoplasma  # DVT: SCD's  Dispo: Disposition is deferred at this time, awaiting improvement of current medical problems. Anticipated discharge in approximately 2- 3 day(s).   The patient does have a current PCP (No PCP Per Patient), therefore will be requiring OPC follow-up after discharge.   The patient does not have transportation limitations that hinder transportation to clinic appointments.  Signed: Dereka Lueras 05/15/2012, 8:27 AM

## 2012-05-15 NOTE — ED Notes (Signed)
Family at bedside. 

## 2012-05-15 NOTE — ED Notes (Signed)
E-link paged to upgrade Code sepsis to level 1

## 2012-05-15 NOTE — Consult Note (Signed)
PULMONARY  / CRITICAL CARE MEDICINE  Name: David Knapp MRN: [id number] DOB: Apr 02, 1980    ADMISSION DATE:  05/15/2012 CONSULTATION DATE:  5/14  REFERRING MD :  Teaching service  PRIMARY SERVICE:  Teaching service  CHIEF COMPLAINT:  sepsis  BRIEF PATIENT DESCRIPTION: 32yo male with hx HIV presented 5/14 with 8 day hx fever.  Mildly hypotensive in ER and code sepsis called, PCCM consulted.   SIGNIFICANT EVENTS / STUDIES:    LINES / TUBES: none  CULTURES: BCx2 5/14>>> Urine 5/14>>>  ANTIBIOTICS: Vanc 5/14>>> Zosyn 5/14>>> Bactrim 5/14>>>  HISTORY OF PRESENT ILLNESS:  32yo male with known AIDS and pancytopenia presented 5/14 with 8 day hx fevers, chills, weight loss.  Recent admit for same (d/c 5/11) but source of fever never determined and cultures remained neg from that admit.  Only other c/o is new rash to L leg which has been present approx 5-6 months.  Denies taking any HIV meds regularly.  Denies chest pain, SOB, cough, hemoptysis.    PAST MEDICAL HISTORY :  Past Medical History  Diagnosis Date  . HIV (human immunodeficiency virus infection)    History reviewed. No pertinent past surgical history. Prior to Admission medications   Not on File   No Known Allergies  FAMILY HISTORY:  No family history on file. SOCIAL HISTORY:  reports that he has never smoked. He has never used smokeless tobacco. He reports that he drinks about 3.0 ounces of alcohol per week. He reports that he does not use illicit drugs.  REVIEW OF SYSTEMS:   As per HPI.  All other systems reviewed and were neg.   VITAL SIGNS: Temp:  [98.1 F (36.7 C)-103.3 F (39.6 C)] 98.3 F (36.8 C) (05/14 0725) Pulse Rate:  [79-124] 90 (05/14 0745) Resp:  [18] 18 (05/14 0436) BP: (85-101)/(38-59) 92/48 mmHg (05/14 0745) SpO2:  [97 %-99 %] 99 % (05/14 0745) Weight:  [220 lb 0.3 oz (99.8 kg)] 220 lb 0.3 oz (99.8 kg) (05/14 0725)  PHYSICAL EXAMINATION: General:  wdwn male NAD Neuro:  Awake, alert,  appropriate, MAE HEENT:  Mm dry, poor dentition  Cardiovascular:  s1s2 rrr, mild tachy Lungs:  resps even non labored, posterior crackles  Abdomen:  Abd soft, +bs Ext: warm and dry, L lateral lower ext with dry, thickened, excema-like patches, no edema    Recent Labs Lab 05/12/12 0748 05/15/12 0620  NA 131* 133*  K 3.6 3.5  CL 101 101  CO2 24 24  BUN 12 11  CREATININE 0.74 0.72  GLUCOSE 107* 109*    Recent Labs Lab 05/12/12 0748 05/15/12 0508  HGB 9.8* 9.9*  HCT 28.8* 29.9*  WBC 1.8* 1.8*  PLT 55* 53*   Dg Chest 2 View  05/15/2012   *RADIOLOGY REPORT*  Clinical Data: Fever, chills.  CHEST - 2 VIEW  Comparison: 05/12/2012  Findings: Lungs are predominately clear. No pleural effusion or pneumothorax. The cardiomediastinal contours are within normal limits. The visualized bones and soft tissues are without significant appreciable abnormality.  IMPRESSION: No radiographic evidence of acute cardiopulmonary process.   Original Report Authenticated By: Jearld Lesch, M.D.    ASSESSMENT / PLAN:  Fever  HIV/AIDS  Pancytopenia  Hyponatremia - mild  Hypotension - mild  SIRS   PLAN -  - agree admit SDU per teaching service  - cont gentle volume  - abx, including bactrim to cover for PCP although CXR clear. May be more appropriate to change to prophylaxis dosing, could discuss w ID -  f/u culture  - ID input regarding fevers and HIV regimen  - check pct   PCCM will be available PRN.   Danford Bad, NP 05/15/2012  8:46 AM Pager: [phone] or [phone]  *Care during the described time interval was provided by me and/or other providers on the critical care team. I have reviewed this patient's available data, including medical history, events of note, physical examination and test results as part of my evaluation.  Levy Pupa, MD, PhD 05/15/2012, 9:16 AM [facility] [phone] or if no answer [phone]

## 2012-05-15 NOTE — Progress Notes (Signed)
ANTIBIOTIC CONSULT NOTE - INITIAL  Pharmacy Consult: Septra Indication:   Suspected PCP PNA  No Known Allergies  Patient Measurements: Height: 5' 6.14" (168 cm) Weight: 220 lb 0.3 oz (99.8 kg) IBW/kg (Calculated) : 64.13  Vital Signs: Temp: 98.3 F (36.8 C) (05/14 0725) Temp src: Oral (05/14 0725) BP: 85/38 mmHg (05/14 0725) Pulse Rate: 79 (05/14 0725)  Labs:  Recent Labs  05/12/12 0748 05/15/12 0508 05/15/12 0620  WBC 1.8* 1.8*  --   HGB 9.8* 9.9*  --   PLT 55* 53*  --   CREATININE 0.74  --  0.72   The CrCl is unknown because both a height and weight (above a minimum accepted value) are required for this calculation. No results found for this basename: VANCOTROUGH, VANCOPEAK, VANCORANDOM, GENTTROUGH, GENTPEAK, GENTRANDOM, TOBRATROUGH, TOBRAPEAK, TOBRARND, AMIKACINPEAK, AMIKACINTROU, AMIKACIN,  in the last 72 hours   Microbiology: Recent Results (from the past 720 hour(s))  URINE CULTURE     Status: None   Collection Time    05/12/12  8:48 AM      Result Value Range Status   Specimen Description URINE, RANDOM   Final   Special Requests NONE   Final   Culture  Setup Time 05/12/2012 19:58   Final   Colony Count NO GROWTH   Final   Culture NO GROWTH   Final   Report Status 05/13/2012 FINAL   Final  CULTURE, BLOOD (ROUTINE X 2)     Status: None   Collection Time    05/12/12  9:23 AM      Result Value Range Status   Specimen Description BLOOD RIGHT HAND   Final   Special Requests BOTTLES DRAWN AEROBIC ONLY 8CC   Final   Culture  Setup Time 05/12/2012 18:46   Final   Culture     Final   Value:        BLOOD CULTURE RECEIVED NO GROWTH TO DATE CULTURE WILL BE HELD FOR 5 DAYS BEFORE ISSUING A FINAL NEGATIVE REPORT   Report Status PENDING   Incomplete  CULTURE, BLOOD (ROUTINE X 2)     Status: None   Collection Time    05/12/12  9:25 AM      Result Value Range Status   Specimen Description BLOOD RIGHT ARM   Final   Special Requests BOTTLES DRAWN AEROBIC ONLY 10CC    Final   Culture  Setup Time 05/12/2012 18:46   Final   Culture     Final   Value:        BLOOD CULTURE RECEIVED NO GROWTH TO DATE CULTURE WILL BE HELD FOR 5 DAYS BEFORE ISSUING A FINAL NEGATIVE REPORT   Report Status PENDING   Incomplete  AFB CULTURE, BLOOD     Status: None   Collection Time    05/12/12  2:03 PM      Result Value Range Status   Specimen Description BLOOD RIGHT ARM   Final   Special Requests BOTTLES DRAWN AEROBIC ONLY 5CC   Final   Culture     Final   Value: CULTURE WILL BE EXAMINED FOR 6 WEEKS BEFORE ISSUING A FINAL REPORT   Report Status PENDING   Incomplete    Medical History: Past Medical History  Diagnosis Date  . HIV (human immunodeficiency virus infection)        Assessment: 56 YOM with PMH significant for HIV admitted with chief complaint of fever with chills.  Pharmacy consulted to manage IV Septra for suspected PCP PNA.  Patient with excellent renal clearance.   Goal of Therapy:  Clearance of infection / infection prevention   Plan:  - Septra 374mg  IV Q6H (~15 mg/kg/d of TMP) - Monitor renal fxn, clinical course     Shoshanna Mcquitty D. Laney Potash, PharmD, BCPS Pager:  [phone] 05/15/2012, 7:42 AM

## 2012-05-15 NOTE — Consult Note (Signed)
INFECTIOUS DISEASE CONSULT NOTE  Date of Admission:  05/15/2012  Date of Consult:  05/15/2012  Reason for Consult: AIDS Referring Physician: Mullen\Ziemer  Impression/Recommendation AIDS Fever  Would- check BCx , BCx AFB Check RPR, urine gc/chlamydia Consider bone marrow Bx check CD4 and HIV RNA/genotype Check serum Crypto Ag.  Check Quantiferon gold Start empiric MAI therapy (ethambutol 15mg /kg/day, azithro 500mg /day, rifabutin 300mg /day) after he has BCx for AFB (and Bone marrow bx)   DDX- MAI, lymphoma, and occult crypto. Disseminated TB is certainly possible given that he is not from [place]. Would consider restarting his ART (qday regimen such as stribilid or atripla or even truvada/tivicay) after he is on MAI tx. I am not sure he needs bactrim, has clear CXR, has not been hypoxic.   Thank you so much for this interesting consult,   Johny Sax [phone]  David Knapp is an 32 y.o. male.  HPI: 32 yo M born in [place] (in [place] last 10 yrs) with hx of AIDS (dx 2009 per pt) was prev followed in [facility] but moved to [place] ~ 98yr ago and went off his ART (previously ATVr/TRV, his only ART).  He was admitted to the hospital on 5-11 to 5-12 with fever, headache and cough. He improved in hospital (CXR-) and was d/c home to f/u in ID clinic. He returned 5-14 with fever and cough. He was found in ED to have ANC 670 and temp to 103. His CXR is still (-).   Past Medical History  Diagnosis Date  . HIV (human immunodeficiency virus infection)     History reviewed. No pertinent past surgical history.   No Known Allergies  Medications:  Scheduled: . sodium chloride  1,000 mL Intravenous Once  . azithromycin  1,200 mg Oral Weekly  . sulfamethoxazole-trimethoprim  15 mg/kg/day Intravenous Q6H    Total days of antibiotics : 1 (bactrim)          Social History:  reports that he has never smoked. He has never used smokeless tobacco. He reports that he drinks about 3.0 ounces of  alcohol per week. He reports that he does not use illicit drugs.  History reviewed. No pertinent family history.  General ROS: denies vision change, no problems with BM, no problems with urination. denies headaches. no problems with dysphagia. see HPI.   Blood pressure 100/61, pulse 87, temperature 98.2 F (36.8 C), temperature source Oral, resp. rate 29, height 5' 6.14" (1.68 m), weight 99.8 kg (220 lb 0.3 oz), SpO2 98.00%. General appearance: alert and no distress Eyes: negative findings: pupils equal, round, reactive to light and accomodation and no photophobia Throat: lips, mucosa, and tongue normal; teeth and gums normal Neck: no adenopathy, supple, symmetrical, trachea midline and no meningismus Lungs: clear to auscultation bilaterally Heart: regular rate and rhythm Abdomen: normal findings: bowel sounds normal and soft, non-tender Extremities: edema none   Results for orders placed during the hospital encounter of 05/15/12 (from the past 48 hour(s))  LACTIC ACID, PLASMA     Status: None   Collection Time    05/15/12  5:04 AM      Result Value Range   Lactic Acid, Venous 0.9  0.5 - 2.2 mmol/L  CBC WITH DIFFERENTIAL     Status: Abnormal   Collection Time    05/15/12  5:08 AM      Result Value Range   WBC 1.8 (*) 4.0 - 10.5 K/uL   RBC 3.88 (*) 4.22 - 5.81 MIL/uL   Hemoglobin 9.9 (*)  13.0 - 17.0 g/dL   HCT 16.1 (*) 09.6 - 04.5 %   MCV 77.1 (*) 78.0 - 100.0 fL   MCH 25.5 (*) 26.0 - 34.0 pg   MCHC 33.1  30.0 - 36.0 g/dL   RDW 40.9  81.1 - 91.4 %   Platelets 53 (*) 150 - 400 K/uL   Comment: CONSISTENT WITH PREVIOUS RESULT   Neutrophils Relative % 67  43 - 77 %   Neutro Abs 1.2 (*) 1.7 - 7.7 K/uL   Lymphocytes Relative 22  12 - 46 %   Lymphs Abs 0.4 (*) 0.7 - 4.0 K/uL   Monocytes Relative 11  3 - 12 %   Monocytes Absolute 0.2  0.1 - 1.0 K/uL   Eosinophils Relative 1  0 - 5 %   Eosinophils Absolute 0.0  0.0 - 0.7 K/uL   Basophils Relative 0  0 - 1 %   Basophils Absolute  0.0  0.0 - 0.1 K/uL  T-HELPER CELLS (CD4) COUNT     Status: Abnormal   Collection Time    05/15/12  5:08 AM      Result Value Range   CD4 T Cell Abs <10 (*) 400 - 2700 cmm   CD4 % Helper T Cell 1 (*) 33 - 55 %  COMPREHENSIVE METABOLIC PANEL     Status: Abnormal   Collection Time    05/15/12  6:20 AM      Result Value Range   Sodium 133 (*) 135 - 145 mEq/L   Potassium 3.5  3.5 - 5.1 mEq/L   Chloride 101  96 - 112 mEq/L   CO2 24  19 - 32 mEq/L   Glucose, Bld 109 (*) 70 - 99 mg/dL   BUN 11  6 - 23 mg/dL   Creatinine, Ser 7.82  0.50 - 1.35 mg/dL   Calcium 7.5 (*) 8.4 - 10.5 mg/dL   Total Protein 6.7  6.0 - 8.3 g/dL   Albumin 2.0 (*) 3.5 - 5.2 g/dL   AST 85 (*) 0 - 37 U/L   ALT 59 (*) 0 - 53 U/L   Alkaline Phosphatase 116  39 - 117 U/L   Total Bilirubin 0.5  0.3 - 1.2 mg/dL   GFR calc non Af Amer >90  >90 mL/min   GFR calc Af Amer >90  >90 mL/min   Comment:            The eGFR has been calculated     using the CKD EPI equation.     This calculation has not been     validated in all clinical     situations.     eGFR's persistently     <90 mL/min signify     possible Chronic Kidney Disease.  PROCALCITONIN     Status: None   Collection Time    05/15/12  6:20 AM      Result Value Range   Procalcitonin 0.26     Comment:            Interpretation:     PCT (Procalcitonin) <= 0.5 ng/mL:     Systemic infection (sepsis) is not likely.     Local bacterial infection is possible.     (NOTE)             ICU PCT Algorithm               Non ICU PCT Algorithm        ----------------------------     ------------------------------  PCT < 0.25 ng/mL                 PCT < 0.1 ng/mL         Stopping of antibiotics            Stopping of antibiotics           strongly encouraged.               strongly encouraged.        ----------------------------     ------------------------------           PCT level decrease by               PCT < 0.25 ng/mL           >= 80% from peak PCT            OR PCT 0.25 - 0.5 ng/mL          Stopping of antibiotics                                                 encouraged.         Stopping of antibiotics               encouraged.        ----------------------------     ------------------------------           PCT level decrease by              PCT >= 0.25 ng/mL           < 80% from peak PCT            AND PCT >= 0.5 ng/mL            Continuing antibiotics                                                  encouraged.           Continuing antibiotics                encouraged.        ----------------------------     ------------------------------         PCT level increase compared          PCT > 0.5 ng/mL             with peak PCT AND              PCT >= 0.5 ng/mL             Escalation of antibiotics                                              strongly encouraged.          Escalation of antibiotics            strongly encouraged.  LACTATE DEHYDROGENASE     Status: Abnormal   Collection Time    05/15/12  6:31 AM      Result Value Range   LDH 398 (*) 94 -  250 U/L  URINALYSIS, ROUTINE W REFLEX MICROSCOPIC     Status: Abnormal   Collection Time    05/15/12  6:40 AM      Result Value Range   Color, Urine AMBER (*) YELLOW   Comment: BIOCHEMICALS MAY BE AFFECTED BY COLOR   APPearance CLEAR  CLEAR   Specific Gravity, Urine 1.025  1.005 - 1.030   pH 7.5  5.0 - 8.0   Glucose, UA NEGATIVE  NEGATIVE mg/dL   Hgb urine dipstick NEGATIVE  NEGATIVE   Bilirubin Urine NEGATIVE  NEGATIVE   Ketones, ur NEGATIVE  NEGATIVE mg/dL   Protein, ur NEGATIVE  NEGATIVE mg/dL   Urobilinogen, UA 1.0  0.0 - 1.0 mg/dL   Nitrite NEGATIVE  NEGATIVE   Leukocytes, UA NEGATIVE  NEGATIVE   Comment: MICROSCOPIC NOT DONE ON URINES WITH NEGATIVE PROTEIN, BLOOD, LEUKOCYTES, NITRITE, OR GLUCOSE <1000 mg/dL.  MRSA PCR SCREENING     Status: None   Collection Time    05/15/12 10:40 AM      Result Value Range   MRSA by PCR NEGATIVE  NEGATIVE   Comment:             The GeneXpert MRSA Assay (FDA     approved for NASAL specimens     only), is one component of a     comprehensive MRSA colonization     surveillance program. It is not     intended to diagnose MRSA     infection nor to guide or     monitor treatment for     MRSA infections.  URINE RAPID DRUG SCREEN (HOSP PERFORMED)     Status: None   Collection Time    05/15/12 10:54 AM      Result Value Range   Opiates NONE DETECTED  NONE DETECTED   Cocaine NONE DETECTED  NONE DETECTED   Benzodiazepines NONE DETECTED  NONE DETECTED   Amphetamines NONE DETECTED  NONE DETECTED   Tetrahydrocannabinol NONE DETECTED  NONE DETECTED   Barbiturates NONE DETECTED  NONE DETECTED   Comment:            DRUG SCREEN FOR MEDICAL PURPOSES     ONLY.  IF CONFIRMATION IS NEEDED     FOR ANY PURPOSE, NOTIFY LAB     WITHIN 5 DAYS.                LOWEST DETECTABLE LIMITS     FOR URINE DRUG SCREEN     Drug Class       Cutoff (ng/mL)     Amphetamine      1000     Barbiturate      200     Benzodiazepine   200     Tricyclics       300     Opiates          300     Cocaine          300     THC              50      Component Value Date/Time   SDES BLOOD RIGHT ARM 05/12/2012 1403   SPECREQUEST BOTTLES DRAWN AEROBIC ONLY 5CC 05/12/2012 1403   CULT CULTURE WILL BE EXAMINED FOR 6 WEEKS BEFORE ISSUING A FINAL REPORT 05/12/2012 1403   REPTSTATUS PENDING 05/12/2012 1403   Dg Chest 2 View  05/15/2012   *RADIOLOGY REPORT*  Clinical Data: Fever, chills.  CHEST - 2 VIEW  Comparison: 05/12/2012  Findings: Lungs are predominately clear. No pleural effusion or pneumothorax. The cardiomediastinal contours are within normal limits. The visualized bones and soft tissues are without significant appreciable abnormality.  IMPRESSION: No radiographic evidence of acute cardiopulmonary process.   Original Report Authenticated By: Jearld Lesch, M.D.   Recent Results (from the past 240 hour(s))  URINE CULTURE     Status: None   Collection  Time    05/12/12  8:48 AM      Result Value Range Status   Specimen Description URINE, RANDOM   Final   Special Requests NONE   Final   Culture  Setup Time 05/12/2012 19:58   Final   Colony Count NO GROWTH   Final   Culture NO GROWTH   Final   Report Status 05/13/2012 FINAL   Final  CULTURE, BLOOD (ROUTINE X 2)     Status: None   Collection Time    05/12/12  9:23 AM      Result Value Range Status   Specimen Description BLOOD RIGHT HAND   Final   Special Requests BOTTLES DRAWN AEROBIC ONLY 8CC   Final   Culture  Setup Time 05/12/2012 18:46   Final   Culture     Final   Value:        BLOOD CULTURE RECEIVED NO GROWTH TO DATE CULTURE WILL BE HELD FOR 5 DAYS BEFORE ISSUING A FINAL NEGATIVE REPORT   Report Status PENDING   Incomplete  CULTURE, BLOOD (ROUTINE X 2)     Status: None   Collection Time    05/12/12  9:25 AM      Result Value Range Status   Specimen Description BLOOD RIGHT ARM   Final   Special Requests BOTTLES DRAWN AEROBIC ONLY 10CC   Final   Culture  Setup Time 05/12/2012 18:46   Final   Culture     Final   Value:        BLOOD CULTURE RECEIVED NO GROWTH TO DATE CULTURE WILL BE HELD FOR 5 DAYS BEFORE ISSUING A FINAL NEGATIVE REPORT   Report Status PENDING   Incomplete  AFB CULTURE, BLOOD     Status: None   Collection Time    05/12/12  2:03 PM      Result Value Range Status   Specimen Description BLOOD RIGHT ARM   Final   Special Requests BOTTLES DRAWN AEROBIC ONLY 5CC   Final   Culture     Final   Value: CULTURE WILL BE EXAMINED FOR 6 WEEKS BEFORE ISSUING A FINAL REPORT   Report Status PENDING   Incomplete  MRSA PCR SCREENING     Status: None   Collection Time    05/15/12 10:40 AM      Result Value Range Status   MRSA by PCR NEGATIVE  NEGATIVE Final   Comment:            The GeneXpert MRSA Assay (FDA     approved for NASAL specimens     only), is one component of a     comprehensive MRSA colonization     surveillance program. It is not     intended to diagnose  MRSA     infection nor to guide or     monitor treatment for     MRSA infections.      05/15/2012, 3:57 PM     LOS: 0 days

## 2012-05-15 NOTE — ED Notes (Signed)
Care transferred, report received Eme, RN

## 2012-05-15 NOTE — Progress Notes (Addendum)
Responded to upgrade code sepsis level 1.  On arrival patient supine in bed alert no distress.  ED RN Mal[place] at [facility].   Skin warm and moist.  Neuro intact.  Bil BS equal and clear.  RR and unlabored rate 12.  BP 88/45 map 58. Initial lactate 0.9 at 0500. Has received 2 liters NS.   Denies pain or  SOB.  Vancomycin infusing.  IM resident at bedside speaking with Dr. Read Drivers ED Physician.  IM resident also spoke with Dr. Tracie Harrier, CCM. NS Liter bolus restarted per order IM resident.   IM To evaluate patient for admit.  I spoke with PP re: be alert for upcoming bed request.  ED RN [facility] aware of plan.  To call as needed.

## 2012-05-15 NOTE — ED Provider Notes (Signed)
History     CSN: [id number]  Arrival date & time 05/15/12  7846   First MD Initiated Contact with Patient 05/15/12 0501      Chief Complaint  Patient presents with  . Fever and Chills     (Consider location/radiation/quality/duration/timing/severity/associated sxs/prior treatment) HPI This is a 32 year old male with known AIDS and pancytopenia who was recently admitted to the hospital for fever. The source of fever was never definitively determined and cultures remained negative. He is here with fever and chills that began about an hour prior to arrival. He denies any pain, shortness of breath, nausea, vomiting, diarrhea, lightheadedness or weakness. He is noted to be febrile and tachycardic with a low blood pressure on arrival. He is not on any anti-HIV medications. His symptoms are moderate although his vital signs suggest a more severe degree of illness. His CD4 count is known to be less than 10.  Past Medical History  Diagnosis Date  . HIV (human immunodeficiency virus infection)     History reviewed. No pertinent past surgical history.  No family history on file.  History  Substance Use Topics  . Smoking status: Never Smoker   . Smokeless tobacco: Never Used  . Alcohol Use: 3.0 oz/week    6 drink(s) per week      Review of Systems  All other systems reviewed and are negative.    Allergies  Review of patient's allergies indicates no known allergies.  Home Medications  No current outpatient prescriptions on file.  BP 85/38  Pulse 79  Temp(Src) 98.3 F (36.8 C) (Oral)  Resp 18  SpO2 98%  Physical Exam General: Well-developed, well-nourished male in no acute distress; appearance consistent with age of record HENT: normocephalic, atraumatic Eyes: pupils equal round and reactive to light; extraocular muscles intact Neck: supple Heart: regular rate and rhythm; tachycardic Lungs: clear to auscultation bilaterally Abdomen: soft; nondistended; nontender; no  masses or hepatosplenomegaly; bowel sounds present Extremities: No deformity; full range of motion; pulses normal Neurologic: Awake, alert and oriented; motor function intact in all extremities and symmetric; no facial droop Skin: Warm and dry; multiple condylomata of hands and fingers Psychiatric: Normal mood and affect    ED Course  Procedures (including critical care time)  CRITICAL CARE Performed by: Fotios Amos L Total critical care time: 30 minutes Critical care time was exclusive of separately billable procedures and treating other patients. Critical care was necessary to treat or prevent imminent or life-threatening deterioration. Critical care was time spent personally by me on the following activities: development of treatment plan with patient and/or surrogate as well as nursing, discussions with consultants, evaluation of patient's response to treatment, examination of patient, obtaining history from patient or surrogate, ordering and performing treatments and interventions, ordering and review of laboratory studies, ordering and review of radiographic studies, pulse oximetry and re-evaluation of patient's condition.   MDM  Nursing notes and vitals signs, including pulse oximetry, reviewed.  Summary of this visit's results, reviewed by myself:  Labs:  Results for orders placed during the hospital encounter of 05/15/12 (from the past 24 hour(s))  LACTIC ACID, PLASMA     Status: None   Collection Time    05/15/12  5:04 AM      Result Value Range   Lactic Acid, Venous 0.9  0.5 - 2.2 mmol/L  CBC WITH DIFFERENTIAL     Status: Abnormal   Collection Time    05/15/12  5:08 AM      Result Value Range  WBC 1.8 (*) 4.0 - 10.5 K/uL   RBC 3.88 (*) 4.22 - 5.81 MIL/uL   Hemoglobin 9.9 (*) 13.0 - 17.0 g/dL   HCT 16.1 (*) 09.6 - 04.5 %   MCV 77.1 (*) 78.0 - 100.0 fL   MCH 25.5 (*) 26.0 - 34.0 pg   MCHC 33.1  30.0 - 36.0 g/dL   RDW 40.9  81.1 - 91.4 %   Platelets 53 (*) 150 -  400 K/uL   Neutrophils Relative % 67  43 - 77 %   Neutro Abs 1.2 (*) 1.7 - 7.7 K/uL   Lymphocytes Relative 22  12 - 46 %   Lymphs Abs 0.4 (*) 0.7 - 4.0 K/uL   Monocytes Relative 11  3 - 12 %   Monocytes Absolute 0.2  0.1 - 1.0 K/uL   Eosinophils Relative 1  0 - 5 %   Eosinophils Absolute 0.0  0.0 - 0.7 K/uL   Basophils Relative 0  0 - 1 %   Basophils Absolute 0.0  0.0 - 0.1 K/uL  COMPREHENSIVE METABOLIC PANEL     Status: Abnormal   Collection Time    05/15/12  6:20 AM      Result Value Range   Sodium 133 (*) 135 - 145 mEq/L   Potassium 3.5  3.5 - 5.1 mEq/L   Chloride 101  96 - 112 mEq/L   CO2 24  19 - 32 mEq/L   Glucose, Bld 109 (*) 70 - 99 mg/dL   BUN 11  6 - 23 mg/dL   Creatinine, Ser 7.82  0.50 - 1.35 mg/dL   Calcium 7.5 (*) 8.4 - 10.5 mg/dL   Total Protein 6.7  6.0 - 8.3 g/dL   Albumin 2.0 (*) 3.5 - 5.2 g/dL   AST 85 (*) 0 - 37 U/L   ALT 59 (*) 0 - 53 U/L   Alkaline Phosphatase 116  39 - 117 U/L   Total Bilirubin 0.5  0.3 - 1.2 mg/dL   GFR calc non Af Amer >90  >90 mL/min   GFR calc Af Amer >90  >90 mL/min  LACTATE DEHYDROGENASE     Status: Abnormal   Collection Time    05/15/12  6:31 AM      Result Value Range   LDH 398 (*) 94 - 250 U/L  URINALYSIS, ROUTINE W REFLEX MICROSCOPIC     Status: Abnormal   Collection Time    05/15/12  6:40 AM      Result Value Range   Color, Urine AMBER (*) YELLOW   APPearance CLEAR  CLEAR   Specific Gravity, Urine 1.025  1.005 - 1.030   pH 7.5  5.0 - 8.0   Glucose, UA NEGATIVE  NEGATIVE mg/dL   Hgb urine dipstick NEGATIVE  NEGATIVE   Bilirubin Urine NEGATIVE  NEGATIVE   Ketones, ur NEGATIVE  NEGATIVE mg/dL   Protein, ur NEGATIVE  NEGATIVE mg/dL   Urobilinogen, UA 1.0  0.0 - 1.0 mg/dL   Nitrite NEGATIVE  NEGATIVE   Leukocytes, UA NEGATIVE  NEGATIVE    Imaging Studies: Dg Chest 2 View  05/15/2012   *RADIOLOGY REPORT*  Clinical Data: Fever, chills.  CHEST - 2 VIEW  Comparison: 05/12/2012  Findings: Lungs are predominately clear.  No pleural effusion or pneumothorax. The cardiomediastinal contours are within normal limits. The visualized bones and soft tissues are without significant appreciable abnormality.  IMPRESSION: No radiographic evidence of acute cardiopulmonary process.   Original Report Authenticated By: Jearld Lesch, M.D.  Sepsis protocol initiated. Zosyn and vancomycin IV ordered.  7:03 AM Teaching Service will see and admit patient; he was just discharged from their service.  7:24 AM Despite IV fluid bolus patient's blood pressure is now in the 70s. Upgraded to level I Sepsis Status.  7:28 AM Discussed with Dr. Delton Coombes of Pulmonary Critical Care. He will see patient but would like Teaching Service to see him as well. Liters 3 and 4 are infusing along with antibiotics. SBP 85. We'll order IV Bactrim for possible pneumocystis pneumonia.        Hanley Seamen, MD 05/15/12 [phone]

## 2012-05-15 NOTE — ED Notes (Addendum)
PT. REPORTS FEVER WITH CHILLS ONSET THIS EVENING , SEEN HERE AND ADMITTED LAST 05/12/2012 , DENIES COUGH OR SOB.

## 2012-05-15 NOTE — ED Notes (Signed)
Rapid response RN at bedside. Internal medicine resident at bedside.

## 2012-05-16 ENCOUNTER — Encounter (HOSPITAL_COMMUNITY): Payer: Self-pay | Admitting: Radiology

## 2012-05-16 DIAGNOSIS — B394 Histoplasmosis capsulati, unspecified: Secondary | ICD-10-CM

## 2012-05-16 DIAGNOSIS — B20 Human immunodeficiency virus [HIV] disease: Principal | ICD-10-CM

## 2012-05-16 DIAGNOSIS — R509 Fever, unspecified: Secondary | ICD-10-CM

## 2012-05-16 DIAGNOSIS — A419 Sepsis, unspecified organism: Secondary | ICD-10-CM

## 2012-05-16 LAB — CBC
HCT: 28.2 % — ABNORMAL LOW (ref 39.0–52.0)
Hemoglobin: 9.4 g/dL — ABNORMAL LOW (ref 13.0–17.0)
MCHC: 33.3 g/dL (ref 30.0–36.0)
MCV: 77.9 fL — ABNORMAL LOW (ref 78.0–100.0)

## 2012-05-16 LAB — COMPREHENSIVE METABOLIC PANEL
ALT: 60 U/L — ABNORMAL HIGH (ref 0–53)
AST: 86 U/L — ABNORMAL HIGH (ref 0–37)
Albumin: 2.2 g/dL — ABNORMAL LOW (ref 3.5–5.2)
Calcium: 7.8 mg/dL — ABNORMAL LOW (ref 8.4–10.5)
GFR calc Af Amer: >90 mL/min (ref >90)
Glucose, Bld: 103 mg/dL — ABNORMAL HIGH (ref 70–99)
Potassium: 4.1 mEq/L (ref 3.5–5.1)
Sodium: 131 mEq/L — ABNORMAL LOW (ref 135–145)
Total Protein: 6.8 g/dL (ref 6.0–8.3)

## 2012-05-16 LAB — RESPIRATORY VIRUS PANEL
Adenovirus: NOT DETECTED
Metapneumovirus: NOT DETECTED
Parainfluenza 1: NOT DETECTED
Parainfluenza 2: NOT DETECTED
Parainfluenza 3: NOT DETECTED
Rhinovirus: NOT DETECTED

## 2012-05-16 LAB — URINE CULTURE

## 2012-05-16 LAB — HEPATITIS PANEL, ACUTE
HCV Ab: NEGATIVE
Hep A IgM: NEGATIVE
Hep B C IgM: NEGATIVE
Hepatitis B Surface Ag: NEGATIVE

## 2012-05-16 MED ORDER — SULFAMETHOXAZOLE-TMP DS 800-160 MG PO TABS
1.0000 | ORAL_TABLET | Freq: Every day | ORAL | Status: DC
  Administered 2012-05-16 – 2012-05-17 (×2): 1 via ORAL
  Filled 2012-05-16 (×2): qty 1

## 2012-05-16 NOTE — H&P (Signed)
Internal Medicine Teaching Service Attending Note Date: 05/16/2012  Patient name: David Knapp  Medical record number: [id number]  Date of birth: [birth year]   CC: Fever  I have seen and evaluated David Knapp and discussed their care with the Residency Team.    David Knapp is a 32yo man with PMH of HIV/AIDS who presented to the ED with fever/chills X 10 days.  He presented over the weekend as well, but left due to needing to work.  He has been diagnosed with HIV/AIDS since 2008 when he presented with disseminated histo and was treated.  He was previously followed at the [facility] clinic until 2012, when [place] and was lost to follow up.  He apparently quit taking his HAART as well at that time.  Associated symptoms include weight loss of 20 pounds over 4 months and a hyperpigmented rash on his bilateral thighs for the same time.  He has warts on bilateral hands, which have been present about a year.  He otherwise denies pruritis, pain, chest pain, cough, SOB, sputum production, abdominal pain, nausea, change in bowel/bladder habits.   He is currently not on any medications.  He is a non smoker.   For further medical history, meds, allergies, please see resident note.   Physical Exam: Blood pressure 98/45, pulse 104, temperature 99.6 F (37.6 C), temperature source Oral, resp. rate 26, height 5' 6.14" (1.68 m), weight 220 lb 0.3 oz (99.8 kg), SpO2 97.00%. General appearance: alert, cooperative, appears stated age and no distress Head: Normocephalic, without obvious abnormality, atraumatic Eyes: EOMi, anicteric sclerae Lungs: clear to auscultation bilaterally Heart: RR, NR, no murmur Abdomen: soft, NT Extremities: no edema Pulses: 2+ and symmetric Skin:  Hyperpigmented patches on both anterior thighs and on left calft.  + multiple warts to bilateral hands LN: Small inguinal LN, otherwise no LN enlargement noted.  Neuro: grossly intact  Lab results: Results for orders placed during  the hospital encounter of 05/15/12 (from the past 24 hour(s))  CRYPTOCOCCAL ANTIGEN     Status: None   Collection Time    05/15/12  2:19 PM      Result Value Range   Crypto Ag NEGATIVE  NEGATIVE   Cryptococcal Ag Titer NOT INDICATED  NOT INDICATED  RPR     Status: None   Collection Time    05/16/12 12:12 AM      Result Value Range   RPR NON REACTIVE  NON REACTIVE  COMPREHENSIVE METABOLIC PANEL     Status: Abnormal   Collection Time    05/16/12  6:00 AM      Result Value Range   Sodium 131 (*) 135 - 145 mEq/L   Potassium 4.1  3.5 - 5.1 mEq/L   Chloride 102  96 - 112 mEq/L   CO2 23  19 - 32 mEq/L   Glucose, Bld 103 (*) 70 - 99 mg/dL   BUN 6  6 - 23 mg/dL   Creatinine, Ser 7.82  0.50 - 1.35 mg/dL   Calcium 7.8 (*) 8.4 - 10.5 mg/dL   Total Protein 6.8  6.0 - 8.3 g/dL   Albumin 2.2 (*) 3.5 - 5.2 g/dL   AST 86 (*) 0 - 37 U/L   ALT 60 (*) 0 - 53 U/L   Alkaline Phosphatase 122 (*) 39 - 117 U/L   Total Bilirubin 0.5  0.3 - 1.2 mg/dL   GFR calc non Af Amer >90  >90 mL/min   GFR calc Af Amer >90  >  90 mL/min  CBC     Status: Abnormal   Collection Time    05/16/12  6:00 AM      Result Value Range   WBC 1.9 (*) 4.0 - 10.5 K/uL   RBC 3.62 (*) 4.22 - 5.81 MIL/uL   Hemoglobin 9.4 (*) 13.0 - 17.0 g/dL   HCT 16.1 (*) 09.6 - 04.5 %   MCV 77.9 (*) 78.0 - 100.0 fL   MCH 26.0  26.0 - 34.0 pg   MCHC 33.3  30.0 - 36.0 g/dL   RDW 40.9  81.1 - 91.4 %   Platelets 47 (*) 150 - 400 K/uL    Imaging results:  Dg Chest 2 View  05/15/2012   *RADIOLOGY REPORT*  Clinical Data: Fever, chills.  CHEST - 2 VIEW  Comparison: 05/12/2012  Findings: Lungs are predominately clear. No pleural effusion or pneumothorax. The cardiomediastinal contours are within normal limits. The visualized bones and soft tissues are without significant appreciable abnormality.  IMPRESSION: No radiographic evidence of acute cardiopulmonary process.   Original Report Authenticated By: Jearld Lesch, M.D.    Assessment and  Plan: I agree with the formulated Assessment and Plan with the following changes:   1. AIDS with fever of unknown origin, skin rash - work up for acute fever is ongoing, ID will be consult for further direction in a patient with low CD 4 count - Work up will be directed toward likely organisms including MAI, PCP, disseminated AFB.  Also will check for syphilis, histo.  - F/U ID consult - Started on zithromax and bactrim at ppx doses; given zosyn/vanc in ED - Check HIV viral load, genotype, CD4 count  Other issues per resident note.   David Catalina, MD 5/15/201411:12 AM

## 2012-05-16 NOTE — Progress Notes (Signed)
Report called to [place], [place] RN. Pt to transfer to [id number] via bed, no IVFs, 2L O2. VS stable at time of transfer. Belongings at bedside. Family at bedside and aware of new room number. Meds in chart. No current questions or complaints at this time. David Knapp

## 2012-05-16 NOTE — Progress Notes (Signed)
INFECTIOUS DISEASE PROGRESS NOTE  ID: David Knapp is a 32 y.o. male with  Principal Problem:   AIDS Active Problems:   HIV DISEASE   WARTS, BOTH HANDS   Pancytopenia   Fever   SIRS (systemic inflammatory response syndrome)  Subjective: Without complaints. No loose BM.   Abtx:  Anti-infectives   Start     Dose/Rate Route Frequency Ordered Stop   05/16/12 1200  sulfamethoxazole-trimethoprim (BACTRIM DS) 800-160 MG per tablet 1 tablet     1 tablet Oral Daily 05/16/12 1116     05/15/12 1200  azithromycin (ZITHROMAX) tablet 1,200 mg     1,200 mg Oral Weekly 05/15/12 0954     05/15/12 1000  sulfamethoxazole-trimethoprim (BACTRIM,SEPTRA) 400-80 MG per tablet 2 tablet  Status:  Discontinued     2 tablet Oral Daily 05/15/12 0954 05/15/12 1044   05/15/12 0900  sulfamethoxazole-trimethoprim (BACTRIM) 374.4 mg in dextrose 5 % 500 mL IVPB  Status:  Discontinued     15 mg/kg/day  99.8 kg 348.9 mL/hr over 90 Minutes Intravenous 4 times per day 05/15/12 0745 05/16/12 1116   05/15/12 0600  piperacillin-tazobactam (ZOSYN) IVPB 4.5 g     4.5 g 200 mL/hr over 30 Minutes Intravenous  Once 05/15/12 0551 05/15/12 0644   05/15/12 0600  vancomycin (VANCOCIN) IVPB 1000 mg/200 mL premix     1,000 mg 200 mL/hr over 60 Minutes Intravenous  Once 05/15/12 0551 05/15/12 0850      Medications:  Scheduled: . azithromycin  1,200 mg Oral Weekly  . sulfamethoxazole-trimethoprim  1 tablet Oral Daily    Objective: Vital signs in last 24 hours: Temp:  [98 F (36.7 C)-100.3 F (37.9 C)] 100 F (37.8 C) (05/15 1657) Pulse Rate:  [97-107] 101 (05/15 1657) Resp:  [19-35] 22 (05/15 1657) BP: (94-121)/(43-63) 110/53 mmHg (05/15 1657) SpO2:  [96 %-100 %] 98 % (05/15 1657)   General appearance: alert, cooperative and no distress Throat: normal findings: oropharynx pink & moist without lesions or evidence of thrush Resp: clear to auscultation bilaterally Cardio: regular rate and rhythm GI: normal  findings: bowel sounds normal and soft, non-tender  Lab Results  Recent Labs  05/15/12 0508 05/15/12 0620 05/16/12 0600  WBC 1.8*  --  1.9*  HGB 9.9*  --  9.4*  HCT 29.9*  --  28.2*  NA  --  133* 131*  K  --  3.5 4.1  CL  --  101 102  CO2  --  24 23  BUN  --  11 6  CREATININE  --  0.72 0.97   Liver Panel  Recent Labs  05/15/12 0620 05/16/12 0600  PROT 6.7 6.8  ALBUMIN 2.0* 2.2*  AST 85* 86*  ALT 59* 60*  ALKPHOS 116 122*  BILITOT 0.5 0.5   Sedimentation Rate No results found for this basename: ESRSEDRATE,  in the last 72 hours C-Reactive Protein No results found for this basename: CRP,  in the last 72 hours  Microbiology: Recent Results (from the past 240 hour(s))  URINE CULTURE     Status: None   Collection Time    05/12/12  8:48 AM      Result Value Range Status   Specimen Description URINE, RANDOM   Final   Special Requests NONE   Final   Culture  Setup Time 05/12/2012 19:58   Final   Colony Count NO GROWTH   Final   Culture NO GROWTH   Final   Report Status 05/13/2012 FINAL  Final  CULTURE, BLOOD (ROUTINE X 2)     Status: None   Collection Time    05/12/12  9:23 AM      Result Value Range Status   Specimen Description BLOOD RIGHT HAND   Final   Special Requests BOTTLES DRAWN AEROBIC ONLY 8CC   Final   Culture  Setup Time 05/12/2012 18:46   Final   Culture     Final   Value:        BLOOD CULTURE RECEIVED NO GROWTH TO DATE CULTURE WILL BE HELD FOR 5 DAYS BEFORE ISSUING A FINAL NEGATIVE REPORT   Report Status PENDING   Incomplete  CULTURE, BLOOD (ROUTINE X 2)     Status: None   Collection Time    05/12/12  9:25 AM      Result Value Range Status   Specimen Description BLOOD RIGHT ARM   Final   Special Requests BOTTLES DRAWN AEROBIC ONLY 10CC   Final   Culture  Setup Time 05/12/2012 18:46   Final   Culture     Final   Value:        BLOOD CULTURE RECEIVED NO GROWTH TO DATE CULTURE WILL BE HELD FOR 5 DAYS BEFORE ISSUING A FINAL NEGATIVE REPORT    Report Status PENDING   Incomplete  AFB CULTURE, BLOOD     Status: None   Collection Time    05/12/12  2:03 PM      Result Value Range Status   Specimen Description BLOOD RIGHT ARM   Final   Special Requests BOTTLES DRAWN AEROBIC ONLY 5CC   Final   Culture     Final   Value: CULTURE WILL BE EXAMINED FOR 6 WEEKS BEFORE ISSUING A FINAL REPORT   Report Status PENDING   Incomplete  CULTURE, BLOOD (ROUTINE X 2)     Status: None   Collection Time    05/15/12  5:35 AM      Result Value Range Status   Specimen Description BLOOD LEFT ARM   Final   Special Requests BOTTLES DRAWN AEROBIC ONLY 10CC   Final   Culture  Setup Time 05/15/2012 09:09   Final   Culture     Final   Value:        BLOOD CULTURE RECEIVED NO GROWTH TO DATE CULTURE WILL BE HELD FOR 5 DAYS BEFORE ISSUING A FINAL NEGATIVE REPORT   Report Status PENDING   Incomplete  CULTURE, BLOOD (ROUTINE X 2)     Status: None   Collection Time    05/15/12  5:40 AM      Result Value Range Status   Specimen Description BLOOD LEFT HAND   Final   Special Requests BOTTLES DRAWN AEROBIC ONLY 10CC   Final   Culture  Setup Time 05/15/2012 09:09   Final   Culture     Final   Value:        BLOOD CULTURE RECEIVED NO GROWTH TO DATE CULTURE WILL BE HELD FOR 5 DAYS BEFORE ISSUING A FINAL NEGATIVE REPORT   Report Status PENDING   Incomplete  FUNGUS CULTURE, BLOOD     Status: None   Collection Time    05/15/12  5:40 AM      Result Value Range Status   Specimen Description BLOOD LEFT HAND   Final   Special Requests BOTTLES DRAWN AEROBIC ONLY 10CC   Final   Culture NO FUNGUS ISOLATED;CULTURE IN PROGRESS FOR 7 DAYS   Final   Report Status PENDING  Incomplete  URINE CULTURE     Status: None   Collection Time    05/15/12  6:40 AM      Result Value Range Status   Specimen Description URINE, RANDOM   Final   Special Requests NONE   Final   Culture  Setup Time 05/15/2012 12:50   Final   Colony Count NO GROWTH   Final   Culture NO GROWTH   Final    Report Status 05/16/2012 FINAL   Final  GC/CHLAMYDIA PROBE AMP     Status: None   Collection Time    05/15/12  6:40 AM      Result Value Range Status   CT Probe RNA NEGATIVE  NEGATIVE Final   GC Probe RNA NEGATIVE  NEGATIVE Final   Comment: (NOTE)                                                                                              Normal Reference Range: Negative          Assay performed using the Gen-Probe APTIMA COMBO2 (R) Assay.     Acceptable specimen types for this assay include APTIMA Swabs (Unisex,     endocervical, urethral, or vaginal), first void urine, and ThinPrep     liquid based cytology samples.  MRSA PCR SCREENING     Status: None   Collection Time    05/15/12 10:40 AM      Result Value Range Status   MRSA by PCR NEGATIVE  NEGATIVE Final   Comment:            The GeneXpert MRSA Assay (FDA     approved for NASAL specimens     only), is one component of a     comprehensive MRSA colonization     surveillance program. It is not     intended to diagnose MRSA     infection nor to guide or     monitor treatment for     MRSA infections.    Studies/Results: Dg Chest 2 View  05/15/2012   *RADIOLOGY REPORT*  Clinical Data: Fever, chills.  CHEST - 2 VIEW  Comparison: 05/12/2012  Findings: Lungs are predominately clear. No pleural effusion or pneumothorax. The cardiomediastinal contours are within normal limits. The visualized bones and soft tissues are without significant appreciable abnormality.  IMPRESSION: No radiographic evidence of acute cardiopulmonary process.   Original Report Authenticated By: Jearld Lesch, M.D.     Assessment/Plan: AIDS Fever (improved) Hx of histo at dx   for BM Bx in Am, then to start MAI empiric therapy Would stop azithro  Would not start ART for at least 1 week after this to decrease chance of IRIS.  Please send BM Bx for path, afb, routine and fungal Cx's as well as pathology Will check urine histo, fungal ab  panel Hepatitis A/B/C (-), RPR/GC/chlamydia (-), serum crypto (-)   Johny Sax Infectious Diseases [phone] 05/16/2012, 6:28 PM   LOS: 1 day

## 2012-05-16 NOTE — Progress Notes (Signed)
Subjective: No complaints today. States he is feeling a little hot in his room today. Through the translator phone, pt states that his wife know he has HIV/AIDS. He states that they are sexually active but have been using protection. He does not want the medical team to discuss his illness with the wife, stating "it's private". He did give [place] permission to tell the wife that she needs to be tested for HIV, but not discuss his illness with her.  Objective: Vital signs in last 24 hours: Filed Vitals:   05/16/12 0347 05/16/12 0400 05/16/12 0500 05/16/12 0755  BP: 94/53 99/55 103/56 98/45  Pulse: 97 99  104  Temp: 98.7 F (37.1 C)   99.6 F (37.6 C)  TempSrc: Oral   Oral  Resp: 29 33 34 26  Height:      Weight:      SpO2: 97% 96%  97%   Weight change:   Intake/Output Summary (Last 24 hours) at 05/16/12 0858 Last data filed at 05/16/12 0552  Gross per 24 hour  Intake 3233.95 ml  Output   2325 ml  Net 908.95 ml   Vitals reviewed. General: Resting in bed, NAD HEENT: PERRL, EOMI Cardiac: RRR, no rubs, murmurs or gallops Pulm: Clear to auscultation bilaterally, no wheezes, rales, or rhonchi Abd: Soft, nontender, nondistended, BS present Ext: Warts on bilateral hands, psoriatic-like lesions on bilateral thighs and left lateral calf. Warm and well perfused, no pedal edema Neuro: Alert and oriented X3, cranial nerves II-XII grossly intact, strength and sensation to light touch equal in bilateral upper and lower extremities  Lab Results: Basic Metabolic Panel:  Recent Labs Lab 05/15/12 0620 05/16/12 0600  NA 133* 131*  K 3.5 4.1  CL 101 102  CO2 24 23  GLUCOSE 109* 103*  BUN 11 6  CREATININE 0.72 0.97  CALCIUM 7.5* 7.8*   Liver Function Tests:  Recent Labs Lab 05/15/12 0620 05/16/12 0600  AST 85* 86*  ALT 59* 60*  ALKPHOS 116 122*  BILITOT 0.5 0.5  PROT 6.7 6.8  ALBUMIN 2.0* 2.2*   CBC:  Recent Labs Lab 05/12/12 0748 05/15/12 0508 05/16/12 0600  WBC 1.8*  1.8* 1.9*  NEUTROABS 1.4* 1.2*  --   HGB 9.8* 9.9* 9.4*  HCT 28.8* 29.9* 28.2*  MCV 77.6* 77.1* 77.9*  PLT 55* 53* 47*   Urine Drug Screen: Drugs of Abuse     Component Value Date/Time   LABOPIA NONE DETECTED 05/15/2012 1054   COCAINSCRNUR NONE DETECTED 05/15/2012 1054   LABBENZ NONE DETECTED 05/15/2012 1054   AMPHETMU NONE DETECTED 05/15/2012 1054   THCU NONE DETECTED 05/15/2012 1054   LABBARB NONE DETECTED 05/15/2012 1054    AUrinalysis:  Recent Labs Lab 05/12/12 0848 05/15/12 0640  COLORURINE YELLOW AMBER*  LABSPEC 1.021 1.025  PHURINE 6.5 7.5  GLUCOSEU NEGATIVE NEGATIVE  HGBUR NEGATIVE NEGATIVE  BILIRUBINUR NEGATIVE NEGATIVE  KETONESUR NEGATIVE NEGATIVE  PROTEINUR NEGATIVE NEGATIVE  UROBILINOGEN 1.0 1.0  NITRITE NEGATIVE NEGATIVE  LEUKOCYTESUR NEGATIVE NEGATIVE   Misc. Labs: 05/16/12: LDH: 398 Crypto Ag: Negative RPR: Negative CMV: penidng Histo: pending RMSF: pending RSV panel: pending AFB blood cx: pending Quantiferon gold assay: pending  Micro Results: Recent Results (from the past 240 hour(s))  URINE CULTURE     Status: None   Collection Time    05/12/12  8:48 AM      Result Value Range Status   Specimen Description URINE, RANDOM   Final   Special Requests NONE   Final  Culture  Setup Time 05/12/2012 19:58   Final   Colony Count NO GROWTH   Final   Culture NO GROWTH   Final   Report Status 05/13/2012 FINAL   Final  CULTURE, BLOOD (ROUTINE X 2)     Status: None   Collection Time    05/12/12  9:23 AM      Result Value Range Status   Specimen Description BLOOD RIGHT HAND   Final   Special Requests BOTTLES DRAWN AEROBIC ONLY 8CC   Final   Culture  Setup Time 05/12/2012 18:46   Final   Culture     Final   Value:        BLOOD CULTURE RECEIVED NO GROWTH TO DATE CULTURE WILL BE HELD FOR 5 DAYS BEFORE ISSUING A FINAL NEGATIVE REPORT   Report Status PENDING   Incomplete  CULTURE, BLOOD (ROUTINE X 2)     Status: None   Collection Time    05/12/12  9:25  AM      Result Value Range Status   Specimen Description BLOOD RIGHT ARM   Final   Special Requests BOTTLES DRAWN AEROBIC ONLY 10CC   Final   Culture  Setup Time 05/12/2012 18:46   Final   Culture     Final   Value:        BLOOD CULTURE RECEIVED NO GROWTH TO DATE CULTURE WILL BE HELD FOR 5 DAYS BEFORE ISSUING A FINAL NEGATIVE REPORT   Report Status PENDING   Incomplete  AFB CULTURE, BLOOD     Status: None   Collection Time    05/12/12  2:03 PM      Result Value Range Status   Specimen Description BLOOD RIGHT ARM   Final   Special Requests BOTTLES DRAWN AEROBIC ONLY 5CC   Final   Culture     Final   Value: CULTURE WILL BE EXAMINED FOR 6 WEEKS BEFORE ISSUING A FINAL REPORT   Report Status PENDING   Incomplete  MRSA PCR SCREENING     Status: None   Collection Time    05/15/12 10:40 AM      Result Value Range Status   MRSA by PCR NEGATIVE  NEGATIVE Final   Comment:            The GeneXpert MRSA Assay (FDA     approved for NASAL specimens     only), is one component of a     comprehensive MRSA colonization     surveillance program. It is not     intended to diagnose MRSA     infection nor to guide or     monitor treatment for     MRSA infections.   Studies/Results: Dg Chest 2 View  05/15/2012   *RADIOLOGY REPORT*  Clinical Data: Fever, chills.  CHEST - 2 VIEW  Comparison: 05/12/2012  Findings: Lungs are predominately clear. No pleural effusion or pneumothorax. The cardiomediastinal contours are within normal limits. The visualized bones and soft tissues are without significant appreciable abnormality.  IMPRESSION: No radiographic evidence of acute cardiopulmonary process.   Original Report Authenticated By: Jearld Lesch, M.D.   Medications: I have reviewed the patient's current medications. Scheduled Meds: . sodium chloride  1,000 mL Intravenous Once  . azithromycin  1,200 mg Oral Weekly  . sulfamethoxazole-trimethoprim  15 mg/kg/day Intravenous Q6H   Continuous  Infusions: . sodium chloride 1,000 mL (05/15/12 0649)   PRN Meds:.alum & mag hydroxide-simeth, ondansetron (ZOFRAN) IV, ondansetron  Assessment/Plan: 32 year old gentleman with  past medical history significant for AIDS with CD4 count less than 10, medication noncompliance presents to the ED with fevers and chills for 10 days.   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 He was admitted for <12hrs on 5/11 with intermittent fevers and pancytopenia a CD4 and VL were checked and CD4 <10 and HIV VL 62,399, Ucx negative and Bcx NTD. He returns with continued fevers, with etiology possible again due to disseminated histoplasmosis vs disseminated TB or MAC. Lymphoma is also a possibility. Crypto antigen negative, RPR negative. Checking blood and urine cultures, RMS fever Abs, histoplasma antigen, hepititis panel, CMV PCR, ehrlichia panel, GC/chlam, HIV genotype, Bcx for AFB and fungal cultures, and quantiferon gold which are all pending. Blood and urine cultures pending. He was started on IV bactrim 374.4 mg, zosyn 4.5 mg x1, vanc 1 g x1, and PO azithromycin 1.2 g in ED, but with a normal CXR and no respiratory sx, he likely does not have PCP, so changing Bactrim to po prophylactic dose. Dr. Ninetta Lights with ID is on board and recommends a bone marrow biopsy for further evaluation. Will initiate MAI tx after biopsy. - F/u with Infectious Disease, appreciate recs.  - BM bx in the morning - NPO @[facility]  for bone marrow bx with IR today. See ID note for details.  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 Previously seen in the ID clinic, with this last visit 7/12 with CD4 310 and undetectable VL on ART. No ART in over 1 year b/c he moved away and was lost to follow up. CD4 <10 and HIV VL 62,399 on 5/11. He is on PCP prophylaxis with bactrim 160 mg daily. Obtaining BM bx, then beginning MAI treatment and ART. -  Initiate ART after initiating MAI tx.   Skin rash: When diagnosed with AIDS in '08, with rash on his legs and dx with disseminated histoplasmosis. Presents this time with skin rash on bilateral thighs and left lateral calf x 3 months prior to admission, which is similar to previous rash at diagnosis. - Checking for histoplasmosis   Pancytopenia:  WBC 1.8, platelets 53, H&H 9.9/29.9. Today WBC with mild increase up to 1.9 from 1.8. H/H and platelets mildly decreased, possibly dilutional from, IVF.  - AM CBC  Hyponatremia:  Na trending down to 131 from 133. Asymptomatic. Likely dilutional 2/2 NS @ 125 cc/hr overnight. Heplocking IV today and will start a regular diet and be NPO @[facility]  for his bx..  -BMP in AM.   Hypoalbuminemia:  Likely 2/2 chronic disease. Corrected Ca = 9.2. Regular diet. Encouraging PO intake.   Prophylaxis: SCDs   Dispo: Disposition is deferred at this time, awaiting improvement of current medical problems.  Anticipated discharge in approximately 2-3 day(s).   The patient does not have a current PCP (No PCP Per Patient), therefore will be  followed-up after discharge by ID clinic.   The patient does not have transportation limitations that hinder transportation to clinic appointments.  .Services Needed at time of discharge: Y = Yes, Blank = No PT:   OT:   RN:   Equipment:   Other:     LOS: 1 day   Genelle Gather 05/16/2012, 8:58 AM

## 2012-05-16 NOTE — Consult Note (Signed)
PULMONARY  / CRITICAL CARE MEDICINE  Name: Yvonne Petite MRN: [id number] DOB: October 09, 1980    ADMISSION DATE:  05/15/2012 CONSULTATION DATE:  5/14  REFERRING MD :  Teaching service  PRIMARY SERVICE:  Teaching service  CHIEF COMPLAINT:  sepsis  BRIEF PATIENT DESCRIPTION: 32yo male with hx HIV presented 5/14 with 8 day hx fever.  Mildly hypotensive in ER and code sepsis called, PCCM consulted.   SIGNIFICANT EVENTS / STUDIES:   5/11 Head CT>>>normal  LINES / TUBES: none  CULTURES: BCx2 5/14>>> Urine 5/14>>> AFB 5/15>>> GC/chlamydia>>>  ANTIBIOTICS: Vanc 5/14>>>5/15 Zosyn 5/14>>>5/15 Bactrim 5/14>>> Azithromycin 5/14>>>  VITAL SIGNS: Temp:  [98 F (36.7 C)-100.3 F (37.9 C)] 100.3 F (37.9 C) (05/15 1206) Pulse Rate:  [79-107] 102 (05/15 1206) Resp:  [19-35] 22 (05/15 1206) BP: (93-121)/(43-76) 95/43 mmHg (05/15 1206) SpO2:  [96 %-100 %] 97 % (05/15 1206)  PHYSICAL EXAMINATION: General:  wdwn male NAD Neuro:  Awake, alert, appropriate, MAE HEENT:  Mm dry, poor dentition  Cardiovascular:  s1s2 rrr, mild tachy Lungs:  resps even non labored, posterior crackles  Abdomen:  Abd soft, +bs Ext: warm and dry, L lateral lower ext with dry, thickened, excema-like patches, no edema    Recent Labs Lab 05/12/12 0748 05/15/12 0620 05/16/12 0600  NA 131* 133* 131*  K 3.6 3.5 4.1  CL 101 101 102  CO2 24 24 23   BUN 12 11 6   CREATININE 0.74 0.72 0.97  GLUCOSE 107* 109* 103*    Recent Labs Lab 05/12/12 0748 05/15/12 0508 05/16/12 0600  HGB 9.8* 9.9* 9.4*  HCT 28.8* 29.9* 28.2*  WBC 1.8* 1.8* 1.9*  PLT 55* 53* 47*   Dg Chest 2 View  05/15/2012   *RADIOLOGY REPORT*  Clinical Data: Fever, chills.  CHEST - 2 VIEW  Comparison: 05/12/2012  Findings: Lungs are predominately clear. No pleural effusion or pneumothorax. The cardiomediastinal contours are within normal limits. The visualized bones and soft tissues are without significant appreciable abnormality.  IMPRESSION: No  radiographic evidence of acute cardiopulmonary process.   Original Report Authenticated By: Jearld Lesch, M.D.    ASSESSMENT / PLAN:  Fever  HIV/AIDS  Pancytopenia  Hyponatremia - mild  Hypotension - mild  SIRS   PLAN -  - Consider move to floor - cont gentle volume  - abx, per ID - f/u cultures -lactic acid reassuring, mentation good, continues to make maintenance urine output (greater 0.5 cc/kg/hr) = no role line or further aggressive resuscitation -WOULD NOT use PCT algo with immune suppression -likely BM invasion, infectious -pcp usually does not present with shock, per ID work up -ensure type and cross, cbc in am   PCCM will sign off  Gery Pray, PA-S  *Care during the described time interval was provided by me and/or other providers on the critical care team. I have reviewed this patient's available data, including medical history, events of note, physical examination and test results as part of my evaluation.  Mcarthur Rossetti. Tyson Alias, MD, FACP Pgr: [phone] [facility]

## 2012-05-16 NOTE — H&P (Signed)
HPI: David Knapp is an 32 y.o. male diagnosed with HIV/AIDS and has fever. He is also pancytopenic. ID has seen the pt and requested bone marrow biopsy for cultures and evaluation. PMHx and meds reviewed. Discussion with pt and Interpreter service.   Past Medical History:  Past Medical History  Diagnosis Date  . HIV (human immunodeficiency virus infection)     Past Surgical History: History reviewed. No pertinent past surgical history.  Family History: History reviewed. No pertinent family history.  Social History:  reports that he has never smoked. He has never used smokeless tobacco. He reports that he drinks about 3.0 ounces of alcohol per week. He reports that he does not use illicit drugs.  Allergies: No Known Allergies  Medications: No prescriptions prior to admission    Please HPI for pertinent positives, otherwise complete 10 system ROS negative.  Physical Exam: Blood pressure 95/43, pulse 102, temperature 100.3 F (37.9 C), temperature source Oral, resp. rate 22, height 5' 6.14" (1.68 m), weight 220 lb 0.3 oz (99.8 kg), SpO2 97.00%. Body mass index is 35.36 kg/(m^2).   General Appearance:  Alert, cooperative, no distress, appears stated age  Head:  Normocephalic, without obvious abnormality, atraumatic  ENT: Unremarkable  Neck: Supple, symmetrical, trachea midline,  Lungs:   Clear to auscultation bilaterally, no w/r/r, respirations unlabored without use of accessory muscles.  Chest Wall:  No tenderness or deformity  Heart:  Regular rate and rhythm, S1, S2 normal, no murmur, rub or gallop.  Neurologic: Normal affect, no gross deficits.   Results for orders placed during the hospital encounter of 05/15/12 (from the past 48 hour(s))  LACTIC ACID, PLASMA     Status: None   Collection Time    05/15/12  5:04 AM      Result Value Range   Lactic Acid, Venous 0.9  0.5 - 2.2 mmol/L  CBC WITH DIFFERENTIAL     Status: Abnormal   Collection Time    05/15/12  5:08 AM    Result Value Range   WBC 1.8 (*) 4.0 - 10.5 K/uL   RBC 3.88 (*) 4.22 - 5.81 MIL/uL   Hemoglobin 9.9 (*) 13.0 - 17.0 g/dL   HCT 16.1 (*) 09.6 - 04.5 %   MCV 77.1 (*) 78.0 - 100.0 fL   MCH 25.5 (*) 26.0 - 34.0 pg   MCHC 33.1  30.0 - 36.0 g/dL   RDW 40.9  81.1 - 91.4 %   Platelets 53 (*) 150 - 400 K/uL   Comment: CONSISTENT WITH PREVIOUS RESULT   Neutrophils Relative % 67  43 - 77 %   Neutro Abs 1.2 (*) 1.7 - 7.7 K/uL   Lymphocytes Relative 22  12 - 46 %   Lymphs Abs 0.4 (*) 0.7 - 4.0 K/uL   Monocytes Relative 11  3 - 12 %   Monocytes Absolute 0.2  0.1 - 1.0 K/uL   Eosinophils Relative 1  0 - 5 %   Eosinophils Absolute 0.0  0.0 - 0.7 K/uL   Basophils Relative 0  0 - 1 %   Basophils Absolute 0.0  0.0 - 0.1 K/uL  T-HELPER CELLS (CD4) COUNT     Status: Abnormal   Collection Time    05/15/12  5:08 AM      Result Value Range   CD4 T Cell Abs <10 (*) 400 - 2700 cmm   CD4 % Helper T Cell 1 (*) 33 - 55 %  COMPREHENSIVE METABOLIC PANEL     Status: Abnormal  Collection Time    05/15/12  6:20 AM      Result Value Range   Sodium 133 (*) 135 - 145 mEq/L   Potassium 3.5  3.5 - 5.1 mEq/L   Chloride 101  96 - 112 mEq/L   CO2 24  19 - 32 mEq/L   Glucose, Bld 109 (*) 70 - 99 mg/dL   BUN 11  6 - 23 mg/dL   Creatinine, Ser 8.11  0.50 - 1.35 mg/dL   Calcium 7.5 (*) 8.4 - 10.5 mg/dL   Total Protein 6.7  6.0 - 8.3 g/dL   Albumin 2.0 (*) 3.5 - 5.2 g/dL   AST 85 (*) 0 - 37 U/L   ALT 59 (*) 0 - 53 U/L   Alkaline Phosphatase 116  39 - 117 U/L   Total Bilirubin 0.5  0.3 - 1.2 mg/dL   GFR calc non Af Amer >90  >90 mL/min   GFR calc Af Amer >90  >90 mL/min   Comment:            The eGFR has been calculated     using the CKD EPI equation.     This calculation has not been     validated in all clinical     situations.     eGFR's persistently     <90 mL/min signify     possible Chronic Kidney Disease.  PROCALCITONIN     Status: None   Collection Time    05/15/12  6:20 AM      Result Value  Range   Procalcitonin 0.26     Comment:            Interpretation:     PCT (Procalcitonin) <= 0.5 ng/mL:     Systemic infection (sepsis) is not likely.     Local bacterial infection is possible.     (NOTE)             ICU PCT Algorithm               Non ICU PCT Algorithm        ----------------------------     ------------------------------             PCT < 0.25 ng/mL                 PCT < 0.1 ng/mL         Stopping of antibiotics            Stopping of antibiotics           strongly encouraged.               strongly encouraged.        ----------------------------     ------------------------------           PCT level decrease by               PCT < 0.25 ng/mL           >= 80% from peak PCT           OR PCT 0.25 - 0.5 ng/mL          Stopping of antibiotics                                                 encouraged.  Stopping of antibiotics               encouraged.        ----------------------------     ------------------------------           PCT level decrease by              PCT >= 0.25 ng/mL           < 80% from peak PCT            AND PCT >= 0.5 ng/mL            Continuing antibiotics                                                  encouraged.           Continuing antibiotics                encouraged.        ----------------------------     ------------------------------         PCT level increase compared          PCT > 0.5 ng/mL             with peak PCT AND              PCT >= 0.5 ng/mL             Escalation of antibiotics                                              strongly encouraged.          Escalation of antibiotics            strongly encouraged.  LACTATE DEHYDROGENASE     Status: Abnormal   Collection Time    05/15/12  6:31 AM      Result Value Range   LDH 398 (*) 94 - 250 U/L  URINALYSIS, ROUTINE W REFLEX MICROSCOPIC     Status: Abnormal   Collection Time    05/15/12  6:40 AM      Result Value Range   Color, Urine AMBER (*) YELLOW   Comment:  BIOCHEMICALS MAY BE AFFECTED BY COLOR   APPearance CLEAR  CLEAR   Specific Gravity, Urine 1.025  1.005 - 1.030   pH 7.5  5.0 - 8.0   Glucose, UA NEGATIVE  NEGATIVE mg/dL   Hgb urine dipstick NEGATIVE  NEGATIVE   Bilirubin Urine NEGATIVE  NEGATIVE   Ketones, ur NEGATIVE  NEGATIVE mg/dL   Protein, ur NEGATIVE  NEGATIVE mg/dL   Urobilinogen, UA 1.0  0.0 - 1.0 mg/dL   Nitrite NEGATIVE  NEGATIVE   Leukocytes, UA NEGATIVE  NEGATIVE   Comment: MICROSCOPIC NOT DONE ON URINES WITH NEGATIVE PROTEIN, BLOOD, LEUKOCYTES, NITRITE, OR GLUCOSE <1000 mg/dL.  MRSA PCR SCREENING     Status: None   Collection Time    05/15/12 10:40 AM      Result Value Range   MRSA by PCR NEGATIVE  NEGATIVE   Comment:            The GeneXpert MRSA Assay (FDA     approved for NASAL specimens     only), is  one component of a     comprehensive MRSA colonization     surveillance program. It is not     intended to diagnose MRSA     infection nor to guide or     monitor treatment for     MRSA infections.  URINE RAPID DRUG SCREEN (HOSP PERFORMED)     Status: None   Collection Time    05/15/12 10:54 AM      Result Value Range   Opiates NONE DETECTED  NONE DETECTED   Cocaine NONE DETECTED  NONE DETECTED   Benzodiazepines NONE DETECTED  NONE DETECTED   Amphetamines NONE DETECTED  NONE DETECTED   Tetrahydrocannabinol NONE DETECTED  NONE DETECTED   Barbiturates NONE DETECTED  NONE DETECTED   Comment:            DRUG SCREEN FOR MEDICAL PURPOSES     ONLY.  IF CONFIRMATION IS NEEDED     FOR ANY PURPOSE, NOTIFY LAB     WITHIN 5 DAYS.                LOWEST DETECTABLE LIMITS     FOR URINE DRUG SCREEN     Drug Class       Cutoff (ng/mL)     Amphetamine      1000     Barbiturate      200     Benzodiazepine   200     Tricyclics       300     Opiates          300     Cocaine          300     THC              50  HEPATITIS PANEL, ACUTE     Status: None   Collection Time    05/15/12 11:10 AM      Result Value Range    Hepatitis B Surface Ag NEGATIVE  NEGATIVE   HCV Ab NEGATIVE  NEGATIVE   Hep A IgM PENDING  NEGATIVE   Hep B C IgM PENDING  NEGATIVE  CRYPTOCOCCAL ANTIGEN     Status: None   Collection Time    05/15/12  2:19 PM      Result Value Range   Crypto Ag NEGATIVE  NEGATIVE   Cryptococcal Ag Titer NOT INDICATED  NOT INDICATED  RPR     Status: None   Collection Time    05/16/12 12:12 AM      Result Value Range   RPR NON REACTIVE  NON REACTIVE  COMPREHENSIVE METABOLIC PANEL     Status: Abnormal   Collection Time    05/16/12  6:00 AM      Result Value Range   Sodium 131 (*) 135 - 145 mEq/L   Potassium 4.1  3.5 - 5.1 mEq/L   Chloride 102  96 - 112 mEq/L   CO2 23  19 - 32 mEq/L   Glucose, Bld 103 (*) 70 - 99 mg/dL   BUN 6  6 - 23 mg/dL   Creatinine, Ser 1.61  0.50 - 1.35 mg/dL   Calcium 7.8 (*) 8.4 - 10.5 mg/dL   Total Protein 6.8  6.0 - 8.3 g/dL   Albumin 2.2 (*) 3.5 - 5.2 g/dL   AST 86 (*) 0 - 37 U/L   ALT 60 (*) 0 - 53 U/L   Alkaline Phosphatase 122 (*) 39 - 117 U/L   Total Bilirubin  0.5  0.3 - 1.2 mg/dL   GFR calc non Af Amer >90  >90 mL/min   GFR calc Af Amer >90  >90 mL/min   Comment:            The eGFR has been calculated     using the CKD EPI equation.     This calculation has not been     validated in all clinical     situations.     eGFR's persistently     <90 mL/min signify     possible Chronic Kidney Disease.  CBC     Status: Abnormal   Collection Time    05/16/12  6:00 AM      Result Value Range   WBC 1.9 (*) 4.0 - 10.5 K/uL   RBC 3.62 (*) 4.22 - 5.81 MIL/uL   Hemoglobin 9.4 (*) 13.0 - 17.0 g/dL   HCT 29.5 (*) 62.1 - 30.8 %   MCV 77.9 (*) 78.0 - 100.0 fL   MCH 26.0  26.0 - 34.0 pg   MCHC 33.3  30.0 - 36.0 g/dL   RDW 65.7  84.6 - 96.2 %   Platelets 47 (*) 150 - 400 K/uL   Comment: CONSISTENT WITH PREVIOUS RESULT   Dg Chest 2 View  05/15/2012   *RADIOLOGY REPORT*  Clinical Data: Fever, chills.  CHEST - 2 VIEW  Comparison: 05/12/2012  Findings: Lungs are  predominately clear. No pleural effusion or pneumothorax. The cardiomediastinal contours are within normal limits. The visualized bones and soft tissues are without significant appreciable abnormality.  IMPRESSION: No radiographic evidence of acute cardiopulmonary process.   Original Report Authenticated By: Jearld Lesch, M.D.    Assessment/Plan HIV/AIDS Fever For Cy guided BM biopsy tomorrow ~0900. Discussed procedure, risks, complications, use of sedation Labs reviewed Consent signed in chart  Brayton El PA-C 05/16/2012, 1:26 PM

## 2012-05-16 NOTE — Progress Notes (Signed)
Medical Student Daily Progress Note  Subjective: NAE overnight.  Pt c/o feeling hot this AM but otherwise has no complaints.  Denies pain. Per RN, pt's wife was enquiring about the need to get "blood tests" done.  Pt stated he did not want to discuss his current condition with his wife but was willing to encourage his wife to get HIV testing.  He gave permission via interpreter to say to his wife, "we cannot discuss your husbands condition with you but he gave [place] permission to tell you that you should be tested for HIV".  Repeated this to wife in conference room via interpreter, who appeared to understand.     Objective: Vital signs in last 24 hours: Filed Vitals:   05/16/12 0347 05/16/12 0400 05/16/12 0500 05/16/12 0755  BP: 94/53 99/55 103/56 98/45  Pulse: 97 99  104  Temp: 98.7 F (37.1 C)   99.6 F (37.6 C)  TempSrc: Oral   Oral  Resp: 29 33 34 26  Height:      Weight:      SpO2: 97% 96%  97%   Weight change:   Intake/Output Summary (Last 24 hours) at 05/16/12 0844 Last data filed at 05/16/12 0552  Gross per 24 hour  Intake 5433.95 ml  Output   2325 ml  Net 3108.95 ml    Physical Exam: BP 98/45  Pulse 104  Temp(Src) 99.6 F (37.6 C) (Oral)  Resp 26  Ht 5' 6.14" (1.68 m)  Wt 99.8 kg (220 lb 0.3 oz)  BMI 35.36 kg/m2  SpO2 97% General appearance: alert, cooperative, appears stated age and no distress. Laying in bed. Head: Normocephalic, without obvious abnormality, atraumatic  Eyes: EOMI. Sclera clear.  Throat: mucosa erythematous Neck: no adenopathy, supple, symmetrical, trachea midline and thyroid not enlarged, symmetric, no tenderness/mass/nodules  Back: no skin lesions, erythema, or scars  Lungs: clear to auscultation bilaterally  Heart: regular rate and rhythm, S1, S2 normal, no murmur, click, rub or gallop  Abdomen: soft, non-tender; bowel sounds normal; no masses, no organomegaly  Extremities: no cyanosis or edema  Pulses: 2+ and symmetric  Skin: multiple  papular lesions on BL hands, nontender, nonerythematous. Plaque/eczema like rash, dark brown with white streaks, on BL thighs and L lateral thigh, nontender, nonerythemaous.  Neurologic: CN II-XII grossly intact. Moves all 4 extremities spontaneously.  Lymph nodes: Inguinal adenopathy: bilaterally, < 1 cm  Lab Results: Basic Metabolic Panel:  Recent Labs  96/04/54 0620 05/16/12 0600  NA 133* 131*  K 3.5 4.1  CL 101 102  CO2 24 23  GLUCOSE 109* 103*  BUN 11 6  CREATININE 0.72 0.97  CALCIUM 7.5* 7.8*   Liver Function Tests:  Recent Labs  05/15/12 0620 05/16/12 0600  AST 85* 86*  ALT 59* 60*  ALKPHOS 116 122*  BILITOT 0.5 0.5  PROT 6.7 6.8  ALBUMIN 2.0* 2.2*   No results found for this basename: LIPASE, AMYLASE,  in the last 72 hours No results found for this basename: AMMONIA,  in the last 72 hours CBC:  Recent Labs  05/15/12 0508 05/16/12 0600  WBC 1.8* 1.9*  NEUTROABS 1.2*  --   HGB 9.9* 9.4*  HCT 29.9* 28.2*  MCV 77.1* 77.9*  PLT 53* 47*   Cardiac Enzymes: No results found for this basename: CKTOTAL, CKMB, CKMBINDEX, TROPONINI,  in the last 72 hours BNP: No results found for this basename: PROBNP,  in the last 72 hours D-Dimer: No results found for this basename: DDIMER,  in the last 72 hours CBG: No results found for this basename: GLUCAP,  in the last 72 hours Hemoglobin A1C: No results found for this basename: HGBA1C,  in the last 72 hours Fasting Lipid Panel: No results found for this basename: CHOL, HDL, LDLCALC, TRIG, CHOLHDL, LDLDIRECT,  in the last 72 hours Thyroid Function Tests: No results found for this basename: TSH, T4TOTAL, FREET4, T3FREE, THYROIDAB,  in the last 72 hours Anemia Panel: No results found for this basename: VITAMINB12, FOLATE, FERRITIN, TIBC, IRON, RETICCTPCT,  in the last 72 hours Coagulation: No results found for this basename: LABPROT, INR,  in the last 72 hours Urine Drug Screen: Drugs of Abuse     Component Value  Date/Time   LABOPIA NONE DETECTED 05/15/2012 1054   COCAINSCRNUR NONE DETECTED 05/15/2012 1054   LABBENZ NONE DETECTED 05/15/2012 1054   AMPHETMU NONE DETECTED 05/15/2012 1054   THCU NONE DETECTED 05/15/2012 1054   LABBARB NONE DETECTED 05/15/2012 1054    Alcohol Level: No results found for this basename: ETH,  in the last 72 hours Urinalysis:  Recent Labs  05/15/12 0640  COLORURINE AMBER*  LABSPEC 1.025  PHURINE 7.5  GLUCOSEU NEGATIVE  HGBUR NEGATIVE  BILIRUBINUR NEGATIVE  KETONESUR NEGATIVE  PROTEINUR NEGATIVE  UROBILINOGEN 1.0  NITRITE NEGATIVE  LEUKOCYTESUR NEGATIVE   Misc. Labs: Cryptococcal antigen negative RPR NR   Micro Results: Recent Results (from the past 240 hour(s))  URINE CULTURE     Status: None   Collection Time    05/12/12  8:48 AM      Result Value Range Status   Specimen Description URINE, RANDOM   Final   Special Requests NONE   Final   Culture  Setup Time 05/12/2012 19:58   Final   Colony Count NO GROWTH   Final   Culture NO GROWTH   Final   Report Status 05/13/2012 FINAL   Final  CULTURE, BLOOD (ROUTINE X 2)     Status: None   Collection Time    05/12/12  9:23 AM      Result Value Range Status   Specimen Description BLOOD RIGHT HAND   Final   Special Requests BOTTLES DRAWN AEROBIC ONLY 8CC   Final   Culture  Setup Time 05/12/2012 18:46   Final   Culture     Final   Value:        BLOOD CULTURE RECEIVED NO GROWTH TO DATE CULTURE WILL BE HELD FOR 5 DAYS BEFORE ISSUING A FINAL NEGATIVE REPORT   Report Status PENDING   Incomplete  CULTURE, BLOOD (ROUTINE X 2)     Status: None   Collection Time    05/12/12  9:25 AM      Result Value Range Status   Specimen Description BLOOD RIGHT ARM   Final   Special Requests BOTTLES DRAWN AEROBIC ONLY 10CC   Final   Culture  Setup Time 05/12/2012 18:46   Final   Culture     Final   Value:        BLOOD CULTURE RECEIVED NO GROWTH TO DATE CULTURE WILL BE HELD FOR 5 DAYS BEFORE ISSUING A FINAL NEGATIVE REPORT    Report Status PENDING   Incomplete  AFB CULTURE, BLOOD     Status: None   Collection Time    05/12/12  2:03 PM      Result Value Range Status   Specimen Description BLOOD RIGHT ARM   Final   Special Requests BOTTLES DRAWN AEROBIC ONLY 5CC  Final   Culture     Final   Value: CULTURE WILL BE EXAMINED FOR 6 WEEKS BEFORE ISSUING A FINAL REPORT   Report Status PENDING   Incomplete  MRSA PCR SCREENING     Status: None   Collection Time    05/15/12 10:40 AM      Result Value Range Status   MRSA by PCR NEGATIVE  NEGATIVE Final   Comment:            The GeneXpert MRSA Assay (FDA     approved for NASAL specimens     only), is one component of a     comprehensive MRSA colonization     surveillance program. It is not     intended to diagnose MRSA     infection nor to guide or     monitor treatment for     MRSA infections.    Studies/Results: Dg Chest 2 View  05/15/2012   *RADIOLOGY REPORT*  Clinical Data: Fever, chills.  CHEST - 2 VIEW  Comparison: 05/12/2012  Findings: Lungs are predominately clear. No pleural effusion or pneumothorax. The cardiomediastinal contours are within normal limits. The visualized bones and soft tissues are without significant appreciable abnormality.  IMPRESSION: No radiographic evidence of acute cardiopulmonary process.   Original Report Authenticated By: Jearld Lesch, M.D.    Medications: I have reviewed the patient's current medications. Scheduled Meds: . sodium chloride  1,000 mL Intravenous Once  . azithromycin  1,200 mg Oral Weekly  . sulfamethoxazole-trimethoprim  15 mg/kg/day Intravenous Q6H   Continuous Infusions: . sodium chloride 1,000 mL (05/15/12 0649)   PRN Meds:.alum & mag hydroxide-simeth, ondansetron (ZOFRAN) IV, ondansetron   Assessment/Plan: 32 yo M with AIDS and fever of unknown origin.  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  Lymphoma is also a possibility.  Crypto antigen negative.  -RMS fever, histoplasma antigen, hepititis panel, CMV PCR, ehrlichia panel, GC/chlam, HIV genotype, quantiferon gold pending. -Infectious disease following.  Appreciate recs.  -S/p IV bactrim 374.4 mg, PO bactrim 800 mg, zosyn 4.5 mg, vanc 1 g, and PO azithromycin 1.2 g in ED.  -NPO for bone marrow bx with IR today.  Initiate MAI tx after bx.  See ID note for details. -Urine culture from 5/11 negative.  Urine culture from 5/14 pending.  -Blood cultures from 5/11 and 5/14 x 2 pending. AFB, fungus cultures pending. -CBC in AM.   AIDS -CD4 <10 -PCP prophylaxis with bactrim 160 mg daily. -Initiate ART after initiating MAI tx.   Pancytopenia -WBC trending up to 1.9 from 1.8. -H/H low but stable. -Platelets low but stable.   Hyponatremia  -Na trending down to 131 from 133.  Asymptomatic. Likely 2/2 NS @ 125 cc/hr overnight. Medlock today. -Encourage PO intake. -BMP in AM.   Hypoalbuminemia  -Likely 2/2 chronic disease.  -Corrected Ca = 9.2 -Regular diet. Encourage PO intake.   Prophylaxis: SCDs   Access: R and L forearm PIVs (NS 05/15/12)   Code: full   Dispo: floor status  -Anticipated date of discharge unknown.    This is a Psychologist, occupational Note.  The care of the patient was discussed with Dr. Dorma Russell and the assessment and plan formulated with their assistance.  Please see their attached  note for official documentation of the daily encounter.  Harland Dingwall [facility] 05/16/2012, 8:44 AM

## 2012-05-17 ENCOUNTER — Inpatient Hospital Stay (HOSPITAL_COMMUNITY): Payer: MEDICAID

## 2012-05-17 DIAGNOSIS — D61818 Other pancytopenia: Secondary | ICD-10-CM

## 2012-05-17 LAB — QUANTIFERON TB GOLD ASSAY (BLOOD)
Mitogen value: 0.72 IU/mL
Quantiferon Nil Value: 0.2 IU/mL
TB Ag value: 0.21 IU/mL
TB Antigen Minus Nil Value: 0.01 IU/mL

## 2012-05-17 LAB — COMPREHENSIVE METABOLIC PANEL
ALT: 67 U/L — ABNORMAL HIGH (ref 0–53)
AST: 99 U/L — ABNORMAL HIGH (ref 0–37)
CO2: 24 mEq/L (ref 19–32)
Calcium: 8.2 mg/dL — ABNORMAL LOW (ref 8.4–10.5)
Potassium: 4.1 mEq/L (ref 3.5–5.1)
Sodium: 129 mEq/L — ABNORMAL LOW (ref 135–145)
Total Protein: 7.6 g/dL (ref 6.0–8.3)

## 2012-05-17 LAB — EHRLICHIA ANTIBODY PANEL
E chaffeensis (HGE) Ab, IgG: NEGATIVE
E chaffeensis (HGE) Ab, IgM: NEGATIVE

## 2012-05-17 LAB — CBC
MCH: 25.9 pg — ABNORMAL LOW (ref 26.0–34.0)
MCHC: 33.8 g/dL (ref 30.0–36.0)
Platelets: 52 10*3/uL — ABNORMAL LOW (ref 150–400)
RBC: 3.94 MIL/uL — ABNORMAL LOW (ref 4.22–5.81)

## 2012-05-17 MED ORDER — LORAZEPAM 2 MG/ML IJ SOLN
INTRAMUSCULAR | Status: AC | PRN
  Administered 2012-05-17: 1 mg via INTRAVENOUS

## 2012-05-17 MED ORDER — RIFABUTIN 150 MG PO CAPS
300.0000 mg | ORAL_CAPSULE | Freq: Every day | ORAL | Status: DC
  Administered 2012-05-17: 300 mg via ORAL
  Filled 2012-05-17 (×2): qty 2

## 2012-05-17 MED ORDER — RIFABUTIN 150 MG PO CAPS: 300.0000 mg | ORAL_CAPSULE | Freq: Every day | ORAL | Status: DC

## 2012-05-17 MED ORDER — AZITHROMYCIN 500 MG PO TABS: 500.0000 mg | ORAL_TABLET | Freq: Every day | ORAL | Status: DC

## 2012-05-17 MED ORDER — FENTANYL CITRATE 0.05 MG/ML IJ SOLN
INTRAMUSCULAR | Status: AC | PRN
  Administered 2012-05-17 (×2): 50 ug via INTRAVENOUS

## 2012-05-17 MED ORDER — MIDAZOLAM HCL 2 MG/2ML IJ SOLN
INTRAMUSCULAR | Status: AC | PRN
  Administered 2012-05-17: 1 mg via INTRAVENOUS

## 2012-05-17 MED ORDER — ETHAMBUTOL HCL 400 MG PO TABS: 1200.0000 mg | ORAL_TABLET | Freq: Every day | ORAL | Status: DC

## 2012-05-17 MED ORDER — AZITHROMYCIN 500 MG PO TABS
500.0000 mg | ORAL_TABLET | Freq: Every day | ORAL | Status: DC
  Administered 2012-05-17: 500 mg via ORAL
  Filled 2012-05-17: qty 1

## 2012-05-17 MED ORDER — ETHAMBUTOL HCL 400 MG PO TABS
15.0000 mg/kg | ORAL_TABLET | Freq: Every day | ORAL | Status: DC
  Administered 2012-05-17: 1200 mg via ORAL
  Filled 2012-05-17: qty 3

## 2012-05-17 MED ORDER — SULFAMETHOXAZOLE-TMP DS 800-160 MG PO TABS: 1.0000 | ORAL_TABLET | Freq: Every day | ORAL | Status: DC

## 2012-05-17 NOTE — Progress Notes (Signed)
    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 Bactrim prophylaxis  Subjective: No complaints, eating, no diarrhea, bone marrow this am  Objective: Temp:  [99.2 F (37.3 C)-100.6 F (38.1 C)] 99.2 F (37.3 C) (05/16 1220) Pulse Rate:  [68-105] 68 (05/16 1220) Resp:  [18-33] 18 (05/16 1220) BP: (95-110)/(48-60) 104/57 mmHg (05/16 1220) SpO2:  [98 %-100 %] 99 % (05/16 0950)  General: Awake, alert, nad Skin: + warts on hands Lungs: CTA B Cor: RRr without m/r/g Abdomen: soft, nt, nd, +bs   Lab Results Lab Results  Component Value Date   WBC 1.6* 05/17/2012   HGB 10.2* 05/17/2012   HCT 30.2* 05/17/2012   MCV 76.6* 05/17/2012   PLT 52* 05/17/2012    Lab Results  Component Value Date   CREATININE 1.02 05/17/2012   BUN 11 05/17/2012   NA 129* 05/17/2012   K 4.1 05/17/2012   CL 99 05/17/2012   CO2 24 05/17/2012    Lab Results  Component Value Date   ALT 67* 05/17/2012   AST 99* 05/17/2012   ALKPHOS 134* 05/17/2012   BILITOT 0.6 05/17/2012      Microbiology:   Studies/Results: No results found.  Assessment/Plan: 1) Fever - seems to be improved.  Unknown etiology and work up underway.   -continue empiric MAC treatment.   -hold ARVs for now for concern of MAC and IRIS.    2)HIV/AIDS - CD4 < 10, viral load up.  Genotype pending.   -holding on ARVs pending work up for MAC.   -Bactrim prophylaxis -he will need a 30 days supply of his current medications at discharge until he is established with drug assistance program -if histo Ag and fungal antibodies negative for acute infection, we could follow up with results in clinic if stable for discharge from an IM standpoint   Staci Righter, MD [facility] [phone] pager   05/17/2012, 12:26 PM

## 2012-05-17 NOTE — Progress Notes (Addendum)
Subjective: No complaints today. Just returned form his bone marrow biopsy, denies pain. Denies SOB, difficulty breathing, or abdominal pain.  Objective: Vital signs in last 24 hours: Filed Vitals:   05/17/12 0945 05/17/12 0950 05/17/12 1040 05/17/12 1100  BP: 101/53 100/55 105/57 99/54  Pulse: 98 97 98 94  Temp:      TempSrc:      Resp: 23 26    Height:      Weight:      SpO2: 99% 99%     Weight change:   Intake/Output Summary (Last 24 hours) at 05/17/12 1118 Last data filed at 05/17/12 0900  Gross per 24 hour  Intake    125 ml  Output   1400 ml  Net  -1275 ml   Vitals reviewed. General: Resting in bed, NAD HEENT: PERRL, EOMI Cardiac: RRR, no rubs, murmurs or gallops Pulm: Clear to auscultation bilaterally, no wheezes, rales, or rhonchi Abd: Soft, nontender, nondistended, BS present Ext: Warts on bilateral hands, psoriatic-like lesions on bilateral thighs and left lateral calf. Warm and well perfused, no pedal edema. BM Bx site with band-aid, c/d/i. Neuro: Alert and oriented X3, nonfocal  Lab Results: Basic Metabolic Panel:  Recent Labs Lab 05/16/12 0600 05/17/12 1022  NA 131* 129*  K 4.1 4.1  CL 102 99  CO2 23 24  GLUCOSE 103* 95  BUN 6 11  CREATININE 0.97 1.02  CALCIUM 7.8* 8.2*   Liver Function Tests:  Recent Labs Lab 05/16/12 0600 05/17/12 1022  AST 86* 99*  ALT 60* 67*  ALKPHOS 122* 134*  BILITOT 0.5 0.6  PROT 6.8 7.6  ALBUMIN 2.2* 2.5*   CBC:  Recent Labs Lab 05/12/12 0748 05/15/12 0508 05/16/12 0600 05/17/12 1022  WBC 1.8* 1.8* 1.9* 1.6*  NEUTROABS 1.4* 1.2*  --   --   HGB 9.8* 9.9* 9.4* 10.2*  HCT 28.8* 29.9* 28.2* 30.2*  MCV 77.6* 77.1* 77.9* 76.6*  PLT 55* 53* 47* 52*   Urine Drug Screen: Drugs of Abuse     Component Value Date/Time   LABOPIA NONE DETECTED 05/15/2012 1054   COCAINSCRNUR NONE DETECTED 05/15/2012 1054   LABBENZ NONE DETECTED 05/15/2012 1054   AMPHETMU NONE DETECTED 05/15/2012 1054   THCU NONE DETECTED  05/15/2012 1054   LABBARB NONE DETECTED 05/15/2012 1054    AUrinalysis:  Recent Labs Lab 05/12/12 0848 05/15/12 0640  COLORURINE YELLOW AMBER*  LABSPEC 1.021 1.025  PHURINE 6.5 7.5  GLUCOSEU NEGATIVE NEGATIVE  HGBUR NEGATIVE NEGATIVE  BILIRUBINUR NEGATIVE NEGATIVE  KETONESUR NEGATIVE NEGATIVE  PROTEINUR NEGATIVE NEGATIVE  UROBILINOGEN 1.0 1.0  NITRITE NEGATIVE NEGATIVE  LEUKOCYTESUR NEGATIVE NEGATIVE   Misc. Labs: 05/15/12: Hepatitis panel: Negative RSV panel: Negative GC/Chlamydia: Negative  05/16/12: LDH: 398 Crypto Ag: Negative RPR: Negative RMSF: Negative CMV: penidng Histo: pending AFB blood cx: pending Quantiferon gold assay: pending HIV genotype: pending  05/17/12: Bone Marrow Biopsy: Results pending  Micro Results: Recent Results (from the past 240 hour(s))  URINE CULTURE     Status: None   Collection Time    05/12/12  8:48 AM      Result Value Range Status   Specimen Description URINE, RANDOM   Final   Special Requests NONE   Final   Culture  Setup Time 05/12/2012 19:58   Final   Colony Count NO GROWTH   Final   Culture NO GROWTH   Final   Report Status 05/13/2012 FINAL   Final  CULTURE, BLOOD (ROUTINE X 2)  Status: None   Collection Time    05/12/12  9:23 AM      Result Value Range Status   Specimen Description BLOOD RIGHT HAND   Final   Special Requests BOTTLES DRAWN AEROBIC ONLY 8CC   Final   Culture  Setup Time 05/12/2012 18:46   Final   Culture     Final   Value:        BLOOD CULTURE RECEIVED NO GROWTH TO DATE CULTURE WILL BE HELD FOR 5 DAYS BEFORE ISSUING A FINAL NEGATIVE REPORT   Report Status PENDING   Incomplete  CULTURE, BLOOD (ROUTINE X 2)     Status: None   Collection Time    05/12/12  9:25 AM      Result Value Range Status   Specimen Description BLOOD RIGHT ARM   Final   Special Requests BOTTLES DRAWN AEROBIC ONLY 10CC   Final   Culture  Setup Time 05/12/2012 18:46   Final   Culture     Final   Value:        BLOOD CULTURE  RECEIVED NO GROWTH TO DATE CULTURE WILL BE HELD FOR 5 DAYS BEFORE ISSUING A FINAL NEGATIVE REPORT   Report Status PENDING   Incomplete  AFB CULTURE, BLOOD     Status: None   Collection Time    05/12/12  2:03 PM      Result Value Range Status   Specimen Description BLOOD RIGHT ARM   Final   Special Requests BOTTLES DRAWN AEROBIC ONLY 5CC   Final   Culture     Final   Value: CULTURE WILL BE EXAMINED FOR 6 WEEKS BEFORE ISSUING A FINAL REPORT   Report Status PENDING   Incomplete  CULTURE, BLOOD (ROUTINE X 2)     Status: None   Collection Time    05/15/12  5:35 AM      Result Value Range Status   Specimen Description BLOOD LEFT ARM   Final   Special Requests BOTTLES DRAWN AEROBIC ONLY 10CC   Final   Culture  Setup Time 05/15/2012 09:09   Final   Culture     Final   Value:        BLOOD CULTURE RECEIVED NO GROWTH TO DATE CULTURE WILL BE HELD FOR 5 DAYS BEFORE ISSUING A FINAL NEGATIVE REPORT   Report Status PENDING   Incomplete  CULTURE, BLOOD (ROUTINE X 2)     Status: None   Collection Time    05/15/12  5:40 AM      Result Value Range Status   Specimen Description BLOOD LEFT HAND   Final   Special Requests BOTTLES DRAWN AEROBIC ONLY 10CC   Final   Culture  Setup Time 05/15/2012 09:09   Final   Culture     Final   Value:        BLOOD CULTURE RECEIVED NO GROWTH TO DATE CULTURE WILL BE HELD FOR 5 DAYS BEFORE ISSUING A FINAL NEGATIVE REPORT   Report Status PENDING   Incomplete  FUNGUS CULTURE, BLOOD     Status: None   Collection Time    05/15/12  5:40 AM      Result Value Range Status   Specimen Description BLOOD LEFT HAND   Final   Special Requests BOTTLES DRAWN AEROBIC ONLY 10CC   Final   Culture NO FUNGUS ISOLATED;CULTURE IN PROGRESS FOR 7 DAYS   Final   Report Status PENDING   Incomplete  URINE CULTURE     Status:  None   Collection Time    05/15/12  6:40 AM      Result Value Range Status   Specimen Description URINE, RANDOM   Final   Special Requests NONE   Final   Culture   Setup Time 05/15/2012 12:50   Final   Colony Count NO GROWTH   Final   Culture NO GROWTH   Final   Report Status 05/16/2012 FINAL   Final  GC/CHLAMYDIA PROBE AMP     Status: None   Collection Time    05/15/12  6:40 AM      Result Value Range Status   CT Probe RNA NEGATIVE  NEGATIVE Final   GC Probe RNA NEGATIVE  NEGATIVE Final   Comment: (NOTE)                                                                                              Normal Reference Range: Negative          Assay performed using the Gen-Probe APTIMA COMBO2 (R) Assay.     Acceptable specimen types for this assay include APTIMA Swabs (Unisex,     endocervical, urethral, or vaginal), first void urine, and ThinPrep     liquid based cytology samples.  RESPIRATORY VIRUS PANEL     Status: None   Collection Time    05/15/12 10:40 AM      Result Value Range Status   Source - RVPAN NASAL SWAB   Corrected   Comment: CORRECTED ON 05/15 AT 2024: PREVIOUSLY REPORTED AS NASAL SWAB   Respiratory Syncytial Virus A NOT DETECTED   Final   Respiratory Syncytial Virus B NOT DETECTED   Final   Influenza A NOT DETECTED   Final   Influenza B NOT DETECTED   Final   Parainfluenza 1 NOT DETECTED   Final   Parainfluenza 2 NOT DETECTED   Final   Parainfluenza 3 NOT DETECTED   Final   Metapneumovirus NOT DETECTED   Final   Rhinovirus NOT DETECTED   Final   Adenovirus NOT DETECTED   Final   Influenza A H1 NOT DETECTED   Final   Influenza A H3 NOT DETECTED   Final   Comment: (NOTE)           Normal Reference Range for each Analyte: NOT DETECTED     Testing performed using the [employer] xTAG Respiratory Viral Panel test     kit.     This test was developed and its performance characteristics determined     by [employer]. It has not been cleared or approved by the [place]     Food and Drug Administration. This test is used for clinical purposes.     It should not be regarded as investigational or for research. This     laboratory is  certified under the Clinical Laboratory Improvement     Amendments of 1988 (CLIA) as qualified to perform high complexity     clinical laboratory testing.  MRSA PCR SCREENING     Status: None   Collection Time    05/15/12 10:40 AM  Result Value Range Status   MRSA by PCR NEGATIVE  NEGATIVE Final   Comment:            The GeneXpert MRSA Assay (FDA     approved for NASAL specimens     only), is one component of a     comprehensive MRSA colonization     surveillance program. It is not     intended to diagnose MRSA     infection nor to guide or     monitor treatment for     MRSA infections.   Studies/Results: No results found. Medications: I have reviewed the patient's current medications. Scheduled Meds: . 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 He was admitted for <12hrs on 5/11 with intermittent fevers and pancytopenia a CD4 and VL were checked and CD4 <10 and HIV VL 62,399, Ucx negative and Bcx NTD. He returns with continued fevers, with etiology possible again due to disseminated histoplasmosis vs disseminated TB or MAC. Lymphoma is also a possibility. Crypto antigen negative, RPR negative. Checking blood and urine cultures, RMS fever Abs, histoplasma antigen, hepititis panel, CMV PCR, ehrlichia panel, GC/chlam, HIV genotype, Bcx for AFB and fungal cultures, and quantiferon gold which are all pending. Blood and urine cultures pending. He was started on IV bactrim 374.4 mg, zosyn 4.5 mg x1, vanc 1 g x1, and PO azithromycin 1.2 g in ED, but with a normal CXR and no respiratory sx, he likely does  not have PCP, so changing Bactrim to po prophylactic dose. Dr. Ninetta Lights with ID is on board and recommended a bone marrow biopsy for further evaluation, which was done today. Initiating MAI tx. Pt with temp to 100.6 this am, none since. - F/u with Infectious Disease, appreciate recs.  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 Previously seen in the ID clinic, with this last visit 7/12 with CD4 310 and undetectable VL on ART. No ART in over 1 year b/c he moved away and was lost to follow up. CD4 <10 and HIV VL 62,399 on 5/11. He is on PCP prophylaxis with bactrim 160 mg daily. BM bx doen today, and started MAI treatment. Will start ART after 1 week to decrease the chance of IRIS. - MAI therapy - Initiate ART in 1 week  Skin rash: When diagnosed with AIDS in '08, with rash on his legs and dx with disseminated histoplasmosis. Presents this time with skin rash on bilateral thighs and left lateral calf x 3 months prior to admission, which is similar to previous rash at diagnosis. - Checking for histoplasmosis   Pancytopenia:  WBC 1.8, platelets 53, H&H 9.9/29.9. Today WBC 1.8. H/H and platelets imporved today.  - AM CBC  Hyponatremia:  Na trending down to 129 from 133. Asymptomatic. Possibly dilutional 2/2 NS @ 125 cc/hr overnight. IV heplocked and pt on regular diet but was NPO overnight  for his bone marrow biopsy done this morning. Suspect with diet his Na will improve. -BMP  in AM.   Transaminitis: Since admission AP, AST, and ALT trending upward. His hepatitis panel is negative. Possibly 2/2 infectious process such as mycobacterium avium complex disease. - AM CMP  Hypoalbuminemia:  Likely 2/2 chronic disease. Corrected Ca = 9.2. Regular diet. Encouraging PO intake.   Prophylaxis: SCDs   Dispo:  Disposition is deferred at this time, awaiting improvement of current medical problems.  Anticipated discharge in approximately 2-3 day(s).   The patient does not have a current PCP (No PCP Per Patient), therefore will be  followed-up after discharge by ID clinic.   The patient does not have transportation limitations that hinder transportation to clinic appointments.  .Services Needed at time of discharge: Y = Yes, Blank = No PT:   OT:   RN:   Equipment:   Other:     LOS: 2 days   Genelle Gather 05/17/2012, 11:18 AM

## 2012-05-17 NOTE — Progress Notes (Signed)
Patient discharge teaching given through live interpreter, including activity, diet, follow-up appoints, and medications. Patient verbalized understanding of all discharge instructions. IV access was d/c'd. Vitals are stable. Skin is intact except as charted in most recent assessments. Pt to be escorted out by NT, to be driven home by family.

## 2012-05-17 NOTE — Discharge Summary (Signed)
Internal Medicine Teaching [facility] Discharge Note  Name: David Knapp MRN: [id number] DOB: 1980-11-29 32 y.o.  Date of Admission: 05/15/2012  4:52 AM Date of Discharge: 05/17/2012 Attending Physician: Lars Mage, MD  Discharge Diagnosis: Principal Problem:   AIDS Active Problems:   HIV DISEASE   WARTS, BOTH HANDS   Pancytopenia   Fever   SIRS (systemic inflammatory response syndrome)   Discharge Medications:   Medication List    Medication List    TAKE these medications       azithromycin 500 MG tablet  Commonly known as:  ZITHROMAX  Take 1 tablet (500 mg total) by mouth daily.     ethambutol 400 MG tablet  Commonly known as:  MYAMBUTOL  Take 3 tablets (1,200 mg total) by mouth daily.     rifabutin 150 MG capsule  Commonly known as:  MYCOBUTIN  Take 2 capsules (300 mg total) by mouth daily.     sulfamethoxazole-trimethoprim 800-160 MG per tablet  Commonly known as:  BACTRIM DS  Take 1 tablet by mouth daily.         As of 05/17/2012  2:02 PM                   Disposition and follow-up:   David Knapp was discharged from [facility] in Stable condition.  At the hospital follow up visit please address  - His medication compliance with MAI therapy, he was provided a 30 day supply prior to discharge - Please f/u on the labs obtained in the hospital - Please check a BMP to monitor his sodium level - Please check a CBC to monitor his pancytopenia - He did begin to have a transaminitis this admission, and his LFTs should be repeated at follow up.  Follow-up Appointments:       Future Appointments Provider Department Dept Phone   05/20/2012 4:15 PM Randall Hiss, MD [facility] for Infectious Disease [phone]      Consultations:  Infectious Disease  Procedures Performed:  Dg Chest 2 View  05/15/2012   *RADIOLOGY REPORT*  Clinical Data: Fever, chills.  CHEST - 2 VIEW  Comparison: 05/12/2012  Findings:  Lungs are predominately clear. No pleural effusion or pneumothorax. The cardiomediastinal contours are within normal limits. The visualized bones and soft tissues are without significant appreciable abnormality.  IMPRESSION: No radiographic evidence of acute cardiopulmonary process.   Original Report Authenticated By: Jearld Lesch, M.D.   Dg Chest 2 View  05/12/2012   *RADIOLOGY REPORT*  Clinical Data: Fever and cough.  Right-sided chest pain.  CHEST - 2 VIEW  Comparison: 07/09/2006  Findings: The lungs are clear and show no evidence of infiltrate, edema, nodule or pleural fluid.  Cardiac and mediastinal contours within normal limits.  The bony thorax is unremarkable.  IMPRESSION: Normal chest.  No active disease identified.   Original Report Authenticated By: Irish Lack, M.D.   Ct Head Wo Contrast  05/12/2012   *RADIOLOGY REPORT*  Clinical Data: Headache, HIV and pancytopenia.  CT HEAD WITHOUT CONTRAST  Technique:  Contiguous axial images were obtained from the base of the skull through the vertex without contrast.  Comparison: 05/03/2005  Findings: The brain demonstrates no evidence of hemorrhage, infarction, edema, mass effect, extra-axial fluid collection, hydrocephalus or mass lesion.  The skull is unremarkable.  IMPRESSION: Normal head CT.   Original Report Authenticated By: Irish Lack, M.D.   Ct Biopsy  05/17/2012   *RADIOLOGY REPORT*  Clinical  Data/Indication: PANCYTOPENIA  CT-GUIDED BONE MARROW ASPIRATE, CORE, AND CULTURE  Sedation: Versed 2.0 mg, Fentanyl 100 mcg.  Total Moderate Sedation Time: 15 minutes.  Procedure: The procedure, risks, benefits, and alternatives were explained to the patient. Questions regarding the procedure were encouraged and answered. The patient understands and consents to the procedure.  The back was prepped with betadine in a sterile fashion, and a sterile drape was applied covering the operative field. A sterile mask and sterile gloves were used for the  procedure.  Under CT guidance, 11 gauge needle was inserted into the right iliac bone via posterior approach.  Aspirates were obtained.  One aspirate was obtained for culture.  A core was obtained.  Final imaging was performed.  Patient tolerated the procedure well without complication.  Vital sign monitoring by nursing staff during the procedure will continue as patient is in the special procedures unit for post procedure observation.  Findings: The images document guide needle placement within the right iliac bone via posterior approach.  Post biopsy images demonstrate no hemorrhage.  IMPRESSION: Successful CT-guided bone marrow aspirate and core as well as culture.   Original Report Authenticated By: Jolaine Click, M.D.   Admission  History of Present Illness: 32 year old gentleman with past medical history significant for AIDS with CD4 count less than 10, medication noncompliance presents to the ED with fevers and chills for 10 days.  He was previously followed in the [facility] until about 2 years ago when he was lost to follow-up. He subsequently stopped taking his antiretroviral therapy. He returns to the ED having fevers and chills for 7- 10 days. He reports having some weight loss of around 20 pounds for last 4 months He also reports noticing some hyperpigmented rash on his skin for last 4 months. Denies any itching or pain associated with it. He was also noted to have warts on both his hands that he states has been present for more than a year. He denies any systemic complaints including chest pain, cough, shortness of breath, abdominal pain, nausea, alteration in bladder or bowel habits.  Of note , he was admitted to the teaching service for one day through 05/12/2012 to 05/13/2012 and was discharged home with a followup in [facility] but he came back to the ED before he could be seen in the clinic.   Review of Systems:  Constitutional: negative for chills, fatigue, malaise and night sweats. Positive for  fever, weight loss( ~20 lbs in 4 months)  HEENT: negative for earaches, epistaxis, hoarseness, nasal congestion, sore throat and voice change  Respiratory: negative for cough, dyspnea on exertion, sputum, stridor and wheezing  Cardiovascular: negative for chest pain, irregular heart beat, lower extremity edema, orthopnea, palpitations and paroxysmal nocturnal dyspnea  Genitourinary:negative for dysuria, frequency, hematuria and nocturia  Musculoskeletal:negative for arthralgias, myalgias and stiff joints  Neurological: negative for dizziness, headaches, memory problems, paresthesia, seizures, speech problems and weakness  Skin: Positive for rash   Physical Exam:  Blood pressure 92/48, pulse 90, temperature 98.3 F (36.8 C), temperature source Oral, resp. rate 18, height 5' 6.14" (1.68 m), weight 220 lb 0.3 oz (99.8 kg), SpO2 99.00%.  BP 92/48  Pulse 90  Temp(Src) 98.3 F (36.8 C) (Oral)  Resp 18  Ht 5' 6.14" (1.68 m)  Wt 220 lb 0.3 oz (99.8 kg)  BMI 35.36 kg/m2  SpO2 99%  General Appearance:  Alert, cooperative, no distress, appears stated age   Head:  Normocephalic, without obvious abnormality, atraumatic   Eyes:  PERRL, conjunctiva/corneas clear, EOM's intact, fundi  benign, both eyes   Ears:  Normal TM's and external ear canals, both ears   Nose:  Nares normal, septum midline, mucosa normal, no drainage or sinus tenderness   Throat:  Lips, mucosa, and tongue normal; teeth and gums normal   Neck:  Supple, symmetrical, trachea midline, no adenopathy;  thyroid: No enlargement/tenderness/nodules; no carotid  bruit or JVD   Back:  Symmetric, no curvature, ROM normal, no CVA tenderness   Lungs:  Clear to auscultation bilaterally, respirations unlabored   Chest wall:  No tenderness or deformity   Heart:  Regular rate and rhythm, S1 and S2 normal, no murmur, rub or gallop   Abdomen:  Soft, non-tender, bowel sounds active all four quadrants,  no masses, no organomegaly   Genitalia:   Normal male without lesion, discharge or tenderness   Rectal:  Normal tone, normal prostate, no masses or tenderness;  guaiac negative stool   Extremities:  Extremities normal, atraumatic, no cyanosis or edema   Pulses:  2+ and symmetric all extremities   Skin:  Hyperpigmented patches with dry scaly skin were noted on both the lower extremities, a patch on the left calf measures about 10 x10 cm, right thigh is relatively smaller  warts present on both the hands   Lymph nodes:  Cervical, supraclavicular, and axillary nodes normal   Neurologic:  CNII-XII intact. Normal strength, sensation and reflexes  throughout     Hospital Course by problem list: 32 year old gentleman with past medical history significant for AIDS with CD4 count less than 10, medication noncompliance presents to the ED with fevers and chills for 10 days.   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 He was admitted for <12hrs on 5/11 with intermittent fevers and pancytopenia a CD4 and VL were checked and CD4 <10 and HIV VL 62,399, Ucx negative and Bcx NTD. He returns with continued fevers, with etiology possible again due to disseminated histoplasmosis vs disseminated TB or MAC. Lymphoma is also a possibility. Crypto antigen negative, RPR negative. Checking blood and urine cultures, RMS fever Abs, histoplasma antigen, hepititis panel, CMV PCR, ehrlichia panel, GC/chlam, HIV genotype, Bcx for AFB and fungal cultures, and quantiferon gold which are all pending. Blood and urine cultures pending. He was started on IV bactrim 374.4 mg, zosyn 4.5 mg x1, vanc 1 g x1, and PO azithromycin 1.2 g in ED, but with a normal CXR and no respiratory sx, he likely does not have PCP, so changing Bactrim to po prophylactic dose. Dr. Ninetta Lights with ID has been on board and recommended a bone marrow biopsy for further evaluation, which was done today. Post biopsy, we began MAI therapy. He did have a temp to 100.6 early  this morning but none since; no tylenol was given.  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 Previously seen in the ID clinic, with this last visit 7/12 with CD4 310 and undetectable VL on ART. No ART in over 1 year b/c he moved away and was lost to follow up. CD4 <10 and HIV VL 62,399 on 5/11. He is on PCP prophylaxis with bactrim 160 mg daily. BM bx doen today, and started MAI treatment. ART to be initiated after 1 week to decrease the  chance of IRIS.  - MAI therapy  - Initiate ART in 1 week   Skin rash:  When diagnosed with AIDS in '08, with rash on his legs and dx with disseminated histoplasmosis. Presents this time with skin rash on bilateral thighs and left lateral calf x 3 months prior to admission, which is similar to previous rash at diagnosis.  - Checking for histoplasmosis   Pancytopenia:  WBC 1.8, platelets 53, H&H 9.9/29.9. Today WBC 1.8. H/H and platelets imporved today.  - CBC at hospital follow up  Hyponatremia:  Na trending down to 129 from 133. Asymptomatic. Possibly dilutional 2/2 NS @ 125 cc/hr overnight. IV heplocked and pt on regular diet but was NPO overnight for his bone marrow biopsy done this morning. Suspect with diet his Na will improve.  - CMP at hospital f/u.   Transaminitis:  Since admission AP, AST, and ALT trending upward. His hepatitis panel is negative. Possibly 2/2 infectious process such as mycobacterium avium complex disease. - LFTs at outpatient f/u  Hypoalbuminemia:  Likely 2/2 chronic disease. Corrected Ca = 9.2. Regular diet. Encouraging PO intake.    Discharge Vitals:  BP 104/57  Pulse 68  Temp(Src) 99.2 F (37.3 C) (Oral)  Resp 18  Ht 5' 6.14" (1.68 m)  Wt 220 lb 0.3 oz (99.8 kg)  BMI 35.36 kg/m2  SpO2 99%  Discharge Labs:   Results for orders placed during the hospital encounter of 05/15/12 (from the past 24 hour(s))  BONE MARROW EXAM     Status: None   Collection Time    05/17/12  9:45 AM      Result Value Range   Bone Marrow Exam 306    CBC     Status: Abnormal   Collection Time    05/17/12 10:22 AM      Result Value Range   WBC 1.6 (*) 4.0 - 10.5 K/uL   RBC 3.94 (*) 4.22 - 5.81 MIL/uL   Hemoglobin 10.2 (*) 13.0 - 17.0 g/dL   HCT 62.1 (*) 30.8 - 65.7 %   MCV 76.6 (*) 78.0 - 100.0 fL   MCH 25.9 (*) 26.0 - 34.0 pg   MCHC 33.8  30.0 - 36.0 g/dL   RDW 84.6  96.2 - 95.2 %   Platelets 52 (*) 150 - 400 K/uL  COMPREHENSIVE METABOLIC PANEL     Status: Abnormal   Collection Time    05/17/12 10:22 AM      Result Value Range   Sodium 129 (*) 135 - 145 mEq/L   Potassium 4.1  3.5 - 5.1 mEq/L   Chloride 99  96 - 112 mEq/L   CO2 24  19 - 32 mEq/L   Glucose, Bld 95  70 - 99 mg/dL   BUN 11  6 - 23 mg/dL   Creatinine, Ser 8.41  0.50 - 1.35 mg/dL   Calcium 8.2 (*) 8.4 - 10.5 mg/dL   Total Protein 7.6  6.0 - 8.3 g/dL   Albumin 2.5 (*) 3.5 - 5.2 g/dL   AST 99 (*) 0 - 37 U/L   ALT 67 (*) 0 - 53 U/L   Alkaline Phosphatase 134 (*) 39 - 117 U/L   Total Bilirubin 0.6  0.3 - 1.2 mg/dL   GFR calc non Af Amer >90  >90 mL/min   GFR calc Af Amer >90  >90 mL/min    Signed: Genelle Gather 05/17/2012, 2:02 PM   Time Spent on Discharge: Services Ordered on Discharge: None Equipment  Ordered on Discharge: None

## 2012-05-17 NOTE — Progress Notes (Signed)
Resident Co-sign Daily Note: I have seen the patient and reviewed the daily progress note by Harland Dingwall, MS-3 and discussed the care of the patient with her.  See my separate note for documentation of my findings, assessment, and plans.    LOS: 2 days   David Knapp 05/17/2012, 6:03 PM

## 2012-05-17 NOTE — Procedures (Signed)
BM Bx, aspirate and Cx R iliac bone No comp

## 2012-05-17 NOTE — Progress Notes (Signed)
Internal Medicine Teaching Service Attending Note Date: 05/17/2012  Patient name: David Knapp  Medical record number: [id number]  Date of birth: 05-20-1980    This patient has beendiscussed with the house staff. Please see their note for complete details. I concur with their findings.   Harbour Nordmeyer 05/17/2012, 1:52 PM

## 2012-05-17 NOTE — Progress Notes (Signed)
Medical Student Daily Progress Note  Subjective: NAE overnight. Bone marrow biopsy completed this AM.  Denies pain and chills.     Objective: Vital signs in last 24 hours: Filed Vitals:   05/17/12 0445 05/17/12 0919 05/17/12 0940 05/17/12 0945  BP: 105/48 95/57 104/57 101/53  Pulse: 105 102 97 98  Temp: 100.6 F (38.1 C)     TempSrc: Oral     Resp: 20 24 25 23   Height:      Weight:      SpO2: 99% 99% 100% 99%   Weight change:   Intake/Output Summary (Last 24 hours) at 05/17/12 0946 Last data filed at 05/17/12 0900  Gross per 24 hour  Intake    375 ml  Output   2050 ml  Net  -1675 ml    Physical Exam: BP 100/55  Pulse 97  Temp(Src) 100.6 F (38.1 C) (Oral)  Resp 26  Ht 5' 6.14" (1.68 m)  Wt 99.8 kg (220 lb 0.3 oz)  BMI 35.36 kg/m2  SpO2 99% General appearance: alert, cooperative, appears stated age and no distress. Laying in bed.  Head: Normocephalic, without obvious abnormality, atraumatic  Eyes: EOMI. Sclera clear.  Throat: mucosa erythematous  Neck: no adenopathy, supple, symmetrical, trachea midline and thyroid not enlarged, symmetric, no tenderness/mass/nodules  Back: band-aid in lumbar area over bone marrow biopsy site, c/d/i. Lungs: clear to auscultation bilaterally  Heart: regular rate and rhythm, S1, S2 normal, no murmur, click, rub or gallop  Abdomen: soft, non-tender; bowel sounds normal; no masses, no organomegaly  Extremities: no cyanosis or edema  Pulses: 2+ and symmetric  Skin: multiple papular lesions on BL hands, nontender, nonerythematous. Plaque/eczema like rash, dark brown with white streaks, on BL thighs and L lateral thigh, nontender, nonerythemaous.  Neurologic: CN II-XII grossly intact. Moves all 4 extremities spontaneously.  Lymph nodes: Inguinal adenopathy: bilaterally, < 1 cm   Lab Results: Basic Metabolic Panel:  Recent Labs  16/10/96 0620 05/16/12 0600  NA 133* 131*  K 3.5 4.1  CL 101 102  CO2 24 23  GLUCOSE 109* 103*   BUN 11 6  CREATININE 0.72 0.97  CALCIUM 7.5* 7.8*   Liver Function Tests:  Recent Labs  05/15/12 0620 05/16/12 0600  AST 85* 86*  ALT 59* 60*  ALKPHOS 116 122*  BILITOT 0.5 0.5  PROT 6.7 6.8  ALBUMIN 2.0* 2.2*   No results found for this basename: LIPASE, AMYLASE,  in the last 72 hours No results found for this basename: AMMONIA,  in the last 72 hours CBC:  Recent Labs  05/15/12 0508 05/16/12 0600  WBC 1.8* 1.9*  NEUTROABS 1.2*  --   HGB 9.9* 9.4*  HCT 29.9* 28.2*  MCV 77.1* 77.9*  PLT 53* 47*   Cardiac Enzymes: No results found for this basename: CKTOTAL, CKMB, CKMBINDEX, TROPONINI,  in the last 72 hours BNP: No results found for this basename: PROBNP,  in the last 72 hours D-Dimer: No results found for this basename: DDIMER,  in the last 72 hours CBG: No results found for this basename: GLUCAP,  in the last 72 hours Hemoglobin A1C: No results found for this basename: HGBA1C,  in the last 72 hours Fasting Lipid Panel: No results found for this basename: CHOL, HDL, LDLCALC, TRIG, CHOLHDL, LDLDIRECT,  in the last 72 hours Thyroid Function Tests: No results found for this basename: TSH, T4TOTAL, FREET4, T3FREE, THYROIDAB,  in the last 72 hours Anemia Panel: No results found for this basename: VITAMINB12, FOLATE,  FERRITIN, TIBC, IRON, RETICCTPCT,  in the last 72 hours Coagulation: No results found for this basename: LABPROT, INR,  in the last 72 hours Urine Drug Screen: Drugs of Abuse     Component Value Date/Time   LABOPIA NONE DETECTED 05/15/2012 1054   COCAINSCRNUR NONE DETECTED 05/15/2012 1054   LABBENZ NONE DETECTED 05/15/2012 1054   AMPHETMU NONE DETECTED 05/15/2012 1054   THCU NONE DETECTED 05/15/2012 1054   LABBARB NONE DETECTED 05/15/2012 1054    Alcohol Level: No results found for this basename: ETH,  in the last 72 hours Urinalysis:  Recent Labs  05/15/12 0640  COLORURINE AMBER*  LABSPEC 1.025  PHURINE 7.5  GLUCOSEU NEGATIVE  HGBUR NEGATIVE   BILIRUBINUR NEGATIVE  KETONESUR NEGATIVE  PROTEINUR NEGATIVE  UROBILINOGEN 1.0  NITRITE NEGATIVE  LEUKOCYTESUR NEGATIVE     Micro Results: Recent Results (from the past 240 hour(s))  URINE CULTURE     Status: None   Collection Time    05/12/12  8:48 AM      Result Value Range Status   Specimen Description URINE, RANDOM   Final   Special Requests NONE   Final   Culture  Setup Time 05/12/2012 19:58   Final   Colony Count NO GROWTH   Final   Culture NO GROWTH   Final   Report Status 05/13/2012 FINAL   Final  CULTURE, BLOOD (ROUTINE X 2)     Status: None   Collection Time    05/12/12  9:23 AM      Result Value Range Status   Specimen Description BLOOD RIGHT HAND   Final   Special Requests BOTTLES DRAWN AEROBIC ONLY 8CC   Final   Culture  Setup Time 05/12/2012 18:46   Final   Culture     Final   Value:        BLOOD CULTURE RECEIVED NO GROWTH TO DATE CULTURE WILL BE HELD FOR 5 DAYS BEFORE ISSUING A FINAL NEGATIVE REPORT   Report Status PENDING   Incomplete  CULTURE, BLOOD (ROUTINE X 2)     Status: None   Collection Time    05/12/12  9:25 AM      Result Value Range Status   Specimen Description BLOOD RIGHT ARM   Final   Special Requests BOTTLES DRAWN AEROBIC ONLY 10CC   Final   Culture  Setup Time 05/12/2012 18:46   Final   Culture     Final   Value:        BLOOD CULTURE RECEIVED NO GROWTH TO DATE CULTURE WILL BE HELD FOR 5 DAYS BEFORE ISSUING A FINAL NEGATIVE REPORT   Report Status PENDING   Incomplete  AFB CULTURE, BLOOD     Status: None   Collection Time    05/12/12  2:03 PM      Result Value Range Status   Specimen Description BLOOD RIGHT ARM   Final   Special Requests BOTTLES DRAWN AEROBIC ONLY 5CC   Final   Culture     Final   Value: CULTURE WILL BE EXAMINED FOR 6 WEEKS BEFORE ISSUING A FINAL REPORT   Report Status PENDING   Incomplete  CULTURE, BLOOD (ROUTINE X 2)     Status: None   Collection Time    05/15/12  5:35 AM      Result Value Range Status   Specimen  Description BLOOD LEFT ARM   Final   Special Requests BOTTLES DRAWN AEROBIC ONLY 10CC   Final   Culture  Setup  Time 05/15/2012 09:09   Final   Culture     Final   Value:        BLOOD CULTURE RECEIVED NO GROWTH TO DATE CULTURE WILL BE HELD FOR 5 DAYS BEFORE ISSUING A FINAL NEGATIVE REPORT   Report Status PENDING   Incomplete  CULTURE, BLOOD (ROUTINE X 2)     Status: None   Collection Time    05/15/12  5:40 AM      Result Value Range Status   Specimen Description BLOOD LEFT HAND   Final   Special Requests BOTTLES DRAWN AEROBIC ONLY 10CC   Final   Culture  Setup Time 05/15/2012 09:09   Final   Culture     Final   Value:        BLOOD CULTURE RECEIVED NO GROWTH TO DATE CULTURE WILL BE HELD FOR 5 DAYS BEFORE ISSUING A FINAL NEGATIVE REPORT   Report Status PENDING   Incomplete  FUNGUS CULTURE, BLOOD     Status: None   Collection Time    05/15/12  5:40 AM      Result Value Range Status   Specimen Description BLOOD LEFT HAND   Final   Special Requests BOTTLES DRAWN AEROBIC ONLY 10CC   Final   Culture NO FUNGUS ISOLATED;CULTURE IN PROGRESS FOR 7 DAYS   Final   Report Status PENDING   Incomplete  URINE CULTURE     Status: None   Collection Time    05/15/12  6:40 AM      Result Value Range Status   Specimen Description URINE, RANDOM   Final   Special Requests NONE   Final   Culture  Setup Time 05/15/2012 12:50   Final   Colony Count NO GROWTH   Final   Culture NO GROWTH   Final   Report Status 05/16/2012 FINAL   Final  GC/CHLAMYDIA PROBE AMP     Status: None   Collection Time    05/15/12  6:40 AM      Result Value Range Status   CT Probe RNA NEGATIVE  NEGATIVE Final   GC Probe RNA NEGATIVE  NEGATIVE Final   Comment: (NOTE)                                                                                              Normal Reference Range: Negative          Assay performed using the Gen-Probe APTIMA COMBO2 (R) Assay.     Acceptable specimen types for this assay include APTIMA Swabs  (Unisex,     endocervical, urethral, or vaginal), first void urine, and ThinPrep     liquid based cytology samples.  RESPIRATORY VIRUS PANEL     Status: None   Collection Time    05/15/12 10:40 AM      Result Value Range Status   Source - RVPAN NASAL SWAB   Corrected   Comment: CORRECTED ON 05/15 AT 2024: PREVIOUSLY REPORTED AS NASAL SWAB   Respiratory Syncytial Virus A NOT DETECTED   Final   Respiratory Syncytial Virus B NOT DETECTED   Final   Influenza  A NOT DETECTED   Final   Influenza B NOT DETECTED   Final   Parainfluenza 1 NOT DETECTED   Final   Parainfluenza 2 NOT DETECTED   Final   Parainfluenza 3 NOT DETECTED   Final   Metapneumovirus NOT DETECTED   Final   Rhinovirus NOT DETECTED   Final   Adenovirus NOT DETECTED   Final   Influenza A H1 NOT DETECTED   Final   Influenza A H3 NOT DETECTED   Final   Comment: (NOTE)           Normal Reference Range for each Analyte: NOT DETECTED     Testing performed using the Luminex xTAG Respiratory Viral Panel test     kit.     This test was developed and its performance characteristics determined     by [employer]. It has not been cleared or approved by the [place]     Food and Drug Administration. This test is used for clinical purposes.     It should not be regarded as investigational or for research. This     laboratory is certified under the Clinical Laboratory Improvement     Amendments of 1988 (CLIA) as qualified to perform high complexity     clinical laboratory testing.  MRSA PCR SCREENING     Status: None   Collection Time    05/15/12 10:40 AM      Result Value Range Status   MRSA by PCR NEGATIVE  NEGATIVE Final   Comment:            The GeneXpert MRSA Assay (FDA     approved for NASAL specimens     only), is one component of a     comprehensive MRSA colonization     surveillance program. It is not     intended to diagnose MRSA     infection nor to guide or     monitor treatment for     MRSA infections.     Studies/Results: No results found.  Medications: I have reviewed the patient's current medications. Scheduled Meds: . sulfamethoxazole-trimethoprim  1 tablet Oral Daily   Continuous Infusions:  PRN Meds:.alum & mag hydroxide-simeth, fentaNYL, LORazepam, midazolam, ondansetron (ZOFRAN) IV, ondansetron   Assessment/Plan: 32 yo M with AIDS and fever of unknown origin.   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 Lymphoma is also a possibility.  -Crypto antigen neg; Resp panel neg; GC/C neg; RPR neg; RMS fever neg; hep panel neg.  -Histoplasma antigen, CMV PCR, ehrlichia panel, HIV genotype, quantiferon gold pending.  -Bone marrow biopsy today. Will initiate MAI tx after bx.  -Urine culture from 5/11 and 5/14 negative.  -Blood cultures from 5/11 and 5/14 x 2 NGTD. AFB, fungus cultures pending.   AIDS  -Infectious disease following. Appreciate recs.  -PCP prophylaxis with bactrim 160 mg daily.  -Initiate ART 1 week after initiating MAI tx.   Pancytopenia  -No new labs today. -CBC in AM.  Hypoalbuminemia  -Likely 2/2 chronic disease.  -Regular diet. Encourage PO intake.   Prophylaxis: SCDs   Access: R and L forearm PIVs (NS 05/15/12)   Code: full   Dispo: floor status  -Anticipated date of discharge unknown.   This is a  Medical Student Note.  The care of the patient was discussed with Dr. Dorma Russell and the assessment and plan formulated with their assistance.  Please see their attached note for official documentation of the daily encounter.  Harland Dingwall [facility] 05/17/2012, 9:46 AM

## 2012-05-17 NOTE — Progress Notes (Signed)
Notified Dr. Earlene Plater that patient's temp this am is 100.6. Dr. Earlene Plater stated just to monitor temp. No new orders given. Will continue to monitor patient. Nelda Marseille, RN

## 2012-05-17 NOTE — Care Management Note (Addendum)
    Page 1 of 1   05/17/2012     3:46:51 PM   CARE MANAGEMENT NOTE 05/17/2012  Patient:  David Knapp,David Knapp   Account Number:  [id number]  Date Initiated:  05/17/2012  Documentation initiated by:  Letha Cape  Subjective/Objective Assessment:   dx 042  admit- lives with spouse. pta indep.     Action/Plan:   Anticipated DC Date:  05/17/2012   Anticipated DC Plan:  HOME/SELF CARE      DC Planning Services  CM consult  MATCH Program      Choice offered to / List presented to:             Status of service:  Completed, signed off Medicare Important Message given?   (If response is "NO", the following Medicare IM given date fields will be blank) Date Medicare IM given:   Date Additional Medicare IM given:    Discharge Disposition:  HOME/SELF CARE  Per UR Regulation:  Reviewed for med. necessity/level of care/duration of stay  If discussed at Long Length of Stay Meetings, dates discussed:    Comments:  05/17/12 15:05 Letha Cape RN, BSN [phone] patient lives with spouse, pta indep.  Patient needs ast with meds, asst him with the Match Program, informed patient that he needs to go to the [facility]  outpt pharmacy and that they close at 6 pm.  Patient stated he understood, there will be an interpreter here at 3:30 I will make sure she interprets this information for him as well. Patient states he has transportation.  Interpreter in patient's room , patient understands what he needs to do about his medications.

## 2012-05-17 NOTE — Progress Notes (Signed)
Resident Co-sign Daily Note: I have seen the patient and reviewed the daily progress note by Caitlin Magraw, MS-3 and discussed the care of the patient with her.  See my separate note for documentation of my findings, assessment, and plans.    LOS: 2 days   David Knapp F 05/17/2012, 6:03 PM  

## 2012-05-18 LAB — CULTURE, BLOOD (ROUTINE X 2): Culture: NO GROWTH

## 2012-05-20 ENCOUNTER — Ambulatory Visit: Payer: Self-pay

## 2012-05-20 ENCOUNTER — Inpatient Hospital Stay (HOSPITAL_COMMUNITY)
Admission: AD | Admit: 2012-05-20 | Discharge: 2012-05-23 | DRG: 975 | Disposition: A | Discharge status: Home / Self Care | Payer: Self-pay | Source: Ambulatory Visit | Attending: Internal Medicine | Admitting: Internal Medicine

## 2012-05-20 ENCOUNTER — Encounter: Payer: Self-pay | Admitting: Infectious Disease

## 2012-05-20 ENCOUNTER — Telehealth: Payer: Self-pay | Admitting: *Deleted

## 2012-05-20 ENCOUNTER — Encounter (HOSPITAL_COMMUNITY): Payer: Self-pay | Admitting: Internal Medicine

## 2012-05-20 ENCOUNTER — Ambulatory Visit (INDEPENDENT_AMBULATORY_CARE_PROVIDER_SITE_OTHER): Payer: Self-pay | Admitting: Infectious Disease

## 2012-05-20 VITALS — BP 95/61 | HR 123 | Temp 103.0°F | Ht 67.0 in | Wt 159.0 lb

## 2012-05-20 DIAGNOSIS — B20 Human immunodeficiency virus [HIV] disease: Principal | ICD-10-CM | POA: Diagnosis present

## 2012-05-20 DIAGNOSIS — I951 Orthostatic hypotension: Secondary | ICD-10-CM

## 2012-05-20 DIAGNOSIS — R21 Rash and other nonspecific skin eruption: Secondary | ICD-10-CM | POA: Diagnosis present

## 2012-05-20 DIAGNOSIS — B079 Viral wart, unspecified: Secondary | ICD-10-CM | POA: Diagnosis present

## 2012-05-20 DIAGNOSIS — B394 Histoplasmosis capsulati, unspecified: Secondary | ICD-10-CM

## 2012-05-20 DIAGNOSIS — D61818 Other pancytopenia: Secondary | ICD-10-CM

## 2012-05-20 DIAGNOSIS — Z9119 Patient's noncompliance with other medical treatment and regimen: Secondary | ICD-10-CM

## 2012-05-20 DIAGNOSIS — B399 Histoplasmosis, unspecified: Secondary | ICD-10-CM

## 2012-05-20 DIAGNOSIS — Z91199 Patient's noncompliance with other medical treatment and regimen due to unspecified reason: Secondary | ICD-10-CM

## 2012-05-20 DIAGNOSIS — R627 Adult failure to thrive: Secondary | ICD-10-CM

## 2012-05-20 DIAGNOSIS — R6251 Failure to thrive (child): Secondary | ICD-10-CM

## 2012-05-20 DIAGNOSIS — E8809 Other disorders of plasma-protein metabolism, not elsewhere classified: Secondary | ICD-10-CM | POA: Diagnosis present

## 2012-05-20 DIAGNOSIS — E869 Volume depletion, unspecified: Secondary | ICD-10-CM | POA: Diagnosis present

## 2012-05-20 DIAGNOSIS — R509 Fever, unspecified: Secondary | ICD-10-CM | POA: Diagnosis present

## 2012-05-20 HISTORY — DX: Histoplasmosis, unspecified: B39.9

## 2012-05-20 HISTORY — DX: Other pancytopenia: D61.818

## 2012-05-20 LAB — CBC WITH DIFFERENTIAL/PLATELET
Eosinophils Absolute: 0 10*3/uL (ref 0.0–0.7)
Hemoglobin: 8.9 g/dL — ABNORMAL LOW (ref 13.0–17.0)
Lymphs Abs: 0.4 10*3/uL — ABNORMAL LOW (ref 0.7–4.0)
MCH: 25.5 pg — ABNORMAL LOW (ref 26.0–34.0)
Monocytes Relative: 8 % (ref 3–12)
Neutro Abs: 1 10*3/uL — ABNORMAL LOW (ref 1.7–7.7)
Neutrophils Relative %: 66 % (ref 43–77)
Platelets: 56 10*3/uL — ABNORMAL LOW (ref 150–400)
RBC: 3.49 MIL/uL — ABNORMAL LOW (ref 4.22–5.81)
WBC: 1.5 10*3/uL — ABNORMAL LOW (ref 4.0–10.5)

## 2012-05-20 LAB — COMPREHENSIVE METABOLIC PANEL
ALT: 75 U/L — ABNORMAL HIGH (ref 0–53)
Alkaline Phosphatase: 119 U/L — ABNORMAL HIGH (ref 39–117)
BUN: 9 mg/dL (ref 6–23)
CO2: 25 mEq/L (ref 19–32)
GFR calc Af Amer: >90 mL/min (ref >90)
GFR calc non Af Amer: >90 mL/min (ref >90)
Glucose, Bld: 112 mg/dL — ABNORMAL HIGH (ref 70–99)
Potassium: 4 mEq/L (ref 3.5–5.1)
Sodium: 130 mEq/L — ABNORMAL LOW (ref 135–145)
Total Bilirubin: 0.6 mg/dL (ref 0.3–1.2)
Total Protein: 7.3 g/dL (ref 6.0–8.3)

## 2012-05-20 LAB — PROTIME-INR
INR: 1.3 (ref 0.00–1.49)
Prothrombin Time: 15.9 seconds — ABNORMAL HIGH (ref 11.6–15.2)

## 2012-05-20 LAB — APTT: aPTT: 49 seconds — ABNORMAL HIGH (ref 24–37)

## 2012-05-20 LAB — CMV (CYTOMEGALOVIRUS) DNA ULTRAQUANT, PCR

## 2012-05-20 MED ORDER — SODIUM CHLORIDE 0.9 % IV SOLN
INTRAVENOUS | Status: DC
  Administered 2012-05-20 – 2012-05-23 (×8): via INTRAVENOUS

## 2012-05-20 MED ORDER — AZITHROMYCIN 500 MG PO TABS
1250.0000 mg | ORAL_TABLET | Freq: Every day | ORAL | Status: DC
  Filled 2012-05-20: qty 1

## 2012-05-20 MED ORDER — AZITHROMYCIN 600 MG PO TABS: 1200.0000 mg | ORAL_TABLET | ORAL | Status: DC

## 2012-05-20 MED ORDER — PNEUMOCOCCAL VAC POLYVALENT 25 MCG/0.5ML IJ INJ
0.5000 mL | INJECTION | INTRAMUSCULAR | Status: AC
  Filled 2012-05-20: qty 0.5

## 2012-05-20 MED ORDER — ACETAMINOPHEN 325 MG PO TABS
325.0000 mg | ORAL_TABLET | Freq: Four times a day (QID) | ORAL | Status: DC | PRN
  Administered 2012-05-21 (×2): 325 mg via ORAL
  Filled 2012-05-20 (×2): qty 2
  Filled 2012-05-20 (×2): qty 1
  Filled 2012-05-20: qty 2

## 2012-05-20 MED ORDER — SODIUM CHLORIDE 0.9 % IV BOLUS (SEPSIS)
1000.0000 mL | Freq: Once | INTRAVENOUS | Status: AC
  Administered 2012-05-20: 1000 mL via INTRAVENOUS

## 2012-05-20 MED ORDER — AZITHROMYCIN 500 MG PO TABS: 1250.0000 mg | ORAL_TABLET | ORAL | Status: DC

## 2012-05-20 MED ORDER — SULFAMETHOXAZOLE-TMP DS 800-160 MG PO TABS
1.0000 | ORAL_TABLET | Freq: Every day | ORAL | Status: DC
  Administered 2012-05-20 – 2012-05-23 (×4): 1 via ORAL
  Filled 2012-05-20 (×5): qty 1

## 2012-05-20 MED ORDER — ACETAMINOPHEN 650 MG RE SUPP: 325.0000 mg | Freq: Four times a day (QID) | RECTAL | Status: DC | PRN

## 2012-05-20 NOTE — Progress Notes (Signed)
[facility]    Subjective:  32 year old David Knapp with PMHX significant for HIV/AIDS and disseminated histoplasmosis. He originally presented in 2008 with fever, malaise weakness, a disseminated rash, pancytopenia. At that time a punch biopsy from skin demonstrated yeast c/w histoplasma on pathology and BAL culture grew histoplasma capsulatum  with organism originally isoltaed on bronchoscopy with BAL in 2008. He was treated at that time with IV amphotericin followed by protracted sporonox. His ARV was initially with-held while he was getting AMphotericin but he was later started on Atripla, subsquently changed to Reyataz, norvir and Truvada . He was then lost to followup and had been off ARV since 2012. He in the meantime developed return of pedunculated  lesions on his hands, lips, forehead and return of rash on his legs--that he thinks is an "allergy . He was recently admitted to the B service and seen by my partner Dr. Luciana Knapp and pt underwent bone marrow biopsy to workup his pancytopenia, path report is NOT available to me in Epic. Pt also had serum crypto ag checked==negative, and urine histoplasma ag sent--> pending. He was started empirically on M Avium drugs and dc to home but was not able to afford M avium drugs due to cost of EThambutol in particular. He was brought back to clinic for followup after >2 days of fevers to 103 dizziness, weakness and light  Antibiotics:  None  Medications:  PMHx: Past surgical history and family history  See epic  Centricity and echart and above and reviewed  Objective:  Blood pressure 104/68, pulse 118, temperature 103 F (39.4 C), temperature source Oral, height 5\' 7"  (1.702 m), weight 159 lb (72.122 kg).   Physical Exam: General: Alert and awake, oriented x3, not in any acute distress.underweight HEENT: anicteric sclera, pupils reactive to light and accommodation, EOMI CVS tachycardic  regular rate, normal  r,  no murmur rubs or gallops Chest: clear to auscultation bilaterally, no wheezing, rales or rhonchi Abdomen: soft nontender, nondistended, normal bowel sounds, Extremities: no  clubbing or edema noted bilaterally  Skin: pt with pedunculated lesions on his hands, more subtle lesions similar but also on his lips, forehead and hyperpigmented ones on his arms, he has scaling rash on legs left thigh and calf  Neuro: nonfocal  Lab Results: No results found for this basename: WBC, HGB, HCT, PLT,  in the last 72 hours  BMET No results found for this basename: NA, K, CL, CO2, GLUCOSE, BUN, CREATININE, CALCIUM,  in the last 72 hours  Micro Results:   Studies/Results: No results found.    Assessment/Plan: David Knapp is a 32 y.o. 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 The diagnosis MAY already be available IF the bone marrow biopsy is revealing, or histoplasma ag in urine is POSITIVE (though per [name] the reference lab we use is not as sensitive as that of [facility]. OTherwise I would recommend 1-2 biopsies  of one of his cutaneous lesions preferabley on  fingers with specimen to Path lab for Fungal stains GMS, AFB stains, and separate specimen in sterile container for fungal culture  If he has disseminated histoplasmosis he will need to be started on "INDUCTION" therapy with IV amphotericin  As far as timing of his ARV, it is not clear to me on quick review of  literarturet there would be great danger from starting ARV therapy. He currently has NO CNS symptoms though if he has disseminated disease then CNS would be involved and that owuld make me a bit nervous. I will confer with some of my mentors from [facility] re this  #2 HIV/AIDS: he filled out ADAP application and I intend to pursue "urgent" application for therapy He will need to continue on PCP prophylaxis  I spent greater than 45 minutes with the patient  including greater than 50% of time in face to face counsel of the patient explaining the nature of HIV, histoplasmosis and in coordination of their care including his admission to the hospital using a counselor. I also counselled his former girlfriend and we did HIV rapid testng on her which was negative and HIV RNA which is pending   #3 Rash: see above discussion  #4 Pancytopenia: certainly with hx I would place more emphasis on disseminated histoplasmosis and would DC his empiric M avium drugs for now and only use prophylaxis.  #5 Fevers: see above  #6 Orthostasis: needs IVF vigorous and will need to be on NS and with pre and post bolusign if he gets IV amphotericin once we have made the diagnosis.  Acey Lav 05/20/2012, 5:35 PM

## 2012-05-20 NOTE — Progress Notes (Addendum)
David Knapp [id number] Admitted to [id number]: 05/20/2012 7:52 PM Attending Provider: Rocco Serene, MD    David Knapp is a 32 y.o. male patient admitted from ED awake, alert  & orientated  X 3,  Prior, VSS - Blood pressure 97/58, pulse 116, temperature 102.2 F (39 C), temperature source Oral, resp. rate 18, height 5\' 7"  (1.702 m), SpO2 99.00%., R/A, no c/o shortness of breath, no c/o chest pain, no distress noted. Tele # [id number] placed and pt is currently running:sinus tachycardia.   IV site WDL:  antecubital right, condition patent and no redness with a transparent dsg that's clean dry and intact.  Allergies:  No Known Allergies   Past Medical History  Diagnosis Date  . HIV (human immunodeficiency virus infection)   . 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 Possible recurrence 2014 (currently being evaluated 05/2012)  . Pancytopenia       Pt orientation to unit, room and routine with assistance from personal interpretor. Information packet given to patient/family.  Admission INP armband ID verified with patient/family, and in place. SR up x 2. Pt verbalizes an understanding of how to use the call bell and to call for help before getting out of bed.  Skin, clean-dry- intact with bruising to arms and wart like blisters to hands & face.  No evidence of skin break down noted on exam.  Dr. Sherrine Maples informed of pt. Room number and informed of BP 97/58, stated to place pt. On telemetry and was going to put in orders for IVF.  Also informed Dr. Zada Girt of pt. Temp. Of 102.2.  Will continue to monitor.     Will cont to monitor and assist as needed.  Joana Reamer, RN 05/20/2012 7:52 PM

## 2012-05-20 NOTE — Telephone Encounter (Signed)
Ok very good.

## 2012-05-20 NOTE — Telephone Encounter (Signed)
The pharmacy called to advised that the patient has not picked up his meds yet and she noticed he has a follow up appt here today and just wanted the let Dr Daiva Eves know so he could encourage the patient to do so. They are ready when he is. Advised her will let the doctor and his nurse know prior to the visit.

## 2012-05-20 NOTE — H&P (Signed)
Date: 05/20/2012               Patient Name:  David Knapp MRN: [id number]  DOB: 31-Dec-1980 Age / Sex: 32 y.o., male   PCP: No Pcp Per Patient              Medical Service: Internal Medicine Teaching Service              Attending Physician: Dr. Criselda Peaches    First Contact: Dr. Sherrine Maples Pager: [phone]  Second Contact: Dr. Everardo Beals Pager: [phone]            After Hours (After 5p/  First Contact Pager: [phone]  weekends / holidays): Second Contact Pager: [phone]     Chief Complaint: Fever  History of Present Illness: Patient is a 32 y.o. male with a PMHx of AIDS (CD4 < 10) with history of prior disseminated histoplasmosis in 2008, medication noncompliance and recent admission from 5/14-5/16 for FUO (thought possibly related to disseminated histoplasmosis vs disseminated TB vs MAC) who represents to [facility] as a direct admission from [facility] for recurrent fevers, volume depletion with orthostasis. The ongoing investigation involves evaluation of 2 year history of rash involving his hands bilaterally.  Since last hospital discharge on 5/16, he started to develop intermittent episodes of subjective high fevers on the day after discharge - these fevers specifically are worse at night and are associated with chills and nocturnal sweating (although not florid drenching sweats). Has been getting increasingly fatigued, and also reports symptoms of dizziness with prolonged standing. Denies symptoms of syncope or loss of consciousness. His appetite has been normal. He does not have nausea or vomiting. No recent sick contacts.  He was unable to refill his medications for the treatment of MAC due to cost.  Review of Systems: Constitutional:  admits to fever, chills, diaphoresis,  and fatigue. Denies appetite change.  HEENT: denies nasal congestion, sore throat, neck stiffness, changes in vision,   Respiratory: denies SOB, DOE, cough, chest tightness, and wheezing.  Cardiovascular: denies chest pain,  palpitations and leg swelling. But recently he has noticed dizziness when he stands up for a long time. No passing out.   Gastrointestinal: denies nausea, vomiting, abdominal pain, diarrhea.  Genitourinary: denies dysuria, urgency, frequency, hematuria, flank pain and difficulty urinating.  Musculoskeletal: denies  myalgias, back pain, joint swelling, arthralgias and gait problem.   Skin: admits to rash on his arms for the last two years. No open wound. No urethral discharge.   Neurological: denies  seizures, syncope, weakness, light-headedness, numbness and headaches.   Hematological: denies adenopathy, easy bruising, personal or family bleeding history.  Psychiatric/ Behavioral: denies suicidal ideation, mood changes, confusion, nervousness, sleep disturbance and agitation.    Current Outpatient Medications: NONE - is supposed to be on the following as per last DC instructions. Medications Prior to Admission  Medication Sig Dispense Refill  . azithromycin (ZITHROMAX) 500 MG tablet Take 1 tablet (500 mg total) by mouth daily.  30 tablet  3  . ethambutol (MYAMBUTOL) 400 MG tablet Take 3 tablets (1,200 mg total) by mouth daily.  120 tablet  3  . rifabutin (MYCOBUTIN) 150 MG capsule Take 2 capsules (300 mg total) by mouth daily.  30 capsule  3  . sulfamethoxazole-trimethoprim (BACTRIM DS) 800-160 MG per tablet Take 1 tablet by mouth daily.  30 tablet  3    Allergies: No Known Allergies    Past Medical History: Past Medical History  Diagnosis Date  . HIV (human immunodeficiency virus  infection)   . 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 Possible recurrence 2014 (currently being evaluated 05/2012)  . Pancytopenia     Past Surgical History: Past Surgical History  Procedure Laterality Date  . No past surgeries      Family History: Family History  Problem Relation Age of Onset  . Coronary artery disease Mother     died from MI @ age 60 yo    Social  History: History   Social History  . Marital Status: Single    Spouse Name: N/A    Number of Children: 0  . Years of Education: 6th grade   Occupational History  . Landscaping    Social History Main Topics  . Smoking status: Never Smoker   . Smokeless tobacco: Never Used  . Alcohol Use: 3.0 oz/week    6 drink(s) per week  . Drug Use: No  . Sexually Active: No     Comment: given condoms   Other Topics Concern  . Not on file   Social History Narrative   Lives in [place] with his wife and 3 nephews/nieces. Is spanish speaking only - requiring interpretor. He is able to read and write in Spanish only     Vital Signs: Blood pressure 95/54, pulse 98, temperature 101.7 F (38.7 C), temperature source Oral, resp. rate 20, height 5\' 7"  (1.702 m), SpO2 96.00%.  Physical Exam: General: Vital signs reviewed and noted. Interpreter at beside. Well-developed, well-nourished, in no acute distress; alert, appropriate and cooperative throughout examination. Febrile to touch. No palpable lymph nodes.   Head: Normocephalic, atraumatic.  Eyes: PERRL, EOMI, No signs of anemia or jaundince.  Nose: Mucous membranes moist, not inflammed, nonerythematous.  Throat: Oropharynx nonerythematous, no exudate appreciated. No oral thrush    Neck: No deformities, masses, or tenderness noted.Supple, No carotid Bruits, no JVD.  Lungs:  Normal respiratory effort. Clear to auscultation BL without crackles or wheezes.  Heart: RRR. S1 and S2 normal without gallop, murmur, or rubs.  Abdomen:  BS normoactive. Soft, Nondistended, non-tender. No masses or organomegaly.  Extremities: No pretibial edema.  Neurologic: A&O X3, CN II - XII are grossly intact. Motor strength is 5/5 in the all 4 extremities, Sensations intact to light touch, Cerebellar signs negative.  Skin: Multiple pedunculated lesions on his hands on both palmar and dorsal aspects, but also on his lips, forehead and hyperpigmented ones on his arms,  he has scaling rash on legs left thigh and calf. No open ulcers.    Lab results: Labs: Basic Metabolic Panel:  Recent Labs Lab 05/15/12 0620 05/16/12 0600 05/17/12 1022  NA 133* 131* 129*  K 3.5 4.1 4.1  CL 101 102 99  CO2 24 23 24   GLUCOSE 109* 103* 95  BUN 11 6 11   CREATININE 0.72 0.97 1.02  CALCIUM 7.5* 7.8* 8.2*    Liver Function Tests:  Recent Labs Lab 05/15/12 0620 05/16/12 0600 05/17/12 1022  AST 85* 86* 99*  ALT 59* 60* 67*  ALKPHOS 116 122* 134*  BILITOT 0.5 0.5 0.6  PROT 6.7 6.8 7.6  ALBUMIN 2.0* 2.2* 2.5*    CBC:  Recent Labs Lab 05/15/12 0508 05/16/12 0600 05/17/12 1022  WBC 1.8* 1.9* 1.6*  NEUTROABS 1.2*  --   --   HGB 9.9* 9.4* 10.2*  HCT 29.9* 28.2* 30.2*  MCV 77.1* 77.9* 76.6*  PLT 53* 47* 52*    Microbiology:  Study Date of Collection Preliminary Result Final Result    Blood  culture 1 of 2 05/15/2012 Negative  Pending    Blood culture 2 of 2  05/15/2012 Negative  Pending    Fungal blood culture 05/15/2012 Negative  Pending    Urine culture 05/15/2012 -- Negative    GC/ Chlamydia probe 05/15/2012 -- Negative    Respiratory virus panel 05/15/2012 -- Negative    AFB Blood culture 1 of 2  05/15/2012  Negative Pending    AFB Blood culture 2 of 2 05/15/2012  Negative Pending    Blood culture 1 of 2 05/17/2012 Negative Pending    Blood culture 2 of 2 05/17/2012 Negative Pending    Bone biopsy  05/17/2012  Pending  Pending     Fungal blood culture 05/17/2012 Negative Pending    Imaging results:  No results found.   Assessment & Plan:  Pt is a 32 y.o. yo male with a PMHx of  HIV/AIDs with medical noncompliance with CD4 less than 10 and HIV viral load of 62,000 who was admitted on 05/20/2012 with symptoms of fever, general body weakness and progressive dizziness for three days, which was determined to be secondary to fever of unknwn origin in the setting of HIV/AIDS. Interventions at this time will be focused on establishing the  diagnosis and treating orthostatic hypotension.    Principal Problem:   Fever Active Problems:   HIV DISEASE   WARTS, BOTH HANDS   HISTOPLASMOSIS, DISSEMINATED   Pancytopenia   Orthostatic hypotension  1) Fever of undetermined origin - in the setting of HIV: As per Dr Daiva Eves who evaluated the patient in the clinic likely ddx for the fever include disseminated histoplasmosis which has likely recurred. He was treated in 2008 for the same. Another possibility include MAC infection. He did not refill his azithromycin, ethambutol, and rifabutin, which had been prescribed on his last admission.  Plan  - Obtain results of the bone marrow biopsy performed on 5/16. No results in epic.  - Histoplasma ag in urine ordered. Test less sensitive compared to [facility] as noted from Dr Zenaida Niece Dam's note.  - Dr Daiva Eves recommends 1-2 biopsies of one of his cutaneous lesions preferabley on fingers with specimen to Path lab for Fungal stains GMS, AFB stains, and separate specimen in sterile container for fungal culture  - If he has disseminated histoplasmosis he will need to be started on "INDUCTION" therapy with IV amphotericin   2) Orthostatic Hypotension: Patient was found to be orthostatic in the clinic. When he was evaluated by Dr. them. He reports some symptoms of dizziness, especially with long-standing. However, he has not passed out. Plan. -repeat orthostatic vitals now and daily  -Normal saline bolus 1 L. -Continue with normal saline at 150 cc per minute. -Monitor vitals - he may need another bolus if he remains orthostatic.  3) HIV/AIDS: CD4 on 5/14  less than 10. Viral load is 62,000. Patient has been off medication for about 2 years after getting lost to follow up in 2012. The timing for the initiation of his antiretroviral treatment will be deferred to the infectious diseases consult. Plan. -Azithromycin weekly for MAC prophylaxis. -Bactrim prophylaxis.  4) Rash:  as discussed in problem #1,  the most likely etiology of his rash is recurrence of histoplasmosis. As per Dr. Zenaida Niece dam, these lesions will need to be biopsied for further evaluation.   5) Pancytopenia: Patient has low WBC counts of 1.5. 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  Plan  As per Dr Daiva Eves, more  emphasis will be placed on disseminated histoplasmosis. He recommended DC his empiric M avium drugs for now and only use prophylaxis.      DVT PPX - SCD's while in bed  CODE STATUS - Full  CONSULTS PLACED - ID  DISPO - Disposition is deferred at this time, awaiting improvement of fevers and evaluation of contributing causes.   Anticipated discharge in approximately 3-4 day(s).   The patient does not have a current PCP (No Pcp Per Patient) and does need an [facility] hospital follow-up appointment after discharge.    Is the [facility] hospital follow-up appointment a one-time only appointment? no.  Does the patient have transportation limitations that hinder transportation to clinic appointments? no   Signed:  Dow Adolph, MD PGY-1 Internal Medicine Teaching Service Pager: [phone] (7PM-7AM) 05/20/2012, 9:50 PM

## 2012-05-21 ENCOUNTER — Inpatient Hospital Stay (HOSPITAL_COMMUNITY): Payer: MEDICAID

## 2012-05-21 ENCOUNTER — Encounter (HOSPITAL_COMMUNITY): Payer: Self-pay | Admitting: *Deleted

## 2012-05-21 DIAGNOSIS — B394 Histoplasmosis capsulati, unspecified: Secondary | ICD-10-CM

## 2012-05-21 DIAGNOSIS — Z21 Asymptomatic human immunodeficiency virus [HIV] infection status: Secondary | ICD-10-CM

## 2012-05-21 DIAGNOSIS — D61818 Other pancytopenia: Secondary | ICD-10-CM

## 2012-05-21 DIAGNOSIS — B399 Histoplasmosis, unspecified: Secondary | ICD-10-CM

## 2012-05-21 DIAGNOSIS — I951 Orthostatic hypotension: Secondary | ICD-10-CM

## 2012-05-21 DIAGNOSIS — B079 Viral wart, unspecified: Secondary | ICD-10-CM

## 2012-05-21 LAB — CSF CELL COUNT WITH DIFFERENTIAL
Eosinophils, CSF: NONE SEEN % (ref 0–1)
Segmented Neutrophils-CSF: NONE SEEN % (ref 0–6)

## 2012-05-21 LAB — CULTURE, BLOOD (ROUTINE X 2): Culture: NO GROWTH

## 2012-05-21 LAB — CBC
MCHC: 33.6 g/dL (ref 30.0–36.0)
Platelets: 52 10*3/uL — ABNORMAL LOW (ref 150–400)
RDW: 13.9 % (ref 11.5–15.5)
WBC: 1.4 10*3/uL — CL (ref 4.0–10.5)

## 2012-05-21 LAB — FUNGAL ANTIBODIES PANEL, ID-BLOOD
Aspergillus Flavus Antibodies: NEGATIVE
Blastomyces Abs, Qn, DID: NEGATIVE
Coccidioides Antibody ID: NEGATIVE
Histoplasma Ab, Immunodiffusion: NEGATIVE

## 2012-05-21 LAB — BASIC METABOLIC PANEL
CO2: 24 mEq/L (ref 19–32)
Calcium: 7.9 mg/dL — ABNORMAL LOW (ref 8.4–10.5)
Chloride: 102 mEq/L (ref 96–112)
Glucose, Bld: 110 mg/dL — ABNORMAL HIGH (ref 70–99)
Sodium: 132 mEq/L — ABNORMAL LOW (ref 135–145)

## 2012-05-21 LAB — GRAM STAIN

## 2012-05-21 MED ORDER — DIPHENHYDRAMINE HCL 25 MG PO CAPS
25.0000 mg | ORAL_CAPSULE | ORAL | Status: DC
  Administered 2012-05-21 – 2012-05-23 (×3): 25 mg via ORAL
  Filled 2012-05-21 (×5): qty 1

## 2012-05-21 MED ORDER — SODIUM CHLORIDE 0.9 % IV BOLUS (SEPSIS)
500.0000 mL | INTRAVENOUS | Status: DC
  Administered 2012-05-21 – 2012-05-23 (×3): 500 mL via INTRAVENOUS

## 2012-05-21 MED ORDER — SODIUM CHLORIDE 0.9 % IV BOLUS (SEPSIS): 1000.0000 mL | Freq: Once | INTRAVENOUS | Status: DC

## 2012-05-21 MED ORDER — MEPERIDINE HCL 25 MG/ML IJ SOLN: 25.0000 mg | INTRAMUSCULAR | Status: DC | PRN

## 2012-05-21 MED ORDER — MORPHINE SULFATE 2 MG/ML IJ SOLN: 1.0000 mg | INTRAMUSCULAR | Status: DC | PRN

## 2012-05-21 MED ORDER — DEXTROSE 5 % IV SOLN
5.0000 mg/kg | INTRAVENOUS | Status: DC
  Administered 2012-05-22 – 2012-05-23 (×2): 355 mg via INTRAVENOUS
  Filled 2012-05-21 (×3): qty 71

## 2012-05-21 MED ORDER — AMPHOTERICIN B LIPID 5 MG/ML IV SUSP
5.0000 mg/kg | Freq: Once | INTRAVENOUS | Status: AC
  Administered 2012-05-21: 355 mg via INTRAVENOUS
  Filled 2012-05-21: qty 71

## 2012-05-21 MED ORDER — ENSURE COMPLETE PO LIQD
237.0000 mL | Freq: Three times a day (TID) | ORAL | Status: DC
  Administered 2012-05-21 – 2012-05-23 (×5): 237 mL via ORAL

## 2012-05-21 MED ORDER — SODIUM CHLORIDE 0.9 % IV BOLUS (SEPSIS)
500.0000 mL | INTRAVENOUS | Status: DC
  Administered 2012-05-21 – 2012-05-23 (×4): 500 mL via INTRAVENOUS

## 2012-05-21 MED ORDER — SODIUM CHLORIDE 0.9 % IV BOLUS (SEPSIS)
1000.0000 mL | Freq: Once | INTRAVENOUS | Status: AC
  Administered 2012-05-21: 1000 mL via INTRAVENOUS

## 2012-05-21 MED ORDER — DIPHENHYDRAMINE HCL 50 MG/ML IJ SOLN
25.0000 mg | INTRAMUSCULAR | Status: DC
  Filled 2012-05-21 (×2): qty 0.5

## 2012-05-21 MED ORDER — ACETAMINOPHEN 325 MG PO TABS
650.0000 mg | ORAL_TABLET | ORAL | Status: DC
  Administered 2012-05-21 – 2012-05-23 (×3): 650 mg via ORAL
  Filled 2012-05-21 (×2): qty 1

## 2012-05-21 MED ORDER — DEXTROSE 5 % IV SOLN
5.0000 mg/kg | INTRAVENOUS | Status: DC
  Filled 2012-05-21: qty 71

## 2012-05-21 MED ORDER — SODIUM CHLORIDE 0.9 % IV SOLN: INTRAVENOUS | Status: DC

## 2012-05-21 NOTE — Progress Notes (Signed)
Interpreter Wyvonnia Dusky for Internal medicine team

## 2012-05-21 NOTE — Progress Notes (Signed)
Pt return from radiology for lumbar puncture temp 102.6 MD notified. Instructed to recheck temp in one hour. Pt unable to take po or rectal tylenol d/t has to lay flat till 5 pm.

## 2012-05-21 NOTE — Progress Notes (Signed)
CRITICAL VALUE ALERT  Critical value received:  WBC 1.4  Date of notification:  05/21/12  Time of notification:  0610  Critical value read back:yes  Nurse who received alert:  Marlaine Hind  MD notified (1st page):  Teaching service  Time of first page:  0610  MD notified (2nd page):  Time of second page:  Responding MD:  Zada Girt  Time MD responded:  [phone]

## 2012-05-21 NOTE — H&P (Signed)
Internal Medicine Teaching Service Attending Note Date: 05/21/2012  Patient name: David Knapp  Medical record number: [id number]  Date of birth: 27-Dec-1980   CC: Fever, unable to afford medications  I have seen and evaluated David Knapp and discussed their care with the Residency Team.  `  David Knapp is a 32yo male admitted for fevers.  He was recently discharged from [facility] hospital for presumed MAI and was set to get therapy outpatient.  However, it appears he went to the wrong pharmacy and was asked to pay for the medications and could not afford them.  He was set up to get the medications for free from the correct pharmacy.  Regardless, he presents again with similar symptoms as before.  He continues to have rash on his legs, wart like skin changes to his hands, nocturnal sweating, chills, fatigue and dizziness upon standing.  He denies syncope, LOC, nausea, vomiting, decreased appetite, sick contacts.  He has HIV/AIDS and most recently has been off his ART.  His CD4 count is very low with a high viral load.   For further medical history, ROS, please see resident note.   Physical Exam: Blood pressure 100/56, pulse 88, temperature 98.6 F (37 C), temperature source Oral, resp. rate 20, height 5\' 7"  (1.702 m), weight 156 lb 8.4 oz (71 kg), SpO2 96.00%. General appearance: alert, cooperative, appears stated age, no distress and spanish speaking, interpreter in room Head: Normocephalic, without obvious abnormality, atraumatic Eyes: EOMI, anicteric sclerae Lungs: clear to auscultation bilaterally and no wheezing Heart: RR, NR, no murmur Abdomen: soft, NT Extremities: thin, + scaly rash to bilateral thighs, unchanged.  + wart like rash to hands, unchanged Pulses: 2+ and symmetric Lymph nodes: Cervical, supraclavicular, and axillary nodes normal.  Lab results: Results for orders placed during the hospital encounter of 05/20/12 (from the past 24 hour(s))  MAGNESIUM     Status: None   Collection  Time    05/20/12  9:11 PM      Result Value Range   Magnesium 2.1  1.5 - 2.5 mg/dL  PHOSPHORUS     Status: None   Collection Time    05/20/12  9:11 PM      Result Value Range   Phosphorus 3.0  2.3 - 4.6 mg/dL  COMPREHENSIVE METABOLIC PANEL     Status: Abnormal   Collection Time    05/20/12  9:11 PM      Result Value Range   Sodium 130 (*) 135 - 145 mEq/L   Potassium 4.0  3.5 - 5.1 mEq/L   Chloride 99  96 - 112 mEq/L   CO2 25  19 - 32 mEq/L   Glucose, Bld 112 (*) 70 - 99 mg/dL   BUN 9  6 - 23 mg/dL   Creatinine, Ser 4.09  0.50 - 1.35 mg/dL   Calcium 7.8 (*) 8.4 - 10.5 mg/dL   Total Protein 7.3  6.0 - 8.3 g/dL   Albumin 2.3 (*) 3.5 - 5.2 g/dL   AST 811 (*) 0 - 37 U/L   ALT 75 (*) 0 - 53 U/L   Alkaline Phosphatase 119 (*) 39 - 117 U/L   Total Bilirubin 0.6  0.3 - 1.2 mg/dL   GFR calc non Af Amer >90  >90 mL/min   GFR calc Af Amer >90  >90 mL/min  CBC WITH DIFFERENTIAL     Status: Abnormal   Collection Time    05/20/12  9:11 PM      Result Value Range  WBC 1.5 (*) 4.0 - 10.5 K/uL   RBC 3.49 (*) 4.22 - 5.81 MIL/uL   Hemoglobin 8.9 (*) 13.0 - 17.0 g/dL   HCT 11.9 (*) 14.7 - 82.9 %   MCV 75.6 (*) 78.0 - 100.0 fL   MCH 25.5 (*) 26.0 - 34.0 pg   MCHC 33.7  30.0 - 36.0 g/dL   RDW 56.2  13.0 - 86.5 %   Platelets 56 (*) 150 - 400 K/uL   Neutrophils Relative % 66  43 - 77 %   Neutro Abs 1.0 (*) 1.7 - 7.7 K/uL   Lymphocytes Relative 24  12 - 46 %   Lymphs Abs 0.4 (*) 0.7 - 4.0 K/uL   Monocytes Relative 8  3 - 12 %   Monocytes Absolute 0.1  0.1 - 1.0 K/uL   Eosinophils Relative 1  0 - 5 %   Eosinophils Absolute 0.0  0.0 - 0.7 K/uL   Basophils Relative 1  0 - 1 %   Basophils Absolute 0.0  0.0 - 0.1 K/uL  APTT     Status: Abnormal   Collection Time    05/20/12  9:11 PM      Result Value Range   aPTT 49 (*) 24 - 37 seconds  PROTIME-INR     Status: Abnormal   Collection Time    05/20/12  9:11 PM      Result Value Range   Prothrombin Time 15.9 (*) 11.6 - 15.2 seconds   INR  1.30  0.00 - 1.49  BASIC METABOLIC PANEL     Status: Abnormal   Collection Time    05/21/12  5:00 AM      Result Value Range   Sodium 132 (*) 135 - 145 mEq/L   Potassium 3.9  3.5 - 5.1 mEq/L   Chloride 102  96 - 112 mEq/L   CO2 24  19 - 32 mEq/L   Glucose, Bld 110 (*) 70 - 99 mg/dL   BUN 8  6 - 23 mg/dL   Creatinine, Ser 7.84  0.50 - 1.35 mg/dL   Calcium 7.9 (*) 8.4 - 10.5 mg/dL   GFR calc non Af Amer >90  >90 mL/min   GFR calc Af Amer >90  >90 mL/min  CBC     Status: Abnormal   Collection Time    05/21/12  5:00 AM      Result Value Range   WBC 1.4 (*) 4.0 - 10.5 K/uL   RBC 3.16 (*) 4.22 - 5.81 MIL/uL   Hemoglobin 8.1 (*) 13.0 - 17.0 g/dL   HCT 69.6 (*) 29.5 - 28.4 %   MCV 76.3 (*) 78.0 - 100.0 fL   MCH 25.6 (*) 26.0 - 34.0 pg   MCHC 33.6  30.0 - 36.0 g/dL   RDW 13.2  44.0 - 10.2 %   Platelets 52 (*) 150 - 400 K/uL    Imaging results:  No results found.  Assessment and Plan: I agree with the formulated Assessment and Plan with the following changes:   1. Disseminated histoplasmosis - BM biopsy appears to show histo, which he has had before - LP by fluoro prior to initiation of amphoteracin B - ID consult - Initiate anti-fungal after LP - No need for bx of hands given BM Bx findings - IVF fluids to support blood pressure - Bactrim/Azithro prophylaxis - ART per ID  Further issues per resident note.   Inez Catalina, MD 5/20/201412:45 PM

## 2012-05-21 NOTE — Progress Notes (Signed)
Subjective: Denies any pain, endorses fevers and chills.   Objective: Vital signs in last 24 hours: Filed Vitals:   05/21/12 0541 05/21/12 0542 05/21/12 0543 05/21/12 0729  BP: 94/54 87/47 79/41  100/56  Pulse: 98 108 101 88  Temp: 101.8 F (38.8 C)   98.6 F (37 C)  TempSrc: Oral   Oral  Resp: 20     Height:      Weight:      SpO2:       Weight change:   Intake/Output Summary (Last 24 hours) at 05/21/12 1039 Last data filed at 05/21/12 1006  Gross per 24 hour  Intake   1125 ml  Output   1550 ml  Net   -425 ml   Vitals reviewed. General: resting in bed, NAD HEENT: PERRL, EOMI, no scleral icterus Cardiac: RRR, no rubs, murmurs or gallops Pulm: clear to auscultation bilaterally, no wheezes, rales, or rhonchi Abd: soft, nontender, nondistended, BS present Ext: warm and well perfused, no pedal edema Neuro: alert and oriented X3, nonfocal  Lab Results: Basic Metabolic Panel:  Recent Labs Lab 05/20/12 2111 05/21/12 0500  NA 130* 132*  K 4.0 3.9  CL 99 102  CO2 25 24  GLUCOSE 112* 110*  BUN 9 8  CREATININE 0.72 0.68  CALCIUM 7.8* 7.9*  MG 2.1  --   PHOS 3.0  --    Liver Function Tests:  Recent Labs Lab 05/17/12 1022 05/20/12 2111  AST 99* 106*  ALT 67* 75*  ALKPHOS 134* 119*  BILITOT 0.6 0.6  PROT 7.6 7.3  ALBUMIN 2.5* 2.3*   CBC:  Recent Labs Lab 05/15/12 0508  05/20/12 2111 05/21/12 0500  WBC 1.8*  < > 1.5* 1.4*  NEUTROABS 1.2*  --  1.0*  --   HGB 9.9*  < > 8.9* 8.1*  HCT 29.9*  < > 26.4* 24.1*  MCV 77.1*  < > 75.6* 76.3*  PLT 53*  < > 56* 52*  < > = values in this interval not displayed.  Coagulation:  Recent Labs Lab 05/20/12 2111  LABPROT 15.9*  INR 1.30   Urine Drug Screen: Drugs of Abuse     Component Value Date/Time   LABOPIA NONE DETECTED 05/15/2012 1054   COCAINSCRNUR NONE DETECTED 05/15/2012 1054   LABBENZ NONE DETECTED 05/15/2012 1054   AMPHETMU NONE DETECTED 05/15/2012 1054   THCU NONE DETECTED 05/15/2012 1054   LABBARB NONE DETECTED 05/15/2012 1054    Urinalysis:  Recent Labs Lab 05/15/12 0640  COLORURINE AMBER*  LABSPEC 1.025  PHURINE 7.5  GLUCOSEU NEGATIVE  HGBUR NEGATIVE  BILIRUBINUR NEGATIVE  KETONESUR NEGATIVE  PROTEINUR NEGATIVE  UROBILINOGEN 1.0  NITRITE NEGATIVE  LEUKOCYTESUR NEGATIVE    Micro Results: Recent Results (from the past 240 hour(s))  URINE CULTURE     Status: None   Collection Time    05/12/12  8:48 AM      Result Value Range Status   Specimen Description URINE, RANDOM   Final   Special Requests NONE   Final   Culture  Setup Time 05/12/2012 19:58   Final   Colony Count NO GROWTH   Final   Culture NO GROWTH   Final   Report Status 05/13/2012 FINAL   Final  CULTURE, BLOOD (ROUTINE X 2)     Status: None   Collection Time    05/12/12  9:23 AM      Result Value Range Status   Specimen Description BLOOD RIGHT HAND   Final  Special Requests BOTTLES DRAWN AEROBIC ONLY 8CC   Final   Culture  Setup Time 05/12/2012 18:46   Final   Culture NO GROWTH 5 DAYS   Final   Report Status 05/18/2012 FINAL   Final  CULTURE, BLOOD (ROUTINE X 2)     Status: None   Collection Time    05/12/12  9:25 AM      Result Value Range Status   Specimen Description BLOOD RIGHT ARM   Final   Special Requests BOTTLES DRAWN AEROBIC ONLY 10CC   Final   Culture  Setup Time 05/12/2012 18:46   Final   Culture NO GROWTH 5 DAYS   Final   Report Status 05/18/2012 FINAL   Final  AFB CULTURE, BLOOD     Status: None   Collection Time    05/12/12  2:03 PM      Result Value Range Status   Specimen Description BLOOD RIGHT ARM   Final   Special Requests BOTTLES DRAWN AEROBIC ONLY 5CC   Final   Culture     Final   Value: CULTURE WILL BE EXAMINED FOR 6 WEEKS BEFORE ISSUING A FINAL REPORT   Report Status PENDING   Incomplete  CULTURE, BLOOD (ROUTINE X 2)     Status: None   Collection Time    05/15/12  5:35 AM      Result Value Range Status   Specimen Description BLOOD LEFT ARM   Final   Special  Requests BOTTLES DRAWN AEROBIC ONLY 10CC   Final   Culture  Setup Time 05/15/2012 09:09   Final   Culture NO GROWTH 5 DAYS   Final   Report Status 05/21/2012 FINAL   Final  CULTURE, BLOOD (ROUTINE X 2)     Status: None   Collection Time    05/15/12  5:40 AM      Result Value Range Status   Specimen Description BLOOD LEFT HAND   Final   Special Requests BOTTLES DRAWN AEROBIC ONLY 10CC   Final   Culture  Setup Time 05/15/2012 09:09   Final   Culture NO GROWTH 5 DAYS   Final   Report Status 05/21/2012 FINAL   Final  FUNGUS CULTURE, BLOOD     Status: None   Collection Time    05/15/12  5:40 AM      Result Value Range Status   Specimen Description BLOOD LEFT HAND   Final   Special Requests BOTTLES DRAWN AEROBIC ONLY 10CC   Final   Culture NO FUNGUS ISOLATED;CULTURE IN PROGRESS FOR 7 DAYS   Final   Report Status PENDING   Incomplete  URINE CULTURE     Status: None   Collection Time    05/15/12  6:40 AM      Result Value Range Status   Specimen Description URINE, RANDOM   Final   Special Requests NONE   Final   Culture  Setup Time 05/15/2012 12:50   Final   Colony Count NO GROWTH   Final   Culture NO GROWTH   Final   Report Status 05/16/2012 FINAL   Final  GC/CHLAMYDIA PROBE AMP     Status: None   Collection Time    05/15/12  6:40 AM      Result Value Range Status   CT Probe RNA NEGATIVE  NEGATIVE Final   GC Probe RNA NEGATIVE  NEGATIVE Final   Comment: (NOTE)  Normal Reference Range: Negative          Assay performed using the Gen-Probe APTIMA COMBO2 (R) Assay.     Acceptable specimen types for this assay include APTIMA Swabs (Unisex,     endocervical, urethral, or vaginal), first void urine, and ThinPrep     liquid based cytology samples.  RESPIRATORY VIRUS PANEL     Status: None   Collection Time    05/15/12 10:40 AM      Result Value Range Status   Source - RVPAN NASAL SWAB    Corrected   Comment: CORRECTED ON 05/15 AT 2024: PREVIOUSLY REPORTED AS NASAL SWAB   Respiratory Syncytial Virus A NOT DETECTED   Final   Respiratory Syncytial Virus B NOT DETECTED   Final   Influenza A NOT DETECTED   Final   Influenza B NOT DETECTED   Final   Parainfluenza 1 NOT DETECTED   Final   Parainfluenza 2 NOT DETECTED   Final   Parainfluenza 3 NOT DETECTED   Final   Metapneumovirus NOT DETECTED   Final   Rhinovirus NOT DETECTED   Final   Adenovirus NOT DETECTED   Final   Influenza A H1 NOT DETECTED   Final   Influenza A H3 NOT DETECTED   Final   Comment: (NOTE)           Normal Reference Range for each Analyte: NOT DETECTED     Testing performed using the Lumine[employer] xTAG Respiratory Viral Panel test     kit.     This test was developed and its performance characteristics determined     by [employer]. It has not been cleared or approved by the [place]     Food and Drug Administration. This test is used for clinical purposes.     It should not be regarded as investigational or for research. This     laboratory is certified under the Clinical Laboratory Improvement     Amendments of 1988 (CLIA) as qualified to perform high complexity     clinical laboratory testing.  MRSA PCR SCREENING     Status: None   Collection Time    05/15/12 10:40 AM      Result Value Range Status   MRSA by PCR NEGATIVE  NEGATIVE Final   Comment:            The GeneXpert MRSA Assay (FDA     approved for NASAL specimens     only), is one component of a     comprehensive MRSA colonization     surveillance program. It is not     intended to diagnose MRSA     infection nor to guide or     monitor treatment for     MRSA infections.  AFB CULTURE, BLOOD     Status: None   Collection Time    05/16/12 12:20 AM      Result Value Range Status   Specimen Description BLOOD LEFT ARM   Final   Special Requests 5CC   Final   Culture     Final   Value: CULTURE WILL BE EXAMINED FOR 6 WEEKS BEFORE ISSUING A  FINAL REPORT   Report Status PENDING   Incomplete  AFB CULTURE, BLOOD     Status: None   Collection Time    05/17/12 10:01 AM      Result Value Range Status   Specimen Description BONE MARROW   Final   Special Requests AFB ONLY/2CC  Final   Culture     Final   Value: CULTURE WILL BE EXAMINED FOR 6 WEEKS BEFORE ISSUING A FINAL REPORT   Report Status PENDING   Incomplete  CULTURE, BLOOD (ROUTINE X 2)     Status: None   Collection Time    05/17/12 10:01 AM      Result Value Range Status   Specimen Description BONE MARROW   Final   Special Requests BOTTLES DRAWN AEROBIC ONLY 2CC   Final   Culture  Setup Time 05/18/2012 08:37   Final   Culture     Final   Value:        BLOOD CULTURE RECEIVED NO GROWTH TO DATE CULTURE WILL BE HELD FOR 5 DAYS BEFORE ISSUING A FINAL NEGATIVE REPORT   Report Status PENDING   Incomplete  FUNGUS CULTURE, BLOOD     Status: None   Collection Time    05/17/12 10:01 AM      Result Value Range Status   Specimen Description BONE MARROW   Final   Special Requests BOTTLES DRAWN AEROBIC ONLY 2CC   Final   Culture NO FUNGUS ISOLATED;CULTURE IN PROGRESS FOR 7 DAYS   Final   Report Status PENDING   Incomplete   Studies/Results: No results found. Medications: I have reviewed the patient's current medications. Scheduled Meds: . [START ON 05/27/2012] azithromycin  1,200 mg Oral Weekly  . pneumococcal 23 valent vaccine  0.5 mL Intramuscular Tomorrow-1000  . sulfamethoxazole-trimethoprim  1 tablet Oral Daily   Continuous Infusions: . sodium chloride 150 mL/hr at 05/21/12 0515   PRN Meds:.acetaminophen, acetaminophen  Assessment/Plan: Patient is a 32 y.o. male with a PMHx of AIDS (CD4 < 10) with history of prior disseminated histoplasmosis in 2008, medication noncompliance and recent admission from 5/14-5/16 for FUO (thought possibly related to disseminated histoplasmosis vs disseminated TB vs MAC) who returns to [facility] as a direct admission from [facility] for recurrent  fevers, volume depletion with orthostasis.  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 He was treated in 2008 for the same. Another possibility includes MAC infection. He did not refill his azithromycin, ethambutol, and rifabutin, which had been prescribed on his last admission due to the cost- he was to receive a 30 day supply from [facility] prior to his last discharge which would have been $11.  Results from the bone marrow biopsy performed on 5/16 are still pending, but Dr. Ninetta Lights called the Pathologist and the results show histoplasmosis. Histoplasma ag in urine ordered, which is less sensitive compared to [facility] as noted from Dr Clinton Gallant note, so will obtain LP and send for fungal culture and also Histo Ag testing to send to [facility]. Still febrile today. Obtaining LP- unsuccessful LP at bedside, CT head w/ w/o done 1st  and LP under flouro done 5/20, removing 15mL CSF. Will start histo treatment after LP.  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 Patient was found to be orthostatic in the clinic. When he was evaluated by Dr. Terri Piedra. He reports some symptoms of dizziness, especially with long-standing. Bolused 2L with NS and BP improved. May need another bolus if BP remains low - NS@150    HIV/AIDS: CD4 on 5/14 less than 10. Viral load is 62,000. Patient has  been off medication for about 2 years after getting lost to follow up in 2012. The timing for the initiation of his antiretroviral treatment has been deferred to ID.  - Azithromycin weekly for MAC prophylaxis.  - Bactrim prophylaxis.  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 With his bone marrow biopsy results, holding off on biopsying the lesions on his legs and hands and treating for disseminated histo after LP done.   Pancytopenia: Patient has low WBC counts of 1.5. Presumed from HIV.   DVT PPx:   Dispo: Disposition is deferred at this time, awaiting improvement of current medical problems.  Anticipated discharge in approximately 1-3 day(s).   The patient does not have a current PCP (No PCP Per Patient), therefore will not be requiring OPC follow-up after discharge, will f/i with ID Clinic.   The patient does not have transportation limitations that hinder transportation to clinic appointments.  .Services Needed at time of discharge: Y = Yes, Blank = No PT:   OT:   RN:   Equipment:   Other:     LOS: 1 day   Genelle Gather 05/21/2012, 10:39 AM

## 2012-05-21 NOTE — Progress Notes (Signed)
Medical Student Daily Progress Note  Subjective: Readmitted overnight.  Hypotensive overnight requiring two 1 L bolus of NS.  C/o feeling hot this AM.  Otherwise no complaints.     Objective: Vital signs in last 24 hours: Filed Vitals:   05/21/12 0541 05/21/12 0542 05/21/12 0543 05/21/12 0729  BP: 94/54 87/47 79/41  100/56  Pulse: 98 108 101 88  Temp: 101.8 F (38.8 C)   98.6 F (37 C)  TempSrc: Oral   Oral  Resp: 20     Height:      Weight:      SpO2:       Weight change:   Intake/Output Summary (Last 24 hours) at 05/21/12 0935 Last data filed at 05/21/12 0600  Gross per 24 hour  Intake   1125 ml  Output    250 ml  Net    875 ml    Physical Exam: BP 100/56  Pulse 88  Temp(Src) 98.6 F (37 C) (Oral)  Resp 20  Ht 5\' 7"  (1.702 m)  Wt 71 kg (156 lb 8.4 oz)  BMI 24.51 kg/m2  SpO2 96% General appearance: alert, cooperative, appears stated age and no distress. Laying in bed.  Head: Normocephalic, without obvious abnormality, atraumatic  Eyes: EOMI. Sclera clear.  Throat: mucosa erythematous  Neck: no adenopathy, supple, symmetrical, trachea midline and thyroid not enlarged, symmetric, no tenderness/mass/nodules  Lungs: clear to auscultation bilaterally  Heart: regular rate and rhythm, S1, S2 normal, no murmur, click, rub or gallop  Abdomen: soft, non-tender; bowel sounds normal; no masses, no organomegaly  Extremities: no cyanosis or edema  Pulses: 2+ and symmetric  Skin: multiple papular lesions on BL hands, nontender, nonerythematous. Plaque/eczema like rash, dark brown with white streaks, on BL thighs and L lateral thigh, nontender, nonerythemaous.  Neurologic: CN II-XII grossly intact. Moves all 4 extremities spontaneously.    Lab Results: Basic Metabolic Panel:  Recent Labs  16/10/96 2111 05/21/12 0500  NA 130* 132*  K 4.0 3.9  CL 99 102  CO2 25 24  GLUCOSE 112* 110*  BUN 9 8  CREATININE 0.72 0.68  CALCIUM 7.8* 7.9*  MG 2.1  --   PHOS 3.0  --     Liver Function Tests:  Recent Labs  05/20/12 2111  AST 106*  ALT 75*  ALKPHOS 119*  BILITOT 0.6  PROT 7.3  ALBUMIN 2.3*   No results found for this basename: LIPASE, AMYLASE,  in the last 72 hours No results found for this basename: AMMONIA,  in the last 72 hours CBC:  Recent Labs  05/20/12 2111 05/21/12 0500  WBC 1.5* 1.4*  NEUTROABS 1.0*  --   HGB 8.9* 8.1*  HCT 26.4* 24.1*  MCV 75.6* 76.3*  PLT 56* 52*   Cardiac Enzymes: No results found for this basename: CKTOTAL, CKMB, CKMBINDEX, TROPONINI,  in the last 72 hours BNP: No results found for this basename: PROBNP,  in the last 72 hours D-Dimer: No results found for this basename: DDIMER,  in the last 72 hours CBG: No results found for this basename: GLUCAP,  in the last 72 hours Hemoglobin A1C: No results found for this basename: HGBA1C,  in the last 72 hours Fasting Lipid Panel: No results found for this basename: CHOL, HDL, LDLCALC, TRIG, CHOLHDL, LDLDIRECT,  in the last 72 hours Thyroid Function Tests: No results found for this basename: TSH, T4TOTAL, FREET4, T3FREE, THYROIDAB,  in the last 72 hours Anemia Panel: No results found for this basename: VITAMINB12, FOLATE, FERRITIN,  TIBC, IRON, RETICCTPCT,  in the last 72 hours Coagulation:  Recent Labs  05/20/12 2111  LABPROT 15.9*  INR 1.30   Urine Drug Screen: Drugs of Abuse     Component Value Date/Time   LABOPIA NONE DETECTED 05/15/2012 1054   COCAINSCRNUR NONE DETECTED 05/15/2012 1054   LABBENZ NONE DETECTED 05/15/2012 1054   AMPHETMU NONE DETECTED 05/15/2012 1054   THCU NONE DETECTED 05/15/2012 1054   LABBARB NONE DETECTED 05/15/2012 1054    Alcohol Level: No results found for this basename: ETH,  in the last 72 hours Urinalysis: No results found for this basename: COLORURINE, APPERANCEUR, LABSPEC, PHURINE, GLUCOSEU, HGBUR, BILIRUBINUR, KETONESUR, PROTEINUR, UROBILINOGEN, NITRITE, LEUKOCYTESUR,  in the last 72 hours   Micro Results: Recent  Results (from the past 240 hour(s))  URINE CULTURE     Status: None   Collection Time    05/12/12  8:48 AM      Result Value Range Status   Specimen Description URINE, RANDOM   Final   Special Requests NONE   Final   Culture  Setup Time 05/12/2012 19:58   Final   Colony Count NO GROWTH   Final   Culture NO GROWTH   Final   Report Status 05/13/2012 FINAL   Final  CULTURE, BLOOD (ROUTINE X 2)     Status: None   Collection Time    05/12/12  9:23 AM      Result Value Range Status   Specimen Description BLOOD RIGHT HAND   Final   Special Requests BOTTLES DRAWN AEROBIC ONLY 8CC   Final   Culture  Setup Time 05/12/2012 18:46   Final   Culture NO GROWTH 5 DAYS   Final   Report Status 05/18/2012 FINAL   Final  CULTURE, BLOOD (ROUTINE X 2)     Status: None   Collection Time    05/12/12  9:25 AM      Result Value Range Status   Specimen Description BLOOD RIGHT ARM   Final   Special Requests BOTTLES DRAWN AEROBIC ONLY 10CC   Final   Culture  Setup Time 05/12/2012 18:46   Final   Culture NO GROWTH 5 DAYS   Final   Report Status 05/18/2012 FINAL   Final  AFB CULTURE, BLOOD     Status: None   Collection Time    05/12/12  2:03 PM      Result Value Range Status   Specimen Description BLOOD RIGHT ARM   Final   Special Requests BOTTLES DRAWN AEROBIC ONLY 5CC   Final   Culture     Final   Value: CULTURE WILL BE EXAMINED FOR 6 WEEKS BEFORE ISSUING A FINAL REPORT   Report Status PENDING   Incomplete  CULTURE, BLOOD (ROUTINE X 2)     Status: None   Collection Time    05/15/12  5:35 AM      Result Value Range Status   Specimen Description BLOOD LEFT ARM   Final   Special Requests BOTTLES DRAWN AEROBIC ONLY 10CC   Final   Culture  Setup Time 05/15/2012 09:09   Final   Culture NO GROWTH 5 DAYS   Final   Report Status 05/21/2012 FINAL   Final  CULTURE, BLOOD (ROUTINE X 2)     Status: None   Collection Time    05/15/12  5:40 AM      Result Value Range Status   Specimen Description BLOOD LEFT  HAND   Final   Special Requests BOTTLES  DRAWN AEROBIC ONLY 10CC   Final   Culture  Setup Time 05/15/2012 09:09   Final   Culture NO GROWTH 5 DAYS   Final   Report Status 05/21/2012 FINAL   Final  FUNGUS CULTURE, BLOOD     Status: None   Collection Time    05/15/12  5:40 AM      Result Value Range Status   Specimen Description BLOOD LEFT HAND   Final   Special Requests BOTTLES DRAWN AEROBIC ONLY 10CC   Final   Culture NO FUNGUS ISOLATED;CULTURE IN PROGRESS FOR 7 DAYS   Final   Report Status PENDING   Incomplete  URINE CULTURE     Status: None   Collection Time    05/15/12  6:40 AM      Result Value Range Status   Specimen Description URINE, RANDOM   Final   Special Requests NONE   Final   Culture  Setup Time 05/15/2012 12:50   Final   Colony Count NO GROWTH   Final   Culture NO GROWTH   Final   Report Status 05/16/2012 FINAL   Final  GC/CHLAMYDIA PROBE AMP     Status: None   Collection Time    05/15/12  6:40 AM      Result Value Range Status   CT Probe RNA NEGATIVE  NEGATIVE Final   GC Probe RNA NEGATIVE  NEGATIVE Final   Comment: (NOTE)                                                                                             *Normal Reference Range: Negative*          Assay performed using the Gen-Probe APTIMA COMBO2 (R) Assay.     Acceptable specimen types for this assay include APTIMA Swabs (Unisex,     endocervical, urethral, or vaginal), first void urine, and ThinPrep     liquid based cytology samples.  RESPIRATORY VIRUS PANEL     Status: None   Collection Time    05/15/12 10:40 AM      Result Value Range Status   Source - RVPAN NASAL SWAB   Corrected   Comment: CORRECTED ON 05/15 AT 2024: PREVIOUSLY REPORTED AS NASAL SWAB   Respiratory Syncytial Virus A NOT DETECTED   Final   Respiratory Syncytial Virus B NOT DETECTED   Final   Influenza A NOT DETECTED   Final   Influenza B NOT DETECTED   Final   Parainfluenza 1 NOT DETECTED   Final   Parainfluenza 2 NOT DETECTED    Final   Parainfluenza 3 NOT DETECTED   Final   Metapneumovirus NOT DETECTED   Final   Rhinovirus NOT DETECTED   Final   Adenovirus NOT DETECTED   Final   Influenza A H1 NOT DETECTED   Final   Influenza A H3 NOT DETECTED   Final   Comment: (NOTE)           Normal Reference Range for each Analyte: NOT DETECTED     Testing performed using the Luminex xTAG Respiratory Viral Panel test     kit.  This test was developed and its performance characteristics determined     by [employer]. It has not been cleared or approved by the [place]     Food and Drug Administration. This test is used for clinical purposes.     It should not be regarded as investigational or for research. This     laboratory is certified under the Clinical Laboratory Improvement     Amendments of 1988 (CLIA) as qualified to perform high complexity     clinical laboratory testing.  MRSA PCR SCREENING     Status: None   Collection Time    05/15/12 10:40 AM      Result Value Range Status   MRSA by PCR NEGATIVE  NEGATIVE Final   Comment:            The GeneXpert MRSA Assay (FDA     approved for NASAL specimens     only), is one component of a     comprehensive MRSA colonization     surveillance program. It is not     intended to diagnose MRSA     infection nor to guide or     monitor treatment for     MRSA infections.  AFB CULTURE, BLOOD     Status: None   Collection Time    05/16/12 12:20 AM      Result Value Range Status   Specimen Description BLOOD LEFT ARM   Final   Special Requests 5CC   Final   Culture     Final   Value: CULTURE WILL BE EXAMINED FOR 6 WEEKS BEFORE ISSUING A FINAL REPORT   Report Status PENDING   Incomplete  AFB CULTURE, BLOOD     Status: None   Collection Time    05/17/12 10:01 AM      Result Value Range Status   Specimen Description BONE MARROW   Final   Special Requests AFB ONLY/2CC   Final   Culture     Final   Value: CULTURE WILL BE EXAMINED FOR 6 WEEKS BEFORE ISSUING A  FINAL REPORT   Report Status PENDING   Incomplete  CULTURE, BLOOD (ROUTINE X 2)     Status: None   Collection Time    05/17/12 10:01 AM      Result Value Range Status   Specimen Description BONE MARROW   Final   Special Requests BOTTLES DRAWN AEROBIC ONLY 2CC   Final   Culture  Setup Time 05/18/2012 08:37   Final   Culture     Final   Value:        BLOOD CULTURE RECEIVED NO GROWTH TO DATE CULTURE WILL BE HELD FOR 5 DAYS BEFORE ISSUING A FINAL NEGATIVE REPORT   Report Status PENDING   Incomplete  FUNGUS CULTURE, BLOOD     Status: None   Collection Time    05/17/12 10:01 AM      Result Value Range Status   Specimen Description BONE MARROW   Final   Special Requests BOTTLES DRAWN AEROBIC ONLY 2CC   Final   Culture NO FUNGUS ISOLATED;CULTURE IN PROGRESS FOR 7 DAYS   Final   Report Status PENDING   Incomplete    Studies/Results: No results found.  Medications: I have reviewed the patient's current medications. Scheduled Meds: . [START ON 05/27/2012] azithromycin  1,200 mg Oral Weekly  . pneumococcal 23 valent vaccine  0.5 mL Intramuscular Tomorrow-1000  . sodium chloride  1,000 mL Intravenous Once  . sulfamethoxazole-trimethoprim  1 tablet Oral Daily   Continuous Infusions: . sodium chloride 150 mL/hr at 05/21/12 0515   PRN Meds:.acetaminophen, acetaminophen   Assessment/Plan: 32 yo M with AIDS and fever of unknown origin.   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 -Tmax overnight 39.4, currently 37.  -Crypto antigen neg; Resp panel neg; GC/C neg; RPR neg; RMS fever neg; hep panel neg; ehrlichia panel neg; quantiferon gold neg; CMV PCR wnl. -Histoplasma antigen pending.  -AFB, fungus blood cultures  pending.  -Blood cultures negative x 2, urine cultures negative x 2. -LP today.  Will send CSF for (1) histoplasma antigen testing [facility]) and (2) fungal culture.  -Hand and thigh skin biopsies today x 2 each. Will send one of each for (1) pathology and (2) fungal stain & culture.  -APAP 325 mg prn fever.   AIDS  -HIV genotype pending. -Infectious disease following. Appreciate recs.  -Initiate empiric treatment for disseminated histo.   -Hold off on MAC treatment at this time. -Defer restarting ART at this time.  -Azithromycin 1.2 g for MAC prophylaxis. PCP prophylaxis with bactrim 160 mg daily.  -Administer pneumococcal 23 valent vaccine.   Hypotension -Likely related to volume depletion as BP improved with IVF.  Baseline ~100/50s. -S/p 2 bolus of NS.  NS @ 150 cc/hr.  -Monitor per unit protocol.  Hypoalbuminemia  -Likely 2/2 chronic disease.  -Regular diet. Encourage PO intake.   Prophylaxis: SCDs   Access: R AC PIV (NS 5/19)  Code: full   Dispo: floor status  -Anticipated date of discharge unknown.   This is a Psychologist, occupational Note.  The care of the patient was discussed with Dr. Dorma Russell and the assessment and plan formulated with their assistance.  Please see their attached note for official documentation of the daily encounter.  Harland Dingwall Barstowlow 05/21/2012, 9:35 AM

## 2012-05-21 NOTE — Care Management Note (Signed)
    Page 1 of 2   05/24/2012     7:42:43 AM   CARE MANAGEMENT NOTE 05/24/2012  Patient:  David Knapp,David Knapp   Account Number:  [id number]  Date Initiated:  05/21/2012  Documentation initiated by:  Letha Cape  Subjective/Objective Assessment:   dx fever , 042  admit- lives with cousin.     Action/Plan:   will need   Anticipated DC Date:  05/22/2012   Anticipated DC Plan:  HOME W HOME HEALTH SERVICES      DC Planning Services  MATCH Program  CM consult      [facility] Choice  HOME HEALTH   Choice offered to / List presented to:  C-1 Patient        HH arranged  HH-1 RN      [facility] agency  [facility]   Status of service:  Completed, signed off Medicare Important Message given?   (If response is "NO", the following Medicare IM given date fields will be blank) Date Medicare IM given:   Date Additional Medicare IM given:    Discharge Disposition:  HOME W HOME HEALTH SERVICES  Per UR Regulation:  Reviewed for med. 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  05/21/12 10:52 Letha Cape RN, BSN [phone] patient lives with spouse, spoke with MD she states patient told her he had to pay $600 for his meds.  I called the outpt pharmacy and they stated his meds are $11.81 and they are still at the outpt pharmacy waiting for him to pick them up, he never picked them up.  I called the interpreter line to see if I could get an interpreter , Darrelyn Hillock will be coming up , and I will call Md when she comes up so patient can sign consent as well for lumbar puncture. Paient states he went to the pharmacy but it was the wrong pharmacy.  Interpreter was trying to explain to him how to get to the outpt  pharmacy, may need security to walk him over or a family member at dc to pick up meds.   Patient will need 2 weeks of IV ABX, patient ok with [facility] , Referral made to [facility], Lupita Leash notified.  The picc line will be put in tomorrow.  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

## 2012-05-21 NOTE — Progress Notes (Signed)
INFECTIOUS DISEASE PROGRESS NOTE  ID: David Knapp is a 32 y.o. male with   Principal Problem:   Fever Active Problems:   HIV DISEASE   WARTS, BOTH HANDS   HISTOPLASMOSIS, DISSEMINATED   Pancytopenia   Orthostatic hypotension  Subjective: 32 yo M with hx of AIDS, histo (treated 2008, proven by bal) and non-compliance. He moved to [place] in 2012 and has been off ART since then. He returned to [facility] 5-14 with fever and pancytopenia. He underwent bone marrow Bx and was started on empiric MAI therapy . He was d/c home on 5-16, however he was not able to obtain his medications due to finances He returned to ID clinic on 5-19 and was seen by Dr Daiva Eves with fever, night sweats, chills, fatigue and dizziness. Dr Daiva Eves astutely suggested that this could be recurrence of his histo and sent him to the hospital . He had temp 102.2 and BP 95/54.  He has been hydrated in hospital, still somewhat hypotensive.  Denies dysphagia, no headaches.   HIV 1 RNA Quant (copies/mL)  Date Value  05/12/2012 62399*  07/18/2010 <20   04/08/2009 <48 copies/mL      CD4 T Cell Abs (cmm)  Date Value  05/15/2012 <10*  05/12/2012 <10*  07/18/2010 310*     Abtx:  Anti-infectives   Start     Dose/Rate Route Frequency Ordered Stop   05/27/12 2100  azithromycin (ZITHROMAX) tablet 1,250 mg  Status:  Discontinued     1,250 mg Oral Weekly 05/20/12 2105 05/20/12 2107   05/27/12 1000  azithromycin (ZITHROMAX) tablet 1,200 mg     1,200 mg Oral Weekly 05/20/12 2108     05/20/12 2100  sulfamethoxazole-trimethoprim (BACTRIM DS) 800-160 MG per tablet 1 tablet     1 tablet Oral Daily 05/20/12 2048     05/20/12 2100  azithromycin (ZITHROMAX) tablet 1,250 mg  Status:  Discontinued     1,250 mg Oral Daily 05/20/12 2048 05/20/12 2105      Medications:  Scheduled: . [START ON 05/27/2012] azithromycin  1,200 mg Oral Weekly  . pneumococcal 23 valent vaccine  0.5 mL Intramuscular Tomorrow-1000  .  sulfamethoxazole-trimethoprim  1 tablet Oral Daily   FHx/SocHx reviewed.   Objective: Vital signs in last 24 hours: Temp:  [98.6 F (37 C)-103 F (39.4 C)] 98.6 F (37 C) (05/20 0729) Pulse Rate:  [88-123] 88 (05/20 0729) Resp:  [18-20] 20 (05/20 0541) BP: (79-104)/(40-68) 100/56 mmHg (05/20 0729) SpO2:  [96 %-99 %] 96 % (05/19 2058) Weight:  [71 kg (156 lb 8.4 oz)-72.122 kg (159 lb)] 71 kg (156 lb 8.4 oz) (05/20 0516)   General appearance: alert, cooperative and no distress Throat: normal findings: oropharynx pink & moist without lesions or evidence of thrush and no ulcers Neck: no adenopathy, supple, symmetrical, trachea midline and no menigismus, FROM Resp: clear to auscultation bilaterally Cardio: regular rate and rhythm GI: normal findings: bowel sounds normal and soft, non-tender Skin: warts on hands  Lab Results  Recent Labs  05/20/12 2111 05/21/12 0500  WBC 1.5* 1.4*  HGB 8.9* 8.1*  HCT 26.4* 24.1*  NA 130* 132*  K 4.0 3.9  CL 99 102  CO2 25 24  BUN 9 8  CREATININE 0.72 0.68   Liver Panel  Recent Labs  05/20/12 2111  PROT 7.3  ALBUMIN 2.3*  AST 106*  ALT 75*  ALKPHOS 119*  BILITOT 0.6   Sedimentation Rate No results found for this basename: ESRSEDRATE,  in the last 72 hours C-Reactive Protein No results found for this basename: CRP,  in the last 72 hours  Microbiology: Recent Results (from the past 240 hour(s))  URINE CULTURE     Status: None   Collection Time    05/12/12  8:48 AM      Result Value Range Status   Specimen Description URINE, RANDOM   Final   Special Requests NONE   Final   Culture  Setup Time 05/12/2012 19:58   Final   Colony Count NO GROWTH   Final   Culture NO GROWTH   Final   Report Status 05/13/2012 FINAL   Final  CULTURE, BLOOD (ROUTINE X 2)     Status: None   Collection Time    05/12/12  9:23 AM      Result Value Range Status   Specimen Description BLOOD RIGHT HAND   Final   Special Requests BOTTLES DRAWN AEROBIC  ONLY 8CC   Final   Culture  Setup Time 05/12/2012 18:46   Final   Culture NO GROWTH 5 DAYS   Final   Report Status 05/18/2012 FINAL   Final  CULTURE, BLOOD (ROUTINE X 2)     Status: None   Collection Time    05/12/12  9:25 AM      Result Value Range Status   Specimen Description BLOOD RIGHT ARM   Final   Special Requests BOTTLES DRAWN AEROBIC ONLY 10CC   Final   Culture  Setup Time 05/12/2012 18:46   Final   Culture NO GROWTH 5 DAYS   Final   Report Status 05/18/2012 FINAL   Final  AFB CULTURE, BLOOD     Status: None   Collection Time    05/12/12  2:03 PM      Result Value Range Status   Specimen Description BLOOD RIGHT ARM   Final   Special Requests BOTTLES DRAWN AEROBIC ONLY 5CC   Final   Culture     Final   Value: CULTURE WILL BE EXAMINED FOR 6 WEEKS BEFORE ISSUING A FINAL REPORT   Report Status PENDING   Incomplete  CULTURE, BLOOD (ROUTINE X 2)     Status: None   Collection Time    05/15/12  5:35 AM      Result Value Range Status   Specimen Description BLOOD LEFT ARM   Final   Special Requests BOTTLES DRAWN AEROBIC ONLY 10CC   Final   Culture  Setup Time 05/15/2012 09:09   Final   Culture NO GROWTH 5 DAYS   Final   Report Status 05/21/2012 FINAL   Final  CULTURE, BLOOD (ROUTINE X 2)     Status: None   Collection Time    05/15/12  5:40 AM      Result Value Range Status   Specimen Description BLOOD LEFT HAND   Final   Special Requests BOTTLES DRAWN AEROBIC ONLY 10CC   Final   Culture  Setup Time 05/15/2012 09:09   Final   Culture NO GROWTH 5 DAYS   Final   Report Status 05/21/2012 FINAL   Final  FUNGUS CULTURE, BLOOD     Status: None   Collection Time    05/15/12  5:40 AM      Result Value Range Status   Specimen Description BLOOD LEFT HAND   Final   Special Requests BOTTLES DRAWN AEROBIC ONLY 10CC   Final   Culture NO FUNGUS ISOLATED;CULTURE IN PROGRESS FOR 7 DAYS   Final  Report Status PENDING   Incomplete  URINE CULTURE     Status: None   Collection Time     05/15/12  6:40 AM      Result Value Range Status   Specimen Description URINE, RANDOM   Final   Special Requests NONE   Final   Culture  Setup Time 05/15/2012 12:50   Final   Colony Count NO GROWTH   Final   Culture NO GROWTH   Final   Report Status 05/16/2012 FINAL   Final  GC/CHLAMYDIA PROBE AMP     Status: None   Collection Time    05/15/12  6:40 AM      Result Value Range Status   CT Probe RNA NEGATIVE  NEGATIVE Final   GC Probe RNA NEGATIVE  NEGATIVE Final   Comment: (NOTE)                                                                                              Normal Reference Range: Negative          Assay performed using the Gen-Probe APTIMA COMBO2 (R) Assay.     Acceptable specimen types for this assay include APTIMA Swabs (Unisex,     endocervical, urethral, or vaginal), first void urine, and ThinPrep     liquid based cytology samples.  RESPIRATORY VIRUS PANEL     Status: None   Collection Time    05/15/12 10:40 AM      Result Value Range Status   Source - RVPAN NASAL SWAB   Corrected   Comment: CORRECTED ON 05/15 AT 2024: PREVIOUSLY REPORTED AS NASAL SWAB   Respiratory Syncytial Virus A NOT DETECTED   Final   Respiratory Syncytial Virus B NOT DETECTED   Final   Influenza A NOT DETECTED   Final   Influenza B NOT DETECTED   Final   Parainfluenza 1 NOT DETECTED   Final   Parainfluenza 2 NOT DETECTED   Final   Parainfluenza 3 NOT DETECTED   Final   Metapneumovirus NOT DETECTED   Final   Rhinovirus NOT DETECTED   Final   Adenovirus NOT DETECTED   Final   Influenza A H1 NOT DETECTED   Final   Influenza A H3 NOT DETECTED   Final   Comment: (NOTE)           Normal Reference Range for each Analyte: NOT DETECTED     Testing performed using the Luminex xTAG Respiratory Viral Panel test     kit.     This test was developed and its performance characteristics determined     by [employer]. It has not been cleared or approved by the [place]     Food and Drug  Administration. This test is used for clinical purposes.     It should not be regarded as investigational or for research. This     laboratory is certified under the Clinical Laboratory Improvement     Amendments of 1988 (CLIA) as qualified to perform high complexity     clinical laboratory testing.  MRSA PCR SCREENING     Status:  None   Collection Time    05/15/12 10:40 AM      Result Value Range Status   MRSA by PCR NEGATIVE  NEGATIVE Final   Comment:            The GeneXpert MRSA Assay (FDA     approved for NASAL specimens     only), is one component of a     comprehensive MRSA colonization     surveillance program. It is not     intended to diagnose MRSA     infection nor to guide or     monitor treatment for     MRSA infections.  AFB CULTURE, BLOOD     Status: None   Collection Time    05/16/12 12:20 AM      Result Value Range Status   Specimen Description BLOOD LEFT ARM   Final   Special Requests 5CC   Final   Culture     Final   Value: CULTURE WILL BE EXAMINED FOR 6 WEEKS BEFORE ISSUING A FINAL REPORT   Report Status PENDING   Incomplete  AFB CULTURE, BLOOD     Status: None   Collection Time    05/17/12 10:01 AM      Result Value Range Status   Specimen Description BONE MARROW   Final   Special Requests AFB ONLY/2CC   Final   Culture     Final   Value: CULTURE WILL BE EXAMINED FOR 6 WEEKS BEFORE ISSUING A FINAL REPORT   Report Status PENDING   Incomplete  CULTURE, BLOOD (ROUTINE X 2)     Status: None   Collection Time    05/17/12 10:01 AM      Result Value Range Status   Specimen Description BONE MARROW   Final   Special Requests BOTTLES DRAWN AEROBIC ONLY 2CC   Final   Culture  Setup Time 05/18/2012 08:37   Final   Culture     Final   Value:        BLOOD CULTURE RECEIVED NO GROWTH TO DATE CULTURE WILL BE HELD FOR 5 DAYS BEFORE ISSUING A FINAL NEGATIVE REPORT   Report Status PENDING   Incomplete  FUNGUS CULTURE, BLOOD     Status: None   Collection Time     05/17/12 10:01 AM      Result Value Range Status   Specimen Description BONE MARROW   Final   Special Requests BOTTLES DRAWN AEROBIC ONLY 2CC   Final   Culture NO FUNGUS ISOLATED;CULTURE IN PROGRESS FOR 7 DAYS   Final   Report Status PENDING   Incomplete    Studies/Results: No results found.   Assessment/Plan: AIDS Histoplasmosis  Total days of antibiotics:   His patho shows histo.  Will start him on amphotericin/abelcet Hold ART for 2 weeks (previously on ATVr/TRV) My great appreciation to IM team (he will get LP today). Could defer his skin Bx as we have dx.  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   Johny Sax Infectious Diseases [phone] [email or web address] 05/21/2012, 10:19 AM   LOS: 1 day

## 2012-05-21 NOTE — Progress Notes (Signed)
INITIAL NUTRITION ASSESSMENT  DOCUMENTATION CODES Per approved criteria  -Not Applicable   INTERVENTION: 1. Ensure Complete po TID, each supplement provides 350 kcal and 13 grams of protein.   NUTRITION DIAGNOSIS: Increased nutrient needs related to fevers as evidenced by weight loss.   Goal: Po intake to meet >/=90% estimated nutrition needs.   Monitor:  PO intake, weight trends, labs  Reason for Assessment: Malnutrition Screening Tool  32 y.o. male  Admitting Dx: Fever  ASSESSMENT: Pt with hx of AIDS and disseminated histoplasmosis with non-compliance. [facility], last d/c'd on 5/16 but did not fill Rx. Presented back to ID clinic with subjective fevers, night sweats, chills, fatigue and dizziness. Admitted for possible reoccurrence of histoplasmosis.  Weight hx is limited, no weights on file for the past year. Pt has lost ~ 64 lbs since 07/2010.  Pt does not speak English, in person interpretor used. Pt reports he usually weighs about 180 lbs, but has been losing weight for about 4 months. 24 lb weight loss in 4 months, (13% body weight) severe weight loss.  States he eats 3 large meals daily. Ate 100% of meals last night.  Expect pt has increased needs given ongoing fevers and chronic disease state. Pt is agreeable to nutrition supplements.   Height: Ht Readings from Last 1 Encounters:  05/21/12 5\' 7"  (1.702 m)    Weight: Wt Readings from Last 1 Encounters:  05/21/12 156 lb 8.4 oz (71 kg)    Ideal Body Weight: 148 lbs   % Ideal Body Weight: 105%  Wt Readings from Last 10 Encounters:  05/21/12 156 lb 8.4 oz (71 kg)  05/20/12 159 lb (72.122 kg)  05/15/12 220 lb 0.3 oz (99.8 kg)  08/01/10 220 lb (99.791 kg)  04/27/09 224 lb 12.8 oz (101.969 kg)  01/08/09 220 lb (99.791 kg)  09/18/08 211 lb 9.6 oz (95.981 kg)  06/05/08 204 lb 12.8 oz (92.897 kg)  02/21/08 199 lb 1.6 oz (90.311 kg)  11/15/07 193 lb (87.544 kg)    Usual Body Weight: 180 lbs   %  Usual Body Weight: 86%  BMI:  Body mass index is 24.51 kg/(m^2). WNL   Estimated Nutritional Needs: Kcal: 2000-2200 Protein: 70-80 gm  Fluid: 2-2.2 L   Skin: incision, lower back  Diet Order: General  EDUCATION NEEDS: -No education needs identified at this time   Intake/Output Summary (Last 24 hours) at 05/21/12 1139 Last data filed at 05/21/12 1006  Gross per 24 hour  Intake   1125 ml  Output   1550 ml  Net   -425 ml    Last BM: PTA   Labs:   Recent Labs Lab 05/17/12 1022 05/20/12 2111 05/21/12 0500  NA 129* 130* 132*  K 4.1 4.0 3.9  CL 99 99 102  CO2 24 25 24   BUN 11 9 8   CREATININE 1.02 0.72 0.68  CALCIUM 8.2* 7.8* 7.9*  MG  --  2.1  --   PHOS  --  3.0  --   GLUCOSE 95 112* 110*    CBG (last 3)  No results found for this basename: GLUCAP,  in the last 72 hours  Temp Readings from Last 3 Encounters:  05/21/12 98.6 F (37 C) Oral  05/20/12 103 F (39.4 C) Oral  05/17/12 99.2 F (37.3 C)     Scheduled Meds: . amphotericin-B lipid  5 mg/kg Intravenous Q24H  . [START ON 05/27/2012] azithromycin  1,200 mg Oral Weekly  . pneumococcal 23 valent  vaccine  0.5 mL Intramuscular Tomorrow-1000  . sulfamethoxazole-trimethoprim  1 tablet Oral Daily    Continuous Infusions: . sodium chloride 150 mL/hr at 05/21/12 9604    Past Medical History  Diagnosis Date  . HIV (human immunodeficiency virus infection)   . 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 Possible recurrence 2014 (currently being evaluated 05/2012)  . Pancytopenia     Past Surgical History  Procedure Laterality Date  . No past surgeries      Clarene Duke RD, LDN Pager [phone] After Hours pager [phone]

## 2012-05-22 ENCOUNTER — Telehealth (HOSPITAL_COMMUNITY): Payer: Self-pay | Admitting: *Deleted

## 2012-05-22 LAB — BASIC METABOLIC PANEL
Chloride: 102 mEq/L (ref 96–112)
Creatinine, Ser: 0.88 mg/dL (ref 0.50–1.35)
GFR calc Af Amer: >90 mL/min (ref >90)
Sodium: 132 mEq/L — ABNORMAL LOW (ref 135–145)

## 2012-05-22 LAB — CBC
HCT: 23.9 % — ABNORMAL LOW (ref 39.0–52.0)
Platelets: 46 10*3/uL — ABNORMAL LOW (ref 150–400)
RDW: 14.2 % (ref 11.5–15.5)
WBC: 1.1 10*3/uL — CL (ref 4.0–10.5)

## 2012-05-22 LAB — FUNGUS CULTURE, BLOOD

## 2012-05-22 LAB — HIV-1 GENOTYPR PLUS

## 2012-05-22 MED ORDER — SODIUM CHLORIDE 0.9 % IV BOLUS (SEPSIS)
1000.0000 mL | Freq: Once | INTRAVENOUS | Status: AC
  Administered 2012-05-22: 1000 mL via INTRAVENOUS

## 2012-05-22 MED ORDER — SODIUM CHLORIDE 0.9 % IJ SOLN
10.0000 mL | INTRAMUSCULAR | Status: DC | PRN
  Administered 2012-05-23: 10 mL

## 2012-05-22 NOTE — Progress Notes (Signed)
Peripherally Inserted Central Catheter/Midline Placement  The IV Nurse has discussed with the patient and/or persons authorized to consent for the patient, the purpose of this procedure and the potential benefits and risks involved with this procedure.  The benefits include less needle sticks, lab draws from the catheter and patient may be discharged home with the catheter.  Risks include, but not limited to, infection, bleeding, blood clot (thrombus formation), and puncture of an artery; nerve damage and irregular heat beat.  Alternatives to this procedure were also discussed.  PICC/Midline Placement Documentation        Lisabeth Devoid 05/22/2012, 5:08 PM Consent obtained by Stacie Glaze, RN using Spanish interpreter via phone interpreting service.

## 2012-05-22 NOTE — Progress Notes (Signed)
Subjective: Denies any pain, endorses fevers and chills.   Objective: Vital signs in last 24 hours: Filed Vitals:   05/21/12 2055 05/22/12 0516 05/22/12 0517 05/22/12 0518  BP: 102/48 85/47 91/49  75/37  Pulse: 115 101 102 107  Temp: 99.1 F (37.3 C) 100.9 F (38.3 C)    TempSrc: Oral Oral    Resp: 28     Height:      Weight:      SpO2: 93% 99%     Weight change:   Intake/Output Summary (Last 24 hours) at 05/22/12 0846 Last data filed at 05/22/12 0616  Gross per 24 hour  Intake   1400 ml  Output   3500 ml  Net  -2100 ml   Vitals reviewed. General: resting in bed, NAD HEENT: PERRL, EOMI, no scleral icterus Cardiac: RRR, no rubs, murmurs or gallops Pulm: clear to auscultation bilaterally, no wheezes, rales, or rhonchi Abd: soft, nontender, nondistended, BS present Ext: Wart-like lesions on bilateral hands and psoriatic-like plaques on BLE. Warm and well perfused, no pedal edema Neuro: alert and oriented X3, nonfocal  Lab Results: Basic Metabolic Panel:  Recent Labs Lab 05/20/12 2111 05/21/12 0500 05/22/12 0520  NA 130* 132* 132*  K 4.0 3.9 4.0  CL 99 102 102  CO2 25 24 20   GLUCOSE 112* 110* 101*  BUN 9 8 9   CREATININE 0.72 0.68 0.88  CALCIUM 7.8* 7.9* 7.6*  MG 2.1  --   --   PHOS 3.0  --   --    Liver Function Tests:  Recent Labs Lab 05/17/12 1022 05/20/12 2111  AST 99* 106*  ALT 67* 75*  ALKPHOS 134* 119*  BILITOT 0.6 0.6  PROT 7.6 7.3  ALBUMIN 2.5* 2.3*   CBC:  Recent Labs Lab 05/20/12 2111 05/21/12 0500 05/22/12 0520  WBC 1.5* 1.4* 1.1*  NEUTROABS 1.0*  --   --   HGB 8.9* 8.1* 8.0*  HCT 26.4* 24.1* 23.9*  MCV 75.6* 76.3* 76.1*  PLT 56* 52* 46*    Coagulation:  Recent Labs Lab 05/20/12 2111  LABPROT 15.9*  INR 1.30   Urine Drug Screen: Drugs of Abuse     Component Value Date/Time   LABOPIA NONE DETECTED 05/15/2012 1054   COCAINSCRNUR NONE DETECTED 05/15/2012 1054   LABBENZ NONE DETECTED 05/15/2012 1054   AMPHETMU NONE  DETECTED 05/15/2012 1054   THCU NONE DETECTED 05/15/2012 1054   LABBARB NONE DETECTED 05/15/2012 1054    Urinalysis: No results found for this basename: COLORURINE, APPERANCEUR, LABSPEC, PHURINE, GLUCOSEU, HGBUR, BILIRUBINUR, KETONESUR, PROTEINUR, UROBILINOGEN, NITRITE, LEUKOCYTESUR,  in the last 168 hours  Micro Results: Recent Results (from the past 240 hour(s))  URINE CULTURE     Status: None   Collection Time    05/12/12  8:48 AM      Result Value Range Status   Specimen Description URINE, RANDOM   Final   Special Requests NONE   Final   Culture  Setup Time 05/12/2012 19:58   Final   Colony Count NO GROWTH   Final   Culture NO GROWTH   Final   Report Status 05/13/2012 FINAL   Final  CULTURE, BLOOD (ROUTINE X 2)     Status: None   Collection Time    05/12/12  9:23 AM      Result Value Range Status   Specimen Description BLOOD RIGHT HAND   Final   Special Requests BOTTLES DRAWN AEROBIC ONLY 8CC   Final   Culture  Setup Time  05/12/2012 18:46   Final   Culture NO GROWTH 5 DAYS   Final   Report Status 05/18/2012 FINAL   Final  CULTURE, BLOOD (ROUTINE X 2)     Status: None   Collection Time    05/12/12  9:25 AM      Result Value Range Status   Specimen Description BLOOD RIGHT ARM   Final   Special Requests BOTTLES DRAWN AEROBIC ONLY 10CC   Final   Culture  Setup Time 05/12/2012 18:46   Final   Culture NO GROWTH 5 DAYS   Final   Report Status 05/18/2012 FINAL   Final  AFB CULTURE, BLOOD     Status: None   Collection Time    05/12/12  2:03 PM      Result Value Range Status   Specimen Description BLOOD RIGHT ARM   Final   Special Requests BOTTLES DRAWN AEROBIC ONLY 5CC   Final   Culture     Final   Value: CULTURE WILL BE EXAMINED FOR 6 WEEKS BEFORE ISSUING A FINAL REPORT   Report Status PENDING   Incomplete  CULTURE, BLOOD (ROUTINE X 2)     Status: None   Collection Time    05/15/12  5:35 AM      Result Value Range Status   Specimen Description BLOOD LEFT ARM   Final    Special Requests BOTTLES DRAWN AEROBIC ONLY 10CC   Final   Culture  Setup Time 05/15/2012 09:09   Final   Culture NO GROWTH 5 DAYS   Final   Report Status 05/21/2012 FINAL   Final  CULTURE, BLOOD (ROUTINE X 2)     Status: None   Collection Time    05/15/12  5:40 AM      Result Value Range Status   Specimen Description BLOOD LEFT HAND   Final   Special Requests BOTTLES DRAWN AEROBIC ONLY 10CC   Final   Culture  Setup Time 05/15/2012 09:09   Final   Culture NO GROWTH 5 DAYS   Final   Report Status 05/21/2012 FINAL   Final  FUNGUS CULTURE, BLOOD     Status: None   Collection Time    05/15/12  5:40 AM      Result Value Range Status   Specimen Description BLOOD LEFT HAND   Final   Special Requests BOTTLES DRAWN AEROBIC ONLY 10CC   Final   Culture NO FUNGUS ISOLATED;CULTURE IN PROGRESS FOR 7 DAYS   Final   Report Status PENDING   Incomplete  URINE CULTURE     Status: None   Collection Time    05/15/12  6:40 AM      Result Value Range Status   Specimen Description URINE, RANDOM   Final   Special Requests NONE   Final   Culture  Setup Time 05/15/2012 12:50   Final   Colony Count NO GROWTH   Final   Culture NO GROWTH   Final   Report Status 05/16/2012 FINAL   Final  GC/CHLAMYDIA PROBE AMP     Status: None   Collection Time    05/15/12  6:40 AM      Result Value Range Status   CT Probe RNA NEGATIVE  NEGATIVE Final   GC Probe RNA NEGATIVE  NEGATIVE Final   Comment: (NOTE)  Normal Reference Range: Negative          Assay performed using the Gen-Probe APTIMA COMBO2 (R) Assay.     Acceptable specimen types for this assay include APTIMA Swabs (Unisex,     endocervical, urethral, or vaginal), first void urine, and ThinPrep     liquid based cytology samples.  RESPIRATORY VIRUS PANEL     Status: None   Collection Time    05/15/12 10:40 AM      Result Value Range Status   Source - RVPAN NASAL SWAB    Corrected   Comment: CORRECTED ON 05/15 AT 2024: PREVIOUSLY REPORTED AS NASAL SWAB   Respiratory Syncytial Virus A NOT DETECTED   Final   Respiratory Syncytial Virus B NOT DETECTED   Final   Influenza A NOT DETECTED   Final   Influenza B NOT DETECTED   Final   Parainfluenza 1 NOT DETECTED   Final   Parainfluenza 2 NOT DETECTED   Final   Parainfluenza 3 NOT DETECTED   Final   Metapneumovirus NOT DETECTED   Final   Rhinovirus NOT DETECTED   Final   Adenovirus NOT DETECTED   Final   Influenza A H1 NOT DETECTED   Final   Influenza A H3 NOT DETECTED   Final   Comment: (NOTE)           Normal Reference Range for each Analyte: NOT DETECTED     Testing performed using the Luminex xTAG Respiratory Viral Panel test     kit.     This test was developed and its performance characteristics determined     by [employer]. It has not been cleared or approved by the [place]     Food and Drug Administration. This test is used for clinical purposes.     It should not be regarded as investigational or for research. This     laboratory is certified under the Clinical Laboratory Improvement     Amendments of 1988 (CLIA) as qualified to perform high complexity     clinical laboratory testing.  MRSA PCR SCREENING     Status: None   Collection Time    05/15/12 10:40 AM      Result Value Range Status   MRSA by PCR NEGATIVE  NEGATIVE Final   Comment:            The GeneXpert MRSA Assay (FDA     approved for NASAL specimens     only), is one component of a     comprehensive MRSA colonization     surveillance program. It is not     intended to diagnose MRSA     infection nor to guide or     monitor treatment for     MRSA infections.  AFB CULTURE, BLOOD     Status: None   Collection Time    05/16/12 12:20 AM      Result Value Range Status   Specimen Description BLOOD LEFT ARM   Final   Special Requests 5CC   Final   Culture     Final   Value: CULTURE WILL BE EXAMINED FOR 6 WEEKS BEFORE ISSUING  A FINAL REPORT   Report Status PENDING   Incomplete  AFB CULTURE, BLOOD     Status: None   Collection Time    05/17/12 10:01 AM      Result Value Range Status   Specimen Description BONE MARROW   Final   Special Requests AFB ONLY/2CC  Final   Culture     Final   Value: CULTURE WILL BE EXAMINED FOR 6 WEEKS BEFORE ISSUING A FINAL REPORT   Report Status PENDING   Incomplete  CULTURE, BLOOD (ROUTINE X 2)     Status: None   Collection Time    05/17/12 10:01 AM      Result Value Range Status   Specimen Description BONE MARROW   Final   Special Requests BOTTLES DRAWN AEROBIC ONLY 2CC   Final   Culture  Setup Time 05/18/2012 08:37   Final   Culture     Final   Value:        BLOOD CULTURE RECEIVED NO GROWTH TO DATE CULTURE WILL BE HELD FOR 5 DAYS BEFORE ISSUING A FINAL NEGATIVE REPORT   Report Status PENDING   Incomplete  FUNGUS CULTURE, BLOOD     Status: None   Collection Time    05/17/12 10:01 AM      Result Value Range Status   Specimen Description BONE MARROW   Final   Special Requests BOTTLES DRAWN AEROBIC ONLY 2CC   Final   Culture NO FUNGUS ISOLATED;CULTURE IN PROGRESS FOR 7 DAYS   Final   Report Status PENDING   Incomplete   Studies/Results: Ct Head Wo Contrast  05/21/2012   *RADIOLOGY REPORT*  Clinical Data: Night sweats, chills, fatigue and dizziness.  HIV positive.  CT HEAD WITHOUT CONTRAST  Technique:  Contiguous axial images were obtained from the base of the skull through the vertex without contrast.  Comparison: 05/12/2012.  Findings: Faint physiologic calcifications in the left basal ganglia are unchanged.  No acute intracranial abnormalities. Specifically, no evidence of acute intracranial hemorrhage, no definite findings of acute/subacute cerebral ischemia, no mass, mass effect, hydrocephalus or abnormal intra or extra-axial fluid collections.  Visualized paranasal sinuses and mastoids are well pneumatized.  No acute displaced skull fractures are identified.  IMPRESSION: 1.   No acute intracranial abnormalities. 2.  The appearance of the brain is normal.   Original Report Authenticated By: Trudie Reed, M.D.   Dg Fluoro Guide Lumbar Puncture  05/21/2012   *RADIOLOGY REPORT*  Clinical Data:  History of left sided headache.  History of histoplasmosis.  DIAGNOSTIC LUMBAR PUNCTURE UNDER FLUOROSCOPIC GUIDANCE  Fluoroscopy time:   12 seconds  Technique:  Informed consent was obtained from the patient prior to the procedure, including potential complications of headache, allergy, and pain.   With the patient prone, the lower back was prepped with Betadine.  1% Lidocaine was used for local anesthesia. Lumbar puncture was performed at the L4-L5 level on the left using a 20 gauge needle with return of clear CSF with an opening pressure of 17 cm water.   18 ml of CSF were obtained for laboratory studies.  The patient tolerated the procedure well and there were no apparent complications.  IMPRESSION: Successful diagnostic lumbar puncture performed by Dr. Call under intermittent pulsed fluoroscopic guidance.   Original Report Authenticated By: Onalee Hua Call   Medications: I have reviewed the patient's current medications. Scheduled Meds: . acetaminophen  650 mg Oral Q24H  . amphotericin-B lipid  5 mg/kg Intravenous Q24H  . [START ON 05/27/2012] azithromycin  1,200 mg Oral Weekly  . diphenhydrAMINE  25 mg Intravenous Q24H   Or  . diphenhydrAMINE  25 mg Oral Q24H  . feeding supplement  237 mL Oral TID BM  . pneumococcal 23 valent vaccine  0.5 mL Intramuscular Tomorrow-1000  . sodium chloride  500 mL Intravenous Q24H  And  . sodium chloride  500 mL Intravenous Q24H  . sulfamethoxazole-trimethoprim  1 tablet Oral Daily   Continuous Infusions: . sodium chloride 150 mL/hr at 05/22/12 0616   PRN Meds:.acetaminophen, acetaminophen, meperidine (DEMEROL) injection  Assessment/Plan: Patient is a 32 y.o. male with a PMHx of AIDS (CD4 < 10) with history of prior disseminated  histoplasmosis in 2008, medication noncompliance and recent admission from 5/14-5/16 for FUO (thought possibly related to disseminated histoplasmosis vs disseminated TB vs MAC) who returns to [facility] as a direct admission from [facility] for recurrent fevers, volume depletion with orthostasis.  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 He was treated in 2008 for the same. Another possibility includes MAC infection. He did not refill his azithromycin, ethambutol, and rifabutin, which had been prescribed on his last admission due to the cost- he was to receive a 30 day supply from [facility] prior to his last discharge which would have been $11.  Results from the bone marrow biopsy performed on 5/16 are still pending, but Dr. Ninetta Lights called the Pathologist and the results show histoplasmosis. Histoplasma Ag in urine ordered, which is less sensitive compared to [facility] as noted from Dr Clinton Gallant note, so obtained LP, unsuccessful at the bedside, then done under Fluoro and sent for fungal culture and Histo Ag testing to send to [facility]. IV Amphiotericin B started after LP. He will need a PICC placed prior to d/c home, which will possibly be in the next few days. Discussed the need for VI abx for 2-6 weeks after discharge through the interpreter phone line. He remains febrile today.  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 Patient was found to be orthostatic in the clinic when he was evaluated by Dr. Terri Piedra. He reports some symptoms of dizziness, especially with long-standing. Bolused 2L with NS and BP improved. Orthostatic overnight. BP low this am, bolusing another liter of NS. - NS@150    HIV/AIDS: CD4 on 5/14 less than 10. Viral load is 62,000. Patient has been off  medication for about 2 years after getting lost to follow up in 2012. The timing for the initiation of his antiretroviral treatment has been deferred to ID.  - Azithromycin weekly for MAI prophylaxis.  - Bactrim daily for PCP prophylaxis.  - Histo therapy with IV Amphotericin B for 2-6 weeks pending LP results  Psoriatic-like rash on BLE and warts on hands: As discussed in problem #1, the most likely etiology of his rash/warts is recurrence of histoplasmosis. With his bone marrow biopsy results, holding off on biopsying the lesions on his legs and hands and treating for disseminated histo after LP done.  - Plan as above  Pancytopenia: Patient has low WBC counts of 1.1. Hgb stable at 8.0 today and platelets 46 from 52 yesterday. Presumed from HIV.   DVT PPx: SCDs.  Dispo: Disposition is deferred at this time, awaiting improvement of current medical problems.  Anticipated discharge in approximately 1-3 day(s).   The patient does not have a current PCP (No PCP Per Patient), therefore will not be requiring OPC follow-up after discharge, will f/i with ID Clinic.   The patient does not have transportation limitations that hinder transportation to clinic appointments.  .Services Needed at time of discharge: Y =  Yes, Blank = No PT:   OT:   RN:   Equipment:   Other:     LOS: 2 days   Genelle Gather 05/22/2012, 8:46 AM

## 2012-05-22 NOTE — Progress Notes (Signed)
Interpreter David Knapp for [facility].

## 2012-05-22 NOTE — Progress Notes (Signed)
Pt sweating with no increased temperature. Sheets changed x2.

## 2012-05-22 NOTE — Progress Notes (Signed)
Internal Medicine Teaching Service Attending Note Date: 05/22/2012  Patient name: David Knapp  Medical record number: [id number]  Date of birth: 07-Jan-1980    This patient has been seen and discussed with the house staff. Please see their note for complete details. I concur with their findings.   Azariya Freeman 05/22/2012, 2:06 PM

## 2012-05-22 NOTE — Progress Notes (Signed)
INFECTIOUS DISEASE PROGRESS NOTE  ID: David Knapp is a 32 y.o. male with   Principal Problem:   Fever Active Problems:   HIV DISEASE   WARTS, BOTH HANDS   HISTOPLASMOSIS, DISSEMINATED   Pancytopenia   Orthostatic hypotension  Subjective: Without complaints. Feels better.   Abtx:  Anti-infectives   Start     Dose/Rate Route Frequency Ordered Stop   05/27/12 2100  azithromycin (ZITHROMAX) tablet 1,250 mg  Status:  Discontinued     1,250 mg Oral Weekly 05/20/12 2105 05/20/12 2107   05/27/12 1000  azithromycin (ZITHROMAX) tablet 1,200 mg     1,200 mg Oral Weekly 05/20/12 2108     05/22/12 1730  amphotericin B lipid (ABELCET) 355 mg in dextrose 5 % 250 mL IVPB     5 mg/kg  71 kg 160.5 mL/hr over 120 Minutes Intravenous Every 24 hours 05/21/12 1627     05/21/12 1830  amphotericin B lipid (ABELCET) 355 mg in dextrose 5 % 250 mL IVPB     5 mg/kg  71 kg 160.5 mL/hr over 120 Minutes Intravenous  Once 05/21/12 1824 05/21/12 2029   05/21/12 1400  amphotericin B lipid (ABELCET) 355 mg in dextrose 5 % 250 mL IVPB  Status:  Discontinued     5 mg/kg  71 kg 160.5 mL/hr over 120 Minutes Intravenous Every 24 hours 05/21/12 1040 05/21/12 1627   05/20/12 2100  sulfamethoxazole-trimethoprim (BACTRIM DS) 800-160 MG per tablet 1 tablet     1 tablet Oral Daily 05/20/12 2048     05/20/12 2100  azithromycin (ZITHROMAX) tablet 1,250 mg  Status:  Discontinued     1,250 mg Oral Daily 05/20/12 2048 05/20/12 2105      Medications:  Scheduled: . acetaminophen  650 mg Oral Q24H  . amphotericin-B lipid  5 mg/kg Intravenous Q24H  . [START ON 05/27/2012] azithromycin  1,200 mg Oral Weekly  . diphenhydrAMINE  25 mg Intravenous Q24H   Or  . diphenhydrAMINE  25 mg Oral Q24H  . feeding supplement  237 mL Oral TID BM  . sodium chloride  500 mL Intravenous Q24H   And  . sodium chloride  500 mL Intravenous Q24H  . sulfamethoxazole-trimethoprim  1 tablet Oral Daily    Objective: Vital signs in last  24 hours: Temp:  [97.7 F (36.5 C)-102.6 F (39.2 C)] 99.7 F (37.6 C) (05/21 0904) Pulse Rate:  [84-115] 98 (05/21 0904) Resp:  [16-28] 20 (05/21 0904) BP: (75-102)/(37-53) 92/49 mmHg (05/21 0904) SpO2:  [93 %-100 %] 95 % (05/21 0904)   General appearance: alert, cooperative and no distress Resp: clear to auscultation bilaterally Cardio: regular rate and rhythm GI: normal findings: bowel sounds normal and soft, non-tender  Lab Results  Recent Labs  05/21/12 0500 05/22/12 0520  WBC 1.4* 1.1*  HGB 8.1* 8.0*  HCT 24.1* 23.9*  NA 132* 132*  K 3.9 4.0  CL 102 102  CO2 24 20  BUN 8 9  CREATININE 0.68 0.88   Liver Panel  Recent Labs  05/20/12 2111  PROT 7.3  ALBUMIN 2.3*  AST 106*  ALT 75*  ALKPHOS 119*  BILITOT 0.6   Sedimentation Rate No results found for this basename: ESRSEDRATE,  in the last 72 hours C-Reactive Protein No results found for this basename: CRP,  in the last 72 hours  Microbiology: Recent Results (from the past 240 hour(s))  CULTURE, BLOOD (ROUTINE X 2)     Status: None   Collection Time  05/15/12  5:35 AM      Result Value Range Status   Specimen Description BLOOD LEFT ARM   Final   Special Requests BOTTLES DRAWN AEROBIC ONLY 10CC   Final   Culture  Setup Time 05/15/2012 09:09   Final   Culture NO GROWTH 5 DAYS   Final   Report Status 05/21/2012 FINAL   Final  CULTURE, BLOOD (ROUTINE X 2)     Status: None   Collection Time    05/15/12  5:40 AM      Result Value Range Status   Specimen Description BLOOD LEFT HAND   Final   Special Requests BOTTLES DRAWN AEROBIC ONLY 10CC   Final   Culture  Setup Time 05/15/2012 09:09   Final   Culture NO GROWTH 5 DAYS   Final   Report Status 05/21/2012 FINAL   Final  FUNGUS CULTURE, BLOOD     Status: None   Collection Time    05/15/12  5:40 AM      Result Value Range Status   Specimen Description BLOOD LEFT HAND   Final   Special Requests BOTTLES DRAWN AEROBIC ONLY 10CC   Final   Culture NO  GROWTH 7 DAYS   Final   Report Status 05/22/2012 FINAL   Final  URINE CULTURE     Status: None   Collection Time    05/15/12  6:40 AM      Result Value Range Status   Specimen Description URINE, RANDOM   Final   Special Requests NONE   Final   Culture  Setup Time 05/15/2012 12:50   Final   Colony Count NO GROWTH   Final   Culture NO GROWTH   Final   Report Status 05/16/2012 FINAL   Final  GC/CHLAMYDIA PROBE AMP     Status: None   Collection Time    05/15/12  6:40 AM      Result Value Range Status   CT Probe RNA NEGATIVE  NEGATIVE Final   GC Probe RNA NEGATIVE  NEGATIVE Final   Comment: (NOTE)                                                                                              Normal Reference Range: Negative          Assay performed using the Gen-Probe APTIMA COMBO2 (R) Assay.     Acceptable specimen types for this assay include APTIMA Swabs (Unisex,     endocervical, urethral, or vaginal), first void urine, and ThinPrep     liquid based cytology samples.  RESPIRATORY VIRUS PANEL     Status: None   Collection Time    05/15/12 10:40 AM      Result Value Range Status   Source - RVPAN NASAL SWAB   Corrected   Comment: CORRECTED ON 05/15 AT 2024: PREVIOUSLY REPORTED AS NASAL SWAB   Respiratory Syncytial Virus A NOT DETECTED   Final   Respiratory Syncytial Virus B NOT DETECTED   Final   Influenza A NOT DETECTED   Final   Influenza B NOT DETECTED   Final  Parainfluenza 1 NOT DETECTED   Final   Parainfluenza 2 NOT DETECTED   Final   Parainfluenza 3 NOT DETECTED   Final   Metapneumovirus NOT DETECTED   Final   Rhinovirus NOT DETECTED   Final   Adenovirus NOT DETECTED   Final   Influenza A H1 NOT DETECTED   Final   Influenza A H3 NOT DETECTED   Final   Comment: (NOTE)           Normal Reference Range for each Analyte: NOT DETECTED     Testing performed using the Luminex xTAG Respiratory Viral Panel test     kit.     This test was developed and its performance  characteristics determined     by [employer]. It has not been cleared or approved by the [place]     Food and Drug Administration. This test is used for clinical purposes.     It should not be regarded as investigational or for research. This     laboratory is certified under the Clinical Laboratory Improvement     Amendments of 1988 (CLIA) as qualified to perform high complexity     clinical laboratory testing.  MRSA PCR SCREENING     Status: None   Collection Time    05/15/12 10:40 AM      Result Value Range Status   MRSA by PCR NEGATIVE  NEGATIVE Final   Comment:            The GeneXpert MRSA Assay (FDA     approved for NASAL specimens     only), is one component of a     comprehensive MRSA colonization     surveillance program. It is not     intended to diagnose MRSA     infection nor to guide or     monitor treatment for     MRSA infections.  AFB CULTURE, BLOOD     Status: None   Collection Time    05/16/12 12:20 AM      Result Value Range Status   Specimen Description BLOOD LEFT ARM   Final   Special Requests 5CC   Final   Culture     Final   Value: CULTURE WILL BE EXAMINED FOR 6 WEEKS BEFORE ISSUING A FINAL REPORT   Report Status PENDING   Incomplete  AFB CULTURE, BLOOD     Status: None   Collection Time    05/17/12 10:01 AM      Result Value Range Status   Specimen Description BONE MARROW   Final   Special Requests AFB ONLY/2CC   Final   Culture     Final   Value: CULTURE WILL BE EXAMINED FOR 6 WEEKS BEFORE ISSUING A FINAL REPORT   Report Status PENDING   Incomplete  CULTURE, BLOOD (ROUTINE X 2)     Status: None   Collection Time    05/17/12 10:01 AM      Result Value Range Status   Specimen Description BONE MARROW   Final   Special Requests BOTTLES DRAWN AEROBIC ONLY 2CC   Final   Culture  Setup Time 05/18/2012 08:37   Final   Culture     Final   Value:        BLOOD CULTURE RECEIVED NO GROWTH TO DATE CULTURE WILL BE HELD FOR 5 DAYS BEFORE ISSUING A FINAL  NEGATIVE REPORT   Report Status PENDING   Incomplete  FUNGUS CULTURE, BLOOD  Status: None   Collection Time    05/17/12 10:01 AM      Result Value Range Status   Specimen Description BONE MARROW   Final   Special Requests BOTTLES DRAWN AEROBIC ONLY 2CC   Final   Culture NO FUNGUS ISOLATED;CULTURE IN PROGRESS FOR 7 DAYS   Final   Report Status PENDING   Incomplete  GRAM STAIN     Status: None   Collection Time    05/21/12  2:32 PM      Result Value Range Status   Specimen Description CSF   Final   Special Requests 5.0ML FLUID   Final   Gram Stain     Final   Value: CYTOSPIN PREP     WBC PRESENT, PREDOMINANTLY MONONUCLEAR     NO ORGANISMS SEEN   Report Status 05/21/2012 FINAL   Final  FUNGUS CULTURE W SMEAR     Status: None   Collection Time    05/21/12  2:52 PM      Result Value Range Status   Specimen Description CSF   Final   Special Requests 4.0ML FLUID   Final   Fungal Smear NO YEAST OR FUNGAL ELEMENTS SEEN   Final   Culture CULTURE IN PROGRESS FOR FOUR WEEKS   Final   Report Status PENDING   Incomplete  CSF CULTURE     Status: None   Collection Time    05/21/12  2:52 PM      Result Value Range Status   Specimen Description CSF   Final   Special Requests 5.0ML FLUID   Final   Gram Stain     Final   Value: CYTOSPIN WBC PRESENT, PREDOMINANTLY MONONUCLEAR     NO ORGANISMS SEEN     Performed at [facility]   Culture NO GROWTH 1 DAY   Final   Report Status PENDING   Incomplete    Studies/Results: Ct Head Wo Contrast  05/21/2012   *RADIOLOGY REPORT*  Clinical Data: Night sweats, chills, fatigue and dizziness.  HIV positive.  CT HEAD WITHOUT CONTRAST  Technique:  Contiguous axial images were obtained from the base of the skull through the vertex without contrast.  Comparison: 05/12/2012.  Findings: Faint physiologic calcifications in the left basal ganglia are unchanged.  No acute intracranial abnormalities. Specifically, no evidence of acute intracranial  hemorrhage, no definite findings of acute/subacute cerebral ischemia, no mass, mass effect, hydrocephalus or abnormal intra or extra-axial fluid collections.  Visualized paranasal sinuses and mastoids are well pneumatized.  No acute displaced skull fractures are identified.  IMPRESSION: 1.  No acute intracranial abnormalities. 2.  The appearance of the brain is normal.   Original Report Authenticated By: Trudie Reed, M.D.   Dg Fluoro Guide Lumbar Puncture  05/21/2012   *RADIOLOGY REPORT*  Clinical Data:  History of left sided headache.  History of histoplasmosis.  DIAGNOSTIC LUMBAR PUNCTURE UNDER FLUOROSCOPIC GUIDANCE  Fluoroscopy time:   12 seconds  Technique:  Informed consent was obtained from the patient prior to the procedure, including potential complications of headache, allergy, and pain.   With the patient prone, the lower back was prepped with Betadine.  1% Lidocaine was used for local anesthesia. Lumbar puncture was performed at the L4-L5 level on the left using a 20 gauge needle with return of clear CSF with an opening pressure of 17 cm water.   18 ml of CSF were obtained for laboratory studies.  The patient tolerated the procedure well and there were no  apparent complications.  IMPRESSION: Successful diagnostic lumbar puncture performed by Dr. Call under intermittent pulsed fluoroscopic guidance.   Original Report Authenticated By: Onalee Hua Call     Assessment/Plan: AIDS  Non-compliance Histoplasmosis, disseminated pancytopenia  Multiple Cx pending,  His Cr is stable on ampho so far.  His LP does not suggest CNS involvement.  Will need 14 days of IV ampho prior to transition to itra.  Could try to set this up at home (or via [facility] infusion center) Would have him f/u in ID clinic prior to completion of IV therapy.   Total days of antibiotics: Amphotericin day 2         Johny Sax Infectious Diseases [phone] [email or web address] 05/22/2012, 3:26 PM   LOS: 2 days

## 2012-05-23 DIAGNOSIS — B399 Histoplasmosis, unspecified: Secondary | ICD-10-CM

## 2012-05-23 DIAGNOSIS — B20 Human immunodeficiency virus [HIV] disease: Principal | ICD-10-CM

## 2012-05-23 LAB — CBC
HCT: 24.7 % — ABNORMAL LOW (ref 39.0–52.0)
Hemoglobin: 8.2 g/dL — ABNORMAL LOW (ref 13.0–17.0)
WBC: 1.1 10*3/uL — CL (ref 4.0–10.5)

## 2012-05-23 LAB — BASIC METABOLIC PANEL
BUN: 7 mg/dL (ref 6–23)
Chloride: 107 mEq/L (ref 96–112)
GFR calc Af Amer: >90 mL/min (ref >90)
Potassium: 3.6 mEq/L (ref 3.5–5.1)

## 2012-05-23 LAB — IRON AND TIBC
Iron: 28 ug/dL — ABNORMAL LOW (ref 42–135)
Saturation Ratios: 18 % — ABNORMAL LOW (ref 20–55)
UIBC: 130 ug/dL (ref 125–400)

## 2012-05-23 LAB — RETICULOCYTES: Retic Count, Absolute: 37.1 10*3/uL (ref 19.0–186.0)

## 2012-05-23 LAB — FERRITIN: Ferritin: 3205 ng/mL — ABNORMAL HIGH (ref 22–322)

## 2012-05-23 MED ORDER — DEXTROSE 5 % IV SOLN: 5.0000 mg/kg | INTRAVENOUS | Status: DC

## 2012-05-23 MED ORDER — HEPARIN SOD (PORK) LOCK FLUSH 100 UNIT/ML IV SOLN
250.0000 [IU] | INTRAVENOUS | Status: DC | PRN
  Administered 2012-05-23: 250 [IU]
  Filled 2012-05-23: qty 3

## 2012-05-23 MED ORDER — SULFAMETHOXAZOLE-TMP DS 800-160 MG PO TABS: 1.0000 | ORAL_TABLET | Freq: Every day | ORAL | Status: DC

## 2012-05-23 MED ORDER — AZITHROMYCIN 600 MG PO TABS: 1200.0000 mg | ORAL_TABLET | ORAL | Status: DC

## 2012-05-23 MED ORDER — HEPARIN SOD (PORK) LOCK FLUSH 100 UNIT/ML IV SOLN
250.0000 [IU] | Freq: Every day | INTRAVENOUS | Status: DC
  Filled 2012-05-23: qty 3

## 2012-05-23 MED ORDER — ACETAMINOPHEN 325 MG PO TABS: 325.0000 mg | ORAL_TABLET | Freq: Four times a day (QID) | ORAL | Status: DC | PRN

## 2012-05-23 NOTE — Progress Notes (Signed)
Nsg Discharge Note  Admit Date:  05/20/2012 Discharge date: 05/23/2012   David Knapp to be D/C'd Home per MD order.  AVS completed.  Copy for chart, and copy for patient signed, and dated. Patient/caregiver able to verbalize understanding.  Discharge Medication:   Medication List    STOP taking these medications       ethambutol 400 MG tablet  Commonly known as:  MYAMBUTOL     rifabutin 150 MG capsule  Commonly known as:  MYCOBUTIN      TAKE these medications       acetaminophen 325 MG tablet  Commonly known as:  TYLENOL  Take 1 tablet (325 mg total) by mouth every 6 (six) hours as needed.     azithromycin 600 MG tablet  Commonly known as:  ZITHROMAX  Take 2 tablets (1,200 mg total) by mouth once a week.  Start taking on:  05/27/2012     dextrose 5 % SOLN 250 mL with amphotericin B lipid 5 MG/ML SUSP 355 mg  Inject 355 mg into the vein daily.     sulfamethoxazole-trimethoprim 800-160 MG per tablet  Commonly known as:  BACTRIM DS  Take 1 tablet by mouth daily.        Discharge Assessment: Filed Vitals:   05/23/12 1450  BP: 94/56  Pulse: 80  Temp: 98.2 F (36.8 C)  Resp: 18   Skin clean, dry and intact without evidence of skin break down, no evidence of skin tears noted. IV catheter discontinued intact. Site without signs and symptoms of complications - no redness or edema noted at insertion site, patient denies c/o pain - only slight tenderness at site.  Dressing with slight pressure applied.  D/c Instructions-Education: Discharge instructions given to patient/family with verbalized understanding. D/c education completed with patient/family including follow up instructions, medication list, d/c activities limitations if indicated, with other d/c instructions as indicated by MD - patient able to verbalize understanding, all questions fully answered. Patient instructed to return to ED, call 911, or call MD for any changes in condition.  Patient escorted via WC, and  D/C home via private auto.  David Knapp David Lose, RN 05/23/2012 6:58 PM

## 2012-05-23 NOTE — Progress Notes (Addendum)
INFECTIOUS DISEASE PROGRESS NOTE  ID: David Knapp is a 32 y.o. male with  Principal Problem:   Fever Active Problems:   HIV DISEASE   WARTS, BOTH HANDS   HISTOPLASMOSIS, DISSEMINATED   Pancytopenia   Orthostatic hypotension  Subjective: Without complaints  Abtx:  Anti-infectives   Start     Dose/Rate Route Frequency Ordered Stop   05/27/12 2100  azithromycin (ZITHROMAX) tablet 1,250 mg  Status:  Discontinued     1,250 mg Oral Weekly 05/20/12 2105 05/20/12 2107   05/27/12 1000  azithromycin (ZITHROMAX) tablet 1,200 mg     1,200 mg Oral Weekly 05/20/12 2108     05/27/12 0000  azithromycin (ZITHROMAX) 600 MG tablet     1,200 mg Oral Weekly 05/23/12 1043     05/23/12 0000  dextrose 5 % SOLN 250 mL with amphotericin B lipid 5 MG/ML SUSP 355 mg     5 mg/kg  71 kg 160.5 mL/hr over 120 Minutes Intravenous Every 24 hours 05/23/12 1043     05/23/12 0000  sulfamethoxazole-trimethoprim (BACTRIM DS) 800-160 MG per tablet     1 tablet Oral Daily 05/23/12 1043     05/22/12 1730  amphotericin B lipid (ABELCET) 355 mg in dextrose 5 % 250 mL IVPB     5 mg/kg  71 kg 160.5 mL/hr over 120 Minutes Intravenous Every 24 hours 05/21/12 1627     05/21/12 1830  amphotericin B lipid (ABELCET) 355 mg in dextrose 5 % 250 mL IVPB     5 mg/kg  71 kg 160.5 mL/hr over 120 Minutes Intravenous  Once 05/21/12 1824 05/21/12 2029   05/21/12 1400  amphotericin B lipid (ABELCET) 355 mg in dextrose 5 % 250 mL IVPB  Status:  Discontinued     5 mg/kg  71 kg 160.5 mL/hr over 120 Minutes Intravenous Every 24 hours 05/21/12 1040 05/21/12 1627   05/20/12 2100  sulfamethoxazole-trimethoprim (BACTRIM DS) 800-160 MG per tablet 1 tablet     1 tablet Oral Daily 05/20/12 2048     05/20/12 2100  azithromycin (ZITHROMAX) tablet 1,250 mg  Status:  Discontinued     1,250 mg Oral Daily 05/20/12 2048 05/20/12 2105      Medications:  Scheduled: . acetaminophen  650 mg Oral Q24H  . amphotericin-B lipid  5 mg/kg  Intravenous Q24H  . [START ON 05/27/2012] azithromycin  1,200 mg Oral Weekly  . diphenhydrAMINE  25 mg Intravenous Q24H   Or  . diphenhydrAMINE  25 mg Oral Q24H  . feeding supplement  237 mL Oral TID BM  . sodium chloride  500 mL Intravenous Q24H   And  . sodium chloride  500 mL Intravenous Q24H  . sulfamethoxazole-trimethoprim  1 tablet Oral Daily    Objective: Vital signs in last 24 hours: Temp:  [98.2 F (36.8 C)-98.4 F (36.9 C)] 98.2 F (36.8 C) (05/22 1450) Pulse Rate:  [80-98] 80 (05/22 1450) Resp:  [16-18] 18 (05/22 1450) BP: (87-97)/(52-60) 94/56 mmHg (05/22 1450) SpO2:  [98 %-100 %] 98 % (05/22 1450)   General appearance: alert, cooperative and no distress Resp: clear to auscultation bilaterally Cardio: regular rate and rhythm GI: normal findings: bowel sounds normal and soft, non-tender  Lab Results  Recent Labs  05/22/12 0520 05/23/12 0555  WBC 1.1* 1.1*  HGB 8.0* 8.2*  HCT 23.9* 24.7*  NA 132* 137  K 4.0 3.6  CL 102 107  CO2 20 22  BUN 9 7  CREATININE 0.88 0.73   Liver Panel  Recent Labs  05/20/12 2111  PROT 7.3  ALBUMIN 2.3*  AST 106*  ALT 75*  ALKPHOS 119*  BILITOT 0.6   Sedimentation Rate No results found for this basename: ESRSEDRATE,  in the last 72 hours C-Reactive Protein No results found for this basename: CRP,  in the last 72 hours  Microbiology: Recent Results (from the past 240 hour(s))  CULTURE, BLOOD (ROUTINE X 2)     Status: None   Collection Time    05/15/12  5:35 AM      Result Value Range Status   Specimen Description BLOOD LEFT ARM   Final   Special Requests BOTTLES DRAWN AEROBIC ONLY 10CC   Final   Culture  Setup Time 05/15/2012 09:09   Final   Culture NO GROWTH 5 DAYS   Final   Report Status 05/21/2012 FINAL   Final  CULTURE, BLOOD (ROUTINE X 2)     Status: None   Collection Time    05/15/12  5:40 AM      Result Value Range Status   Specimen Description BLOOD LEFT HAND   Final   Special Requests BOTTLES  DRAWN AEROBIC ONLY 10CC   Final   Culture  Setup Time 05/15/2012 09:09   Final   Culture NO GROWTH 5 DAYS   Final   Report Status 05/21/2012 FINAL   Final  FUNGUS CULTURE, BLOOD     Status: None   Collection Time    05/15/12  5:40 AM      Result Value Range Status   Specimen Description BLOOD LEFT HAND   Final   Special Requests BOTTLES DRAWN AEROBIC ONLY 10CC   Final   Culture NO GROWTH 7 DAYS   Final   Report Status 05/22/2012 FINAL   Final  URINE CULTURE     Status: None   Collection Time    05/15/12  6:40 AM      Result Value Range Status   Specimen Description URINE, RANDOM   Final   Special Requests NONE   Final   Culture  Setup Time 05/15/2012 12:50   Final   Colony Count NO GROWTH   Final   Culture NO GROWTH   Final   Report Status 05/16/2012 FINAL   Final  GC/CHLAMYDIA PROBE AMP     Status: None   Collection Time    05/15/12  6:40 AM      Result Value Range Status   CT Probe RNA NEGATIVE  NEGATIVE Final   GC Probe RNA NEGATIVE  NEGATIVE Final   Comment: (NOTE)                                                                                              Normal Reference Range: Negative          Assay performed using the Gen-Probe APTIMA COMBO2 (R) Assay.     Acceptable specimen types for this assay include APTIMA Swabs (Unisex,     endocervical, urethral, or vaginal), first void urine, and ThinPrep     liquid based cytology samples.  RESPIRATORY VIRUS PANEL     Status: None  Collection Time    05/15/12 10:40 AM      Result Value Range Status   Source - RVPAN NASAL SWAB   Corrected   Comment: CORRECTED ON 05/15 AT 2024: PREVIOUSLY REPORTED AS NASAL SWAB   Respiratory Syncytial Virus A NOT DETECTED   Final   Respiratory Syncytial Virus B NOT DETECTED   Final   Influenza A NOT DETECTED   Final   Influenza B NOT DETECTED   Final   Parainfluenza 1 NOT DETECTED   Final   Parainfluenza 2 NOT DETECTED   Final   Parainfluenza 3 NOT DETECTED   Final   Metapneumovirus NOT  DETECTED   Final   Rhinovirus NOT DETECTED   Final   Adenovirus NOT DETECTED   Final   Influenza A H1 NOT DETECTED   Final   Influenza A H3 NOT DETECTED   Final   Comment: (NOTE)           Normal Reference Range for each Analyte: NOT DETECTED     Testing performed using the Luminex xTAG Respiratory Viral Panel test     kit.     This test was developed and its performance characteristics determined     by [employer]. It has not been cleared or approved by the [place]     Food and Drug Administration. This test is used for clinical purposes.     It should not be regarded as investigational or for research. This     laboratory is certified under the Clinical Laboratory Improvement     Amendments of 1988 (CLIA) as qualified to perform high complexity     clinical laboratory testing.  MRSA PCR SCREENING     Status: None   Collection Time    05/15/12 10:40 AM      Result Value Range Status   MRSA by PCR NEGATIVE  NEGATIVE Final   Comment:            The GeneXpert MRSA Assay (FDA     approved for NASAL specimens     only), is one component of a     comprehensive MRSA colonization     surveillance program. It is not     intended to diagnose MRSA     infection nor to guide or     monitor treatment for     MRSA infections.  AFB CULTURE, BLOOD     Status: None   Collection Time    05/16/12 12:20 AM      Result Value Range Status   Specimen Description BLOOD LEFT ARM   Final   Special Requests 5CC   Final   Culture     Final   Value: CULTURE WILL BE EXAMINED FOR 6 WEEKS BEFORE ISSUING A FINAL REPORT   Report Status PENDING   Incomplete  AFB CULTURE, BLOOD     Status: None   Collection Time    05/17/12 10:01 AM      Result Value Range Status   Specimen Description BONE MARROW   Final   Special Requests AFB ONLY/2CC   Final   Culture     Final   Value: CULTURE WILL BE EXAMINED FOR 6 WEEKS BEFORE ISSUING A FINAL REPORT   Report Status PENDING   Incomplete  CULTURE, BLOOD  (ROUTINE X 2)     Status: None   Collection Time    05/17/12 10:01 AM      Result Value Range Status   Specimen Description  BONE MARROW   Final   Special Requests BOTTLES DRAWN AEROBIC ONLY 2CC   Final   Culture  Setup Time 05/18/2012 08:37   Final   Culture     Final   Value:        BLOOD CULTURE RECEIVED NO GROWTH TO DATE CULTURE WILL BE HELD FOR 5 DAYS BEFORE ISSUING A FINAL NEGATIVE REPORT   Report Status PENDING   Incomplete  FUNGUS CULTURE, BLOOD     Status: None   Collection Time    05/17/12 10:01 AM      Result Value Range Status   Specimen Description BONE MARROW   Final   Special Requests BOTTLES DRAWN AEROBIC ONLY 2CC   Final   Culture NO FUNGUS ISOLATED;CULTURE IN PROGRESS FOR 7 DAYS   Final   Report Status PENDING   Incomplete  GRAM STAIN     Status: None   Collection Time    05/21/12  2:32 PM      Result Value Range Status   Specimen Description CSF   Final   Special Requests 5.0ML FLUID   Final   Gram Stain     Final   Value: CYTOSPIN PREP     WBC PRESENT, PREDOMINANTLY MONONUCLEAR     NO ORGANISMS SEEN   Report Status 05/21/2012 FINAL   Final  AFB CULTURE WITH SMEAR     Status: None   Collection Time    05/21/12  2:52 PM      Result Value Range Status   Specimen Description CSF   Final   Special Requests 5.0ML FLUID   Final   ACID FAST SMEAR NO ACID FAST BACILLI SEEN   Final   Culture     Final   Value: CULTURE WILL BE EXAMINED FOR 6 WEEKS BEFORE ISSUING A FINAL REPORT   Report Status PENDING   Incomplete  FUNGUS CULTURE W SMEAR     Status: None   Collection Time    05/21/12  2:52 PM      Result Value Range Status   Specimen Description CSF   Final   Special Requests 4.0ML FLUID   Final   Fungal Smear NO YEAST OR FUNGAL ELEMENTS SEEN   Final   Culture CULTURE IN PROGRESS FOR FOUR WEEKS   Final   Report Status PENDING   Incomplete  CSF CULTURE     Status: None   Collection Time    05/21/12  2:52 PM      Result Value Range Status   Specimen  Description CSF   Final   Special Requests 5.0ML FLUID   Final   Gram Stain     Final   Value: CYTOSPIN WBC PRESENT, PREDOMINANTLY MONONUCLEAR     NO ORGANISMS SEEN     Performed at [facility]   Culture NO GROWTH 2 DAYS   Final   Report Status PENDING   Incomplete    Studies/Results: No results found.   Assessment/Plan: AIDS Histoplasmosis Pancytopenia  Total days of antibiotics: 3 (ampho B) Cr stable so far Needs 14 days of ampho Needs to f/u with ID clinic prior to end of this.  Needs hydration before and after ampho.  My great appreciation to the [facility].         Johny Sax Infectious Diseases [phone] [email or web address] 05/23/2012, 4:04 PM   LOS: 3 days

## 2012-05-23 NOTE — Progress Notes (Signed)
Medical Student Daily Progress Note  Subjective: NAE overnight. No complaints this AM.  Denies HA and pain.   Objective: Vital signs in last 24 hours: Filed Vitals:   05/22/12 0904 05/22/12 1539 05/22/12 2113 05/23/12 0545  BP: 92/49 94/52 97/60  87/52  Pulse: 98 92 98 81  Temp: 99.7 F (37.6 C) 98 F (36.7 C) 98.3 F (36.8 C) 98.4 F (36.9 C)  TempSrc: Oral Oral Oral Oral  Resp: 20 18 16 16   Height:      Weight:      SpO2: 95% 96% 100% 98%   Weight change:   Intake/Output Summary (Last 24 hours) at 05/23/12 0908 Last data filed at 05/23/12 0750  Gross per 24 hour  Intake 2378.5 ml  Output   4075 ml  Net -1696.5 ml    Physical Exam: BP 87/52  Pulse 81  Temp(Src) 98.4 F (36.9 C) (Oral)  Resp 16  Ht 5\' 7"  (1.702 m)  Wt 71 kg (156 lb 8.4 oz)  BMI 24.51 kg/m2  SpO2 98% General appearance: alert, cooperative, appears stated age and no distress. Laying in bed, under sheet.  Head: Normocephalic, without obvious abnormality, atraumatic  Eyes: EOMI. Sclera clear.  Throat: mucosa erythematous  Lungs: clear to auscultation bilaterally  Heart: regular rate and rhythm, S1, S2 normal, no murmur, click, rub or gallop  Abdomen: soft, non-tender; bowel sounds normal; no masses, no organomegaly  Extremities: no cyanosis or edema  Pulses: 2+ and symmetric  Skin: multiple papular lesions on BL hands, nontender, nonerythematous. Plaque/eczema like rash, dark brown with white streaks, on BL thighs and L lateral thigh, nontender, nonerythemaous.  Neurologic: CN II-XII grossly intact. Moves all 4 extremities spontaneously.    Lab Results: Basic Metabolic Panel:  Recent Labs  16/10/96 2111  05/22/12 0520 05/23/12 0555  NA 130*  < > 132* 137  K 4.0  < > 4.0 3.6  CL 99  < > 102 107  CO2 25  < > 20 22  GLUCOSE 112*  < > 101* 108*  BUN 9  < > 9 7  CREATININE 0.72  < > 0.88 0.73  CALCIUM 7.8*  < > 7.6* 7.5*  MG 2.1  --   --   --   PHOS 3.0  --   --   --   < > = values in  this interval not displayed. Liver Function Tests:  Recent Labs  05/20/12 2111  AST 106*  ALT 75*  ALKPHOS 119*  BILITOT 0.6  PROT 7.3  ALBUMIN 2.3*   No results found for this basename: LIPASE, AMYLASE,  in the last 72 hours No results found for this basename: AMMONIA,  in the last 72 hours CBC:  Recent Labs  05/20/12 2111  05/22/12 0520 05/23/12 0555  WBC 1.5*  < > 1.1* 1.1*  NEUTROABS 1.0*  --   --   --   HGB 8.9*  < > 8.0* 8.2*  HCT 26.4*  < > 23.9* 24.7*  MCV 75.6*  < > 76.1* 76.7*  PLT 56*  < > 46* 43*  < > = values in this interval not displayed. Cardiac Enzymes: No results found for this basename: CKTOTAL, CKMB, CKMBINDEX, TROPONINI,  in the last 72 hours BNP: No results found for this basename: PROBNP,  in the last 72 hours D-Dimer: No results found for this basename: DDIMER,  in the last 72 hours CBG: No results found for this basename: GLUCAP,  in the last 72 hours Hemoglobin  A1C: No results found for this basename: HGBA1C,  in the last 72 hours Fasting Lipid Panel: No results found for this basename: CHOL, HDL, LDLCALC, TRIG, CHOLHDL, LDLDIRECT,  in the last 72 hours Thyroid Function Tests: No results found for this basename: TSH, T4TOTAL, FREET4, T3FREE, THYROIDAB,  in the last 72 hours Anemia Panel: No results found for this basename: VITAMINB12, FOLATE, FERRITIN, TIBC, IRON, RETICCTPCT,  in the last 72 hours Coagulation:  Recent Labs  05/20/12 2111  LABPROT 15.9*  INR 1.30   Urine Drug Screen: Drugs of Abuse     Component Value Date/Time   LABOPIA NONE DETECTED 05/15/2012 1054   COCAINSCRNUR NONE DETECTED 05/15/2012 1054   LABBENZ NONE DETECTED 05/15/2012 1054   AMPHETMU NONE DETECTED 05/15/2012 1054   THCU NONE DETECTED 05/15/2012 1054   LABBARB NONE DETECTED 05/15/2012 1054    Alcohol Level: No results found for this basename: ETH,  in the last 72 hours Urinalysis: No results found for this basename: COLORURINE, APPERANCEUR, LABSPEC,  PHURINE, GLUCOSEU, HGBUR, BILIRUBINUR, KETONESUR, PROTEINUR, UROBILINOGEN, NITRITE, LEUKOCYTESUR,  in the last 72 hours Misc. Labs: -Ur histo: 24   Micro Results: Recent Results (from the past 240 hour(s))  CULTURE, BLOOD (ROUTINE X 2)     Status: None   Collection Time    05/15/12  5:35 AM      Result Value Range Status   Specimen Description BLOOD LEFT ARM   Final   Special Requests BOTTLES DRAWN AEROBIC ONLY 10CC   Final   Culture  Setup Time 05/15/2012 09:09   Final   Culture NO GROWTH 5 DAYS   Final   Report Status 05/21/2012 FINAL   Final  CULTURE, BLOOD (ROUTINE X 2)     Status: None   Collection Time    05/15/12  5:40 AM      Result Value Range Status   Specimen Description BLOOD LEFT HAND   Final   Special Requests BOTTLES DRAWN AEROBIC ONLY 10CC   Final   Culture  Setup Time 05/15/2012 09:09   Final   Culture NO GROWTH 5 DAYS   Final   Report Status 05/21/2012 FINAL   Final  FUNGUS CULTURE, BLOOD     Status: None   Collection Time    05/15/12  5:40 AM      Result Value Range Status   Specimen Description BLOOD LEFT HAND   Final   Special Requests BOTTLES DRAWN AEROBIC ONLY 10CC   Final   Culture NO GROWTH 7 DAYS   Final   Report Status 05/22/2012 FINAL   Final  URINE CULTURE     Status: None   Collection Time    05/15/12  6:40 AM      Result Value Range Status   Specimen Description URINE, RANDOM   Final   Special Requests NONE   Final   Culture  Setup Time 05/15/2012 12:50   Final   Colony Count NO GROWTH   Final   Culture NO GROWTH   Final   Report Status 05/16/2012 FINAL   Final  GC/CHLAMYDIA PROBE AMP     Status: None   Collection Time    05/15/12  6:40 AM      Result Value Range Status   CT Probe RNA NEGATIVE  NEGATIVE Final   GC Probe RNA NEGATIVE  NEGATIVE Final   Comment: (NOTE)  Normal Reference Range: Negative          Assay performed using the Gen-Probe  APTIMA COMBO2 (R) Assay.     Acceptable specimen types for this assay include APTIMA Swabs (Unisex,     endocervical, urethral, or vaginal), first void urine, and ThinPrep     liquid based cytology samples.  RESPIRATORY VIRUS PANEL     Status: None   Collection Time    05/15/12 10:40 AM      Result Value Range Status   Source - RVPAN NASAL SWAB   Corrected   Comment: CORRECTED ON 05/15 AT 2024: PREVIOUSLY REPORTED AS NASAL SWAB   Respiratory Syncytial Virus A NOT DETECTED   Final   Respiratory Syncytial Virus B NOT DETECTED   Final   Influenza A NOT DETECTED   Final   Influenza B NOT DETECTED   Final   Parainfluenza 1 NOT DETECTED   Final   Parainfluenza 2 NOT DETECTED   Final   Parainfluenza 3 NOT DETECTED   Final   Metapneumovirus NOT DETECTED   Final   Rhinovirus NOT DETECTED   Final   Adenovirus NOT DETECTED   Final   Influenza A H1 NOT DETECTED   Final   Influenza A H3 NOT DETECTED   Final   Comment: (NOTE)           Normal Reference Range for each Analyte: NOT DETECTED     Testing performed using the Luminex xTAG Respiratory Viral Panel test     kit.     This test was developed and its performance characteristics determined     by [employer]. It has not been cleared or approved by the [place]     Food and Drug Administration. This test is used for clinical purposes.     It should not be regarded as investigational or for research. This     laboratory is certified under the Clinical Laboratory Improvement     Amendments of 1988 (CLIA) as qualified to perform high complexity     clinical laboratory testing.  MRSA PCR SCREENING     Status: None   Collection Time    05/15/12 10:40 AM      Result Value Range Status   MRSA by PCR NEGATIVE  NEGATIVE Final   Comment:            The GeneXpert MRSA Assay (FDA     approved for NASAL specimens     only), is one component of a     comprehensive MRSA colonization     surveillance program. It is not     intended to diagnose  MRSA     infection nor to guide or     monitor treatment for     MRSA infections.  AFB CULTURE, BLOOD     Status: None   Collection Time    05/16/12 12:20 AM      Result Value Range Status   Specimen Description BLOOD LEFT ARM   Final   Special Requests 5CC   Final   Culture     Final   Value: CULTURE WILL BE EXAMINED FOR 6 WEEKS BEFORE ISSUING A FINAL REPORT   Report Status PENDING   Incomplete  AFB CULTURE, BLOOD     Status: None   Collection Time    05/17/12 10:01 AM      Result Value Range Status   Specimen Description BONE MARROW   Final   Special Requests AFB ONLY/2CC  Final   Culture     Final   Value: CULTURE WILL BE EXAMINED FOR 6 WEEKS BEFORE ISSUING A FINAL REPORT   Report Status PENDING   Incomplete  CULTURE, BLOOD (ROUTINE X 2)     Status: None   Collection Time    05/17/12 10:01 AM      Result Value Range Status   Specimen Description BONE MARROW   Final   Special Requests BOTTLES DRAWN AEROBIC ONLY 2CC   Final   Culture  Setup Time 05/18/2012 08:37   Final   Culture     Final   Value:        BLOOD CULTURE RECEIVED NO GROWTH TO DATE CULTURE WILL BE HELD FOR 5 DAYS BEFORE ISSUING A FINAL NEGATIVE REPORT   Report Status PENDING   Incomplete  FUNGUS CULTURE, BLOOD     Status: None   Collection Time    05/17/12 10:01 AM      Result Value Range Status   Specimen Description BONE MARROW   Final   Special Requests BOTTLES DRAWN AEROBIC ONLY 2CC   Final   Culture NO FUNGUS ISOLATED;CULTURE IN PROGRESS FOR 7 DAYS   Final   Report Status PENDING   Incomplete  GRAM STAIN     Status: None   Collection Time    05/21/12  2:32 PM      Result Value Range Status   Specimen Description CSF   Final   Special Requests 5.0ML FLUID   Final   Gram Stain     Final   Value: CYTOSPIN PREP     WBC PRESENT, PREDOMINANTLY MONONUCLEAR     NO ORGANISMS SEEN   Report Status 05/21/2012 FINAL   Final  AFB CULTURE WITH SMEAR     Status: None   Collection Time    05/21/12  2:52 PM       Result Value Range Status   Specimen Description CSF   Final   Special Requests 5.0ML FLUID   Final   ACID FAST SMEAR NO ACID FAST BACILLI SEEN   Final   Culture     Final   Value: CULTURE WILL BE EXAMINED FOR 6 WEEKS BEFORE ISSUING A FINAL REPORT   Report Status PENDING   Incomplete  FUNGUS CULTURE W SMEAR     Status: None   Collection Time    05/21/12  2:52 PM      Result Value Range Status   Specimen Description CSF   Final   Special Requests 4.0ML FLUID   Final   Fungal Smear NO YEAST OR FUNGAL ELEMENTS SEEN   Final   Culture CULTURE IN PROGRESS FOR FOUR WEEKS   Final   Report Status PENDING   Incomplete  CSF CULTURE     Status: None   Collection Time    05/21/12  2:52 PM      Result Value Range Status   Specimen Description CSF   Final   Special Requests 5.0ML FLUID   Final   Gram Stain     Final   Value: CYTOSPIN WBC PRESENT, PREDOMINANTLY MONONUCLEAR     NO ORGANISMS SEEN     Performed at [facility]   Culture NO GROWTH 1 DAY   Final   Report Status PENDING   Incomplete    Studies/Results: Ct Head Wo Contrast  05/21/2012   *RADIOLOGY REPORT*  Clinical Data: Night sweats, chills, fatigue and dizziness.  HIV positive.  CT HEAD WITHOUT CONTRAST  Technique:  Contiguous axial images were obtained from the base of the skull through the vertex without contrast.  Comparison: 05/12/2012.  Findings: Faint physiologic calcifications in the left basal ganglia are unchanged.  No acute intracranial abnormalities. Specifically, no evidence of acute intracranial hemorrhage, no definite findings of acute/subacute cerebral ischemia, no mass, mass effect, hydrocephalus or abnormal intra or extra-axial fluid collections.  Visualized paranasal sinuses and mastoids are well pneumatized.  No acute displaced skull fractures are identified.  IMPRESSION: 1.  No acute intracranial abnormalities. 2.  The appearance of the brain is normal.   Original Report Authenticated By: Trudie Reed, M.D.    Dg Fluoro Guide Lumbar Puncture  05/21/2012   *RADIOLOGY REPORT*  Clinical Data:  History of left sided headache.  History of histoplasmosis.  DIAGNOSTIC LUMBAR PUNCTURE UNDER FLUOROSCOPIC GUIDANCE  Fluoroscopy time:   12 seconds  Technique:  Informed consent was obtained from the patient prior to the procedure, including potential complications of headache, allergy, and pain.   With the patient prone, the lower back was prepped with Betadine.  1% Lidocaine was used for local anesthesia. Lumbar puncture was performed at the L4-L5 level on the left using a 20 gauge needle with return of clear CSF with an opening pressure of 17 cm water.   18 ml of CSF were obtained for laboratory studies.  The patient tolerated the procedure well and there were no apparent complications.  IMPRESSION: Successful diagnostic lumbar puncture performed by Dr. Call under intermittent pulsed fluoroscopic guidance.   Original Report Authenticated By: Onalee Hua Call    Medications: I have reviewed the patient's current medications. Scheduled Meds: . acetaminophen  650 mg Oral Q24H  . amphotericin-B lipid  5 mg/kg Intravenous Q24H  . [START ON 05/27/2012] azithromycin  1,200 mg Oral Weekly  . diphenhydrAMINE  25 mg Intravenous Q24H   Or  . diphenhydrAMINE  25 mg Oral Q24H  . feeding supplement  237 mL Oral TID BM  . sodium chloride  500 mL Intravenous Q24H   And  . sodium chloride  500 mL Intravenous Q24H  . sulfamethoxazole-trimethoprim  1 tablet Oral Daily   Continuous Infusions: . 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  -Continue amphotericin B 355 daily (started 5/20). Pretreat with benedryl, APAP.  -CSF analysis does not suggest CNS involvement.  CSF gram stain did not show any organisms.  Will plan on 2 weeks of IV amphotericin B followed by 4 weeks of PO amphotericin B. -Ur histoplasma antigen high @ 24.  -CSF histoplasma antigen pending. -AFB, fungus blood and CSF cultures pending.  -CMP in AM.  AIDS  -HIV genotype: resistance to efavirenz and nevirapine.  -Infectious disease following. Appreciate recs.  -Defer restarting ART at this time.  -Azithromycin 1.2 g for MAI prophylaxis. PCP prophylaxis with bactrim 160 mg daily.  -S/p administration of pneumococcal 23 valent vaccine.  -Crypto antigen neg; Resp panel neg; GC/C neg; RPR neg; RMS fever neg; hep panel neg; ehrlichia panel neg; quantiferon gold neg; CMV PCR wnl.   Hypotension  -HDS; Baseline ~100/50s. -Likely related to volume depletion as BP improved with IVF.   -  S/p 3 bolus of NS. NS @ 150 cc/hr.  -Monitor per unit protocol.   Pancytopenia -CBC low but stable. -No need for daily CBCs.  Hypoalbuminemia  -Likely 2/2 chronic disease.  -Regular diet w/ supplements. Encourage PO intake.   Prophylaxis: SCDs   Access: R PICC (NS 5/21)   Code: full   Dispo: floor status  -Anticipated date of discharge: today/tomorrow   This is a Psychologist, occupational Note.  The care of the patient was discussed with Dr. Dorma Russell and the assessment and plan formulated with their assistance.  Please see their attached note for official documentation of the daily encounter.  David Knapp 05/23/2012, 9:08 AM

## 2012-05-23 NOTE — Progress Notes (Addendum)
Subjective: Denies any pain, fevers, or chills. Is having sweats.  Objective: Vital signs in last 24 hours: Filed Vitals:   05/22/12 0904 05/22/12 1539 05/22/12 2113 05/23/12 0545  BP: 92/49 94/52 97/60  87/52  Pulse: 98 92 98 81  Temp: 99.7 F (37.6 C) 98 F (36.7 C) 98.3 F (36.8 C) 98.4 F (36.9 C)  TempSrc: Oral Oral Oral Oral  Resp: 20 18 16 16   Height:      Weight:      SpO2: 95% 96% 100% 98%   Weight change:   Intake/Output Summary (Last 24 hours) at 05/23/12 1044 Last data filed at 05/23/12 0750  Gross per 24 hour  Intake 2378.5 ml  Output   4075 ml  Net -1696.5 ml   Vitals reviewed. General: resting in bed, NAD HEENT: PERRL, EOMI, no scleral icterus Cardiac: RRR, no rubs, murmurs or gallops Pulm: clear to auscultation bilaterally, no wheezes, rales, or rhonchi Abd: soft, nontender, nondistended, BS present Ext: Wart-like lesions on bilateral hands and psoriatic-like plaques on BLE. Warm and well perfused, no pedal edema Neuro: alert and oriented X3, nonfocal  Lab Results: Basic Metabolic Panel:  Recent Labs Lab 05/20/12 2111  05/22/12 0520 05/23/12 0555  NA 130*  < > 132* 137  K 4.0  < > 4.0 3.6  CL 99  < > 102 107  CO2 25  < > 20 22  GLUCOSE 112*  < > 101* 108*  BUN 9  < > 9 7  CREATININE 0.72  < > 0.88 0.73  CALCIUM 7.8*  < > 7.6* 7.5*  MG 2.1  --   --   --   PHOS 3.0  --   --   --   < > = values in this interval not displayed. Liver Function Tests:  Recent Labs Lab 05/17/12 1022 05/20/12 2111  AST 99* 106*  ALT 67* 75*  ALKPHOS 134* 119*  BILITOT 0.6 0.6  PROT 7.6 7.3  ALBUMIN 2.5* 2.3*   CBC:  Recent Labs Lab 05/20/12 2111  05/22/12 0520 05/23/12 0555  WBC 1.5*  < > 1.1* 1.1*  NEUTROABS 1.0*  --   --   --   HGB 8.9*  < > 8.0* 8.2*  HCT 26.4*  < > 23.9* 24.7*  MCV 75.6*  < > 76.1* 76.7*  PLT 56*  < > 46* 43*  < > = values in this interval not displayed.  Coagulation:  Recent Labs Lab 05/20/12 2111  LABPROT 15.9*   INR 1.30   Urine Drug Screen: Drugs of Abuse     Component Value Date/Time   LABOPIA NONE DETECTED 05/15/2012 1054   COCAINSCRNUR NONE DETECTED 05/15/2012 1054   LABBENZ NONE DETECTED 05/15/2012 1054   AMPHETMU NONE DETECTED 05/15/2012 1054   THCU NONE DETECTED 05/15/2012 1054   LABBARB NONE DETECTED 05/15/2012 1054    Micro Results: Recent Results (from the past 720 hour(s))  URINE CULTURE     Status: None   Collection Time    05/12/12  8:48 AM      Result Value Range Status   Specimen Description URINE, RANDOM   Final   Special Requests NONE   Final   Culture  Setup Time 05/12/2012 19:58   Final   Colony Count NO GROWTH   Final   Culture NO GROWTH   Final   Report Status 05/13/2012 FINAL   Final  CULTURE, BLOOD (ROUTINE X 2)     Status: None   Collection Time  05/12/12  9:23 AM      Result Value Range Status   Specimen Description BLOOD RIGHT HAND   Final   Special Requests BOTTLES DRAWN AEROBIC ONLY 8CC   Final   Culture  Setup Time 05/12/2012 18:46   Final   Culture NO GROWTH 5 DAYS   Final   Report Status 05/18/2012 FINAL   Final  CULTURE, BLOOD (ROUTINE X 2)     Status: None   Collection Time    05/12/12  9:25 AM      Result Value Range Status   Specimen Description BLOOD RIGHT ARM   Final   Special Requests BOTTLES DRAWN AEROBIC ONLY 10CC   Final   Culture  Setup Time 05/12/2012 18:46   Final   Culture NO GROWTH 5 DAYS   Final   Report Status 05/18/2012 FINAL   Final  AFB CULTURE, BLOOD     Status: None   Collection Time    05/12/12  2:03 PM      Result Value Range Status   Specimen Description BLOOD RIGHT ARM   Final   Special Requests BOTTLES DRAWN AEROBIC ONLY 5CC   Final   Culture     Final   Value: CULTURE WILL BE EXAMINED FOR 6 WEEKS BEFORE ISSUING A FINAL REPORT   Report Status PENDING   Incomplete  CULTURE, BLOOD (ROUTINE X 2)     Status: None   Collection Time    05/15/12  5:35 AM      Result Value Range Status   Specimen Description BLOOD LEFT  ARM   Final   Special Requests BOTTLES DRAWN AEROBIC ONLY 10CC   Final   Culture  Setup Time 05/15/2012 09:09   Final   Culture NO GROWTH 5 DAYS   Final   Report Status 05/21/2012 FINAL   Final  CULTURE, BLOOD (ROUTINE X 2)     Status: None   Collection Time    05/15/12  5:40 AM      Result Value Range Status   Specimen Description BLOOD LEFT HAND   Final   Special Requests BOTTLES DRAWN AEROBIC ONLY 10CC   Final   Culture  Setup Time 05/15/2012 09:09   Final   Culture NO GROWTH 5 DAYS   Final   Report Status 05/21/2012 FINAL   Final  FUNGUS CULTURE, BLOOD     Status: None   Collection Time    05/15/12  5:40 AM      Result Value Range Status   Specimen Description BLOOD LEFT HAND   Final   Special Requests BOTTLES DRAWN AEROBIC ONLY 10CC   Final   Culture NO GROWTH 7 DAYS   Final   Report Status 05/22/2012 FINAL   Final  URINE CULTURE     Status: None   Collection Time    05/15/12  6:40 AM      Result Value Range Status   Specimen Description URINE, RANDOM   Final   Special Requests NONE   Final   Culture  Setup Time 05/15/2012 12:50   Final   Colony Count NO GROWTH   Final   Culture NO GROWTH   Final   Report Status 05/16/2012 FINAL   Final  GC/CHLAMYDIA PROBE AMP     Status: None   Collection Time    05/15/12  6:40 AM      Result Value Range Status   CT Probe RNA NEGATIVE  NEGATIVE Final   GC Probe RNA NEGATIVE  NEGATIVE Final   Comment: (NOTE)                                                                                              Normal Reference Range: Negative          Assay performed using the Gen-Probe APTIMA COMBO2 (R) Assay.     Acceptable specimen types for this assay include APTIMA Swabs (Unisex,     endocervical, urethral, or vaginal), first void urine, and ThinPrep     liquid based cytology samples.  RESPIRATORY VIRUS PANEL     Status: None   Collection Time    05/15/12 10:40 AM      Result Value Range Status   Source - RVPAN NASAL SWAB   Corrected    Comment: CORRECTED ON 05/15 AT 2024: PREVIOUSLY REPORTED AS NASAL SWAB   Respiratory Syncytial Virus A NOT DETECTED   Final   Respiratory Syncytial Virus B NOT DETECTED   Final   Influenza A NOT DETECTED   Final   Influenza B NOT DETECTED   Final   Parainfluenza 1 NOT DETECTED   Final   Parainfluenza 2 NOT DETECTED   Final   Parainfluenza 3 NOT DETECTED   Final   Metapneumovirus NOT DETECTED   Final   Rhinovirus NOT DETECTED   Final   Adenovirus NOT DETECTED   Final   Influenza A H1 NOT DETECTED   Final   Influenza A H3 NOT DETECTED   Final   Comment: (NOTE)           Normal Reference Range for each Analyte: NOT DETECTED     Testing performed using the Luminex xTAG Respiratory Viral Panel test     kit.     This test was developed and its performance characteristics determined     by [employer]. It has not been cleared or approved by the [place]     Food and Drug Administration. This test is used for clinical purposes.     It should not be regarded as investigational or for research. This     laboratory is certified under the Clinical Laboratory Improvement     Amendments of 1988 (CLIA) as qualified to perform high complexity     clinical laboratory testing.  MRSA PCR SCREENING     Status: None   Collection Time    05/15/12 10:40 AM      Result Value Range Status   MRSA by PCR NEGATIVE  NEGATIVE Final   Comment:            The GeneXpert MRSA Assay (FDA     approved for NASAL specimens     only), is one component of a     comprehensive MRSA colonization     surveillance program. It is not     intended to diagnose MRSA     infection nor to guide or     monitor treatment for     MRSA infections.  AFB CULTURE, BLOOD     Status: None   Collection Time    05/16/12 12:20 AM  Result Value Range Status   Specimen Description BLOOD LEFT ARM   Final   Special Requests 5CC   Final   Culture     Final   Value: CULTURE WILL BE EXAMINED FOR 6 WEEKS BEFORE ISSUING A FINAL  REPORT   Report Status PENDING   Incomplete  AFB CULTURE, BLOOD     Status: None   Collection Time    05/17/12 10:01 AM      Result Value Range Status   Specimen Description BONE MARROW   Final   Special Requests AFB ONLY/2CC   Final   Culture     Final   Value: CULTURE WILL BE EXAMINED FOR 6 WEEKS BEFORE ISSUING A FINAL REPORT   Report Status PENDING   Incomplete  CULTURE, BLOOD (ROUTINE X 2)     Status: None   Collection Time    05/17/12 10:01 AM      Result Value Range Status   Specimen Description BONE MARROW   Final   Special Requests BOTTLES DRAWN AEROBIC ONLY 2CC   Final   Culture  Setup Time 05/18/2012 08:37   Final   Culture     Final   Value:        BLOOD CULTURE RECEIVED NO GROWTH TO DATE CULTURE WILL BE HELD FOR 5 DAYS BEFORE ISSUING A FINAL NEGATIVE REPORT   Report Status PENDING   Incomplete  FUNGUS CULTURE, BLOOD     Status: None   Collection Time    05/17/12 10:01 AM      Result Value Range Status   Specimen Description BONE MARROW   Final   Special Requests BOTTLES DRAWN AEROBIC ONLY 2CC   Final   Culture NO FUNGUS ISOLATED;CULTURE IN PROGRESS FOR 7 DAYS   Final   Report Status PENDING   Incomplete  GRAM STAIN     Status: None   Collection Time    05/21/12  2:32 PM      Result Value Range Status   Specimen Description CSF   Final   Special Requests 5.0ML FLUID   Final   Gram Stain     Final   Value: CYTOSPIN PREP     WBC PRESENT, PREDOMINANTLY MONONUCLEAR     NO ORGANISMS SEEN   Report Status 05/21/2012 FINAL   Final  AFB CULTURE WITH SMEAR     Status: None   Collection Time    05/21/12  2:52 PM      Result Value Range Status   Specimen Description CSF   Final   Special Requests 5.0ML FLUID   Final   ACID FAST SMEAR NO ACID FAST BACILLI SEEN   Final   Culture     Final   Value: CULTURE WILL BE EXAMINED FOR 6 WEEKS BEFORE ISSUING A FINAL REPORT   Report Status PENDING   Incomplete  FUNGUS CULTURE W SMEAR     Status: None   Collection Time     05/21/12  2:52 PM      Result Value Range Status   Specimen Description CSF   Final   Special Requests 4.0ML FLUID   Final   Fungal Smear NO YEAST OR FUNGAL ELEMENTS SEEN   Final   Culture CULTURE IN PROGRESS FOR FOUR WEEKS   Final   Report Status PENDING   Incomplete  CSF CULTURE     Status: None   Collection Time    05/21/12  2:52 PM      Result Value Range Status  Specimen Description CSF   Final   Special Requests 5.0ML FLUID   Final   Gram Stain     Final   Value: CYTOSPIN WBC PRESENT, PREDOMINANTLY MONONUCLEAR     NO ORGANISMS SEEN     Performed at [facility]   Culture NO GROWTH 1 DAY   Final   Report Status PENDING   Incomplete   Studies/Results: Ct Head Wo Contrast  05/21/2012   *RADIOLOGY REPORT*  Clinical Data: Night sweats, chills, fatigue and dizziness.  HIV positive.  CT HEAD WITHOUT CONTRAST  Technique:  Contiguous axial images were obtained from the base of the skull through the vertex without contrast.  Comparison: 05/12/2012.  Findings: Faint physiologic calcifications in the left basal ganglia are unchanged.  No acute intracranial abnormalities. Specifically, no evidence of acute intracranial hemorrhage, no definite findings of acute/subacute cerebral ischemia, no mass, mass effect, hydrocephalus or abnormal intra or extra-axial fluid collections.  Visualized paranasal sinuses and mastoids are well pneumatized.  No acute displaced skull fractures are identified.  IMPRESSION: 1.  No acute intracranial abnormalities. 2.  The appearance of the brain is normal.   Original Report Authenticated By: Trudie Reed, M.D.   Dg Fluoro Guide Lumbar Puncture  05/21/2012   *RADIOLOGY REPORT*  Clinical Data:  History of left sided headache.  History of histoplasmosis.  DIAGNOSTIC LUMBAR PUNCTURE UNDER FLUOROSCOPIC GUIDANCE  Fluoroscopy time:   12 seconds  Technique:  Informed consent was obtained from the patient prior to the procedure, including potential complications of  headache, allergy, and pain.   With the patient prone, the lower back was prepped with Betadine.  1% Lidocaine was used for local anesthesia. Lumbar puncture was performed at the L4-L5 level on the left using a 20 gauge needle with return of clear CSF with an opening pressure of 17 cm water.   18 ml of CSF were obtained for laboratory studies.  The patient tolerated the procedure well and there were no apparent complications.  IMPRESSION: Successful diagnostic lumbar puncture performed by Dr. Call under intermittent pulsed fluoroscopic guidance.   Original Report Authenticated By: Onalee Hua Call   Medications: I have reviewed the patient's current medications. Scheduled Meds: . acetaminophen  650 mg Oral Q24H  . amphotericin-B lipid  5 mg/kg Intravenous Q24H  . [START ON 05/27/2012] azithromycin  1,200 mg Oral Weekly  . diphenhydrAMINE  25 mg Intravenous Q24H   Or  . diphenhydrAMINE  25 mg Oral Q24H  . feeding supplement  237 mL Oral TID BM  . sodium chloride  500 mL Intravenous Q24H   And  . sodium chloride  500 mL Intravenous Q24H  . sulfamethoxazole-trimethoprim  1 tablet Oral Daily   Continuous Infusions: . sodium chloride 150 mL/hr at 05/23/12 0447   PRN Meds:.acetaminophen, acetaminophen, meperidine (DEMEROL) injection, sodium chloride  Assessment/Plan: Patient is a 32 y.o. male with a PMHx of AIDS (CD4 < 10) with history of prior disseminated histoplasmosis in 2008, medication noncompliance and recent admission from 5/14-5/16 for FUO (thought possibly related to disseminated histoplasmosis vs disseminated TB vs MAC) who returns to [facility] as a direct admission from [facility] for recurrent fevers, volume depletion with orthostasis.  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 He was treated in 2008 for the same. Another possibility includes MAC  infection. He did not refill his azithromycin, ethambutol, and rifabutin, which had  been prescribed on his last admission due to the cost- he was to receive a 30 day supply from [facility] prior to his last discharge which would have been $11.  Results from the bone marrow biopsy performed on 5/16 are still pending, but Dr. Ninetta Lights called the Pathologist and the results show histoplasmosis. Histoplasma Ag in urine ordered, which is less sensitive compared to [facility] as noted from Dr Clinton Gallant note, so obtained LP, unsuccessful at the bedside, then done under Fluoro and sent for fungal culture and Histo Ag testing to send to [facility]. IV Amphiotericin B started after LP. LP results and blood cultures, AFB and fungal cultures are all pending. A PICC was placed on 5/21. Through the interpreter, we discussed the need for VI abx for 2-6 weeks after discharge, depending on the CSF findings. He has requested to receive his IV abx via home infusion services. Provided information regarding histoplasmosis to the pt in spanish. He remains febrile today, and is ready for d/c to home.   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 Patient was found to be orthostatic in the clinic when he was evaluated by Dr. Terri Piedra. He reports some symptoms of dizziness, especially with long-standing. Bolused 3L with NS this admission. BP remains low, but appears to be near his baseline. - NS@150    HIV/AIDS: CD4 on 5/14 less than 10. Viral load is 62,000. Patient has been off medication for about 2 years after getting lost to follow up in 2012. The timing for the initiation of his antiretroviral treatment has been deferred to ID.  - Azithromycin weekly for MAI prophylaxis.  - Bactrim daily for PCP prophylaxis.  - Histo therapy with IV Amphotericin B for 2-6 weeks pending LP results  Psoriatic-like rash on BLE and warts on hands: As discussed in  problem #1, the most likely etiology of his rash/warts is recurrence of histoplasmosis. With his bone marrow biopsy results, holding off on biopsying the lesions on his legs and hands and treating for disseminated histo after LP done.  - Plan as above  Pancytopenia: Patient has low WBC counts of 1.1. Hgb stable at 8.2 today and platelets still low at 43 today from as high as 53. Presumed from AIDS.   DVT PPx: SCDs.  Dispo: D/c to home today.  The patient does not have a current PCP (No PCP Per Patient), therefore will not be requiring OPC follow-up after discharge, will f/u with ID Clinic.   The patient does not have transportation limitations that hinder transportation to clinic appointments.  .Services Needed at time of discharge: Y = Yes, Blank = No PT:   OT:   RN:   Equipment:   Other:     LOS: 3 days   Genelle Gather 05/23/2012, 10:44 AM

## 2012-05-23 NOTE — Progress Notes (Signed)
Internal Medicine Teaching Service Attending Note Date: 05/23/2012  Patient name: David Knapp  Medical record number: [id number]  Date of birth: 10/13/1980    This patient has been seen and discussed with the house staff. Please see their note for complete details. I concur with their findings.   Mr. Ehrmann is ready to go home today, Home Care for continued Abx has been set up.  Unfortunately, Mr. Litle has a high readmission risk given his previous two admissions.  Will attempt to manage his further care with good follow up in the [facility] clinic for his HIV/AIDS and disseminated Histoplasmosis.   MULLEN, EMILY 05/23/2012, 3:02 PM

## 2012-05-23 NOTE — Plan of Care (Signed)
Problem: Phase III Progression Outcomes Goal: Other Phase III Outcomes/Goals Histoplasmosis education given in English from [facility] clinic and in Spanish found by Dr. Vinie Sill

## 2012-05-24 LAB — CULTURE, BLOOD (ROUTINE X 2): Culture: NO GROWTH

## 2012-05-24 NOTE — Progress Notes (Signed)
Resident Co-sign Daily Note: I have seen the patient and reviewed the daily progress note by [facility] MS-3 and discussed the care of the patient with her.  See my separate note for documentation of my findings, assessment, and plans.    LOS: 3 days   David Knapp 05/24/2012, 6:34 PM

## 2012-05-24 NOTE — Progress Notes (Signed)
Resident Co-sign Daily Note: I have seen the patient and reviewed the daily progress note by [facility] MS-3 and discussed the care of the patient with her.  See my separate note for documentation of my findings, assessment, and plans.    LOS: 3 days   Genelle Gather 05/24/2012, 6:32 PM

## 2012-05-25 LAB — CSF CULTURE W GRAM STAIN: Culture: NO GROWTH

## 2012-05-25 LAB — FUNGUS CULTURE, BLOOD: Culture: NO GROWTH

## 2012-05-28 ENCOUNTER — Telehealth: Payer: Self-pay | Admitting: Infectious Disease

## 2012-05-28 LAB — MISCELLANEOUS TEST

## 2012-05-28 MED ORDER — POTASSIUM CHLORIDE CRYS ER 20 MEQ PO TBCR: 40.0000 meq | EXTENDED_RELEASE_TABLET | Freq: Every day | ORAL | Status: DC

## 2012-05-28 NOTE — Telephone Encounter (Signed)
David Knapp Mr Nation's potassium is running low and I want him to take K replacement esp as long as he is getting amphotericin  I have e scripted k to [facility]  Can we make sure he gets this?  May need help with SPanish translator. I beliefve he is getting amphotericin from [facility]

## 2012-05-29 ENCOUNTER — Telehealth: Payer: Self-pay | Admitting: *Deleted

## 2012-05-29 ENCOUNTER — Ambulatory Visit: Payer: Self-pay | Admitting: Infectious Disease

## 2012-05-29 LAB — HISTOPLASMA ANTIGEN (BLD, CSF, BRONCH WASH, OTHER)

## 2012-05-29 NOTE — Telephone Encounter (Signed)
Pt no-showed today's 11:15 appointment. Pt states that he was told the appointment was at 4:00.  Pt rescheduled for tomorrow morning 5/29 at 9:45.  Pt verbalized understanding.  Pt stated he was out of the "white oral rehydration fluid" - could give no other information about the product.  RN consulted with [facility] pharmacy, they will send out normal saline today with his amphotericin delivery. Andree Coss, RN

## 2012-05-30 ENCOUNTER — Other Ambulatory Visit: Payer: Self-pay | Admitting: *Deleted

## 2012-05-30 ENCOUNTER — Telehealth: Payer: Self-pay | Admitting: *Deleted

## 2012-05-30 ENCOUNTER — Telehealth: Payer: Self-pay | Admitting: Infectious Disease

## 2012-05-30 ENCOUNTER — Ambulatory Visit (INDEPENDENT_AMBULATORY_CARE_PROVIDER_SITE_OTHER): Payer: Self-pay | Admitting: Infectious Disease

## 2012-05-30 ENCOUNTER — Encounter: Payer: Self-pay | Admitting: Infectious Disease

## 2012-05-30 VITALS — BP 109/64 | HR 83 | Temp 98.6°F | Wt 164.0 lb

## 2012-05-30 DIAGNOSIS — B394 Histoplasmosis capsulati, unspecified: Secondary | ICD-10-CM

## 2012-05-30 DIAGNOSIS — N289 Disorder of kidney and ureter, unspecified: Secondary | ICD-10-CM

## 2012-05-30 DIAGNOSIS — B20 Human immunodeficiency virus [HIV] disease: Secondary | ICD-10-CM

## 2012-05-30 LAB — BASIC METABOLIC PANEL WITHOUT GFR
BUN: 7 mg/dL (ref 6–23)
CO2: 25 meq/L (ref 19–32)
Calcium: 8.5 mg/dL (ref 8.4–10.5)
Chloride: 106 meq/L (ref 96–112)
Creat: 0.78 mg/dL (ref 0.50–1.35)
GFR, Est African American: >89 mL/min
GFR, Est Non African American: >89 mL/min
Glucose, Bld: 127 mg/dL — ABNORMAL HIGH (ref 70–99)
Potassium: 2.9 meq/L — ABNORMAL LOW (ref 3.5–5.3)
Sodium: 138 meq/L (ref 135–145)

## 2012-05-30 MED ORDER — ITRACONAZOLE 200 MG PO TABS: 200.0000 mg | ORAL_TABLET | ORAL | Status: DC

## 2012-05-30 MED ORDER — AZITHROMYCIN 600 MG PO TABS: 1200.0000 mg | ORAL_TABLET | ORAL | Status: DC

## 2012-05-30 MED ORDER — ITRACONAZOLE 200 MG PO TABS: 200.0000 mg | ORAL_TABLET | Freq: Three times a day (TID) | ORAL | Status: DC

## 2012-05-30 MED ORDER — DARUNAVIR ETHANOLATE 800 MG PO TABS: 800.0000 mg | ORAL_TABLET | Freq: Every day | ORAL | Status: DC

## 2012-05-30 MED ORDER — SULFAMETHOXAZOLE-TMP DS 800-160 MG PO TABS: 1.0000 | ORAL_TABLET | Freq: Every day | ORAL | Status: DC

## 2012-05-30 MED ORDER — EMTRICITABINE-TENOFOVIR DF 200-300 MG PO TABS: 1.0000 | ORAL_TABLET | Freq: Every day | ORAL | Status: DC

## 2012-05-30 MED ORDER — RITONAVIR 100 MG PO TABS: 100.0000 mg | ORAL_TABLET | Freq: Every day | ORAL | Status: DC

## 2012-05-30 MED ORDER — POTASSIUM CHLORIDE CRYS ER 20 MEQ PO TBCR: 40.0000 meq | EXTENDED_RELEASE_TABLET | Freq: Every day | ORAL | Status: DC

## 2012-05-30 MED ORDER — ITRACONAZOLE 100 MG PO CAPS: 100.0000 mg | ORAL_CAPSULE | Freq: Every day | ORAL | Status: DC

## 2012-05-30 NOTE — Progress Notes (Signed)
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 Organism is now grown from bone marrow biopsy. He did have a lumbar puncture showed no white blood cells and showed no evidence of central nervous system involvement.  He was started on amphotericin in house has been continued on amphotericin via advanced on care and is about to complete 2 weeks of induction therapy.  He feels improved since having been in the hospital denies having Rigors or nausea or vomiting. He has had slightly low potassium on measurement by [facility] home care.  -wise he is doing relatively well without new symptoms.  We went over his diagnosis in detail and explained why his histoplasmosis had recurred because his antivirals but stopped in his immune compromise state had been resumed. We excise it was important that he be on anti-fungal therapy for least a year with immune route constitutional. At emphasized how important it was read to be back on antiretrovirals.  I spent greater than 45 minutes with the patient including greater than 50% of time in face to face counsel of the patient re his HIV and his histoplasmosis and in coordination of their care,   Review of Systems  Constitutional: Positive for activity change. Negative for fever, chills, diaphoresis, appetite change, fatigue and unexpected weight change.  HENT: Negative for congestion, sore throat, rhinorrhea, sneezing, trouble swallowing and sinus pressure.   Eyes: Negative for photophobia and visual disturbance.  Respiratory: Negative for cough, chest tightness, shortness of breath, wheezing and stridor.   Cardiovascular: Negative for chest pain, palpitations and leg swelling.  Gastrointestinal: Negative for nausea, vomiting, abdominal pain, diarrhea, constipation, blood in stool, abdominal distention and anal bleeding.    Genitourinary: Negative for dysuria, hematuria, flank pain and difficulty urinating.  Musculoskeletal: Negative for myalgias, back pain, joint swelling, arthralgias and gait problem.  Skin: Positive for color change and rash. Negative for pallor and wound.  Neurological: Negative for dizziness, tremors, weakness and light-headedness.  Hematological: Negative for adenopathy. Does not bruise/bleed easily.  Psychiatric/Behavioral: Negative for behavioral problems, confusion, sleep disturbance, dysphoric mood, decreased concentration and agitation.     Physical Exam  Constitutional: He is oriented to person, place, and time. He appears well-nourished. No distress.  HENT:  Head: Normocephalic and atraumatic.  Mouth/Throat: Oropharynx is clear and moist. No oropharyngeal exudate.  Eyes: Conjunctivae and EOM are normal. No scleral icterus.  Neck: Normal range of motion. Neck supple.  Cardiovascular: Normal rate, regular rhythm and normal heart sounds.  Exam reveals no gallop and no friction rub.   No murmur heard. Pulmonary/Chest: Effort normal and breath sounds normal. No respiratory distress. He has no wheezes. He has no rales. He exhibits no tenderness.  Abdominal: He exhibits no distension and no mass. There is no tenderness. There is no rebound and no guarding.  Musculoskeletal: He exhibits no edema and no tenderness.  Lymphadenopathy:    He has cervical adenopathy.  Neurological: He is alert and oriented to person, place, and time. He exhibits normal muscle tone. Coordination normal.  Skin: Skin is warm and dry. Rash noted. He is not diaphoretic. No erythema. No pallor.     Psychiatric: He has a normal mood and affect. His behavior is normal. Judgment and thought content normal.      Assessment/Plan:   #1 Recurrent segmented histoplasmosis:  --Complete 14 day therapy with amphotericin --Ring back to clinic on Monday  and start itraconazole 200 mg 3 times daily or 3 days followed  by: --Itraconazole dose 200 mg daily along with his antiretrovirals.  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 We'll also continue Bactrim for PCP prophylaxis and azithromycin for Mycobacterium avium prophylaxis. He'll need recheck of his viral load a month from starting antiretrovirals.   #3 Rash: Do to a cemented histoplasmosis. 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

## 2012-05-30 NOTE — Telephone Encounter (Signed)
Call to [facility] to add potassium rx to pt profile at [facility].  Also, to alert the [facility] RN to check with the patient to make sure he is taking the medication correctly.  [facility] to add potassium to profile. Requested [facility] RN check to make sure that the pt is taking potassium rx correctly when visiting home.  HH RN agreed to add to pt med profile and check with each home visit.

## 2012-05-30 NOTE — Progress Notes (Signed)
HPI: David Knapp is a 32 y.o. male with HIV/AIDS recently admitted for disseminated histoplasmosis. He is currently being treated with amphotericin set to finish Sunday and convert to itraconazole Monday.   Allergies: No Known Allergies  Vitals: Temp: 98.6 F (37 C) (05/29 0952) Temp src: Oral (05/29 0952) BP: 109/64 mmHg (05/29 0952) Pulse Rate: 83 (05/29 4098)  Past Medical History: Past Medical History  Diagnosis Date  . HIV (human immunodeficiency virus infection)   . 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 Possible recurrence 2014 (currently being evaluated 05/2012)  . Pancytopenia     Social History: History   Social History  . Marital Status: Single    Spouse Name: N/A    Number of Children: 0  . Years of Education: 6th grade   Occupational History  . Landscaping    Social History Main Topics  . Smoking status: Never Smoker   . Smokeless tobacco: Never Used  . Alcohol Use: 3.0 oz/week    6 drink(s) per week  . Drug Use: No  . Sexually Active: No     Comment: given condoms   Other Topics Concern  . None   Social History Narrative   Lives in [place] with his wife and 3 nephews/nieces. Is spanish speaking only - requiring interpretor. He is able to read and write in Spanish only    Previous Regimen: Atripla, atazanavir, ritonavir, Truvada  Current Regimen: Off treatment since 2012  Labs: HIV 1 RNA Quant (copies/mL)  Date Value  05/12/2012 62399*  07/18/2010 <20   04/08/2009 <48 copies/mL      CD4 T Cell Abs (cmm)  Date Value  05/15/2012 <10*  05/12/2012 <10*  07/18/2010 310*     Hep B S Ab (no units)  Date Value  09/18/2006 NEG      Hepatitis B Surface Ag (no units)  Date Value  05/15/2012 NEGATIVE      HCV Ab (no units)  Date Value  05/15/2012 NEGATIVE     CrCl: The CrCl is unknown because both a height and weight (above a minimum accepted value) are required for this calculation.  Lipids:    Component Value  Date/Time   CHOL 167 01/23/2008 2135   TRIG 471* 01/23/2008 2135   HDL 34* 01/23/2008 2135   CHOLHDL 4.9 Ratio 01/23/2008 2135   VLDL NOT CALC mg/dL 01/21/1476 2956   LDLCALC See Comment mg/dL 02/15/863 7846    Assessment: Patient will need to start itraconazole once amphotericin finishes on 06/01. Spoke with Dr. Algis Liming about treat options given itraconazole interactions and patient's resistance mutations present in 2009 (K103N,V106M, M184V ). It was decided to hold off of ART until after patient receives his 3 day itraconazole load and then start a reduced maintenace dose of itraconazole given the interactions. Patient is returning to clinic Monday at which time I will go over his medications with him.  Recommendations: - Continue Bactrim daily, and azithromycin weekly - Start itraconazole 200mg  three times daily for 3 days starting 06/02 - Start Darunavir, ritonavir, Truvada, and itraconazole 200mg  all once daily starting 06/05 - Would measure itraconazole level 1-2 weeks after starting maintenance dose  Drue Stager, PharmD [facility] for Infectious Disease 05/30/2012, 1:26 PM

## 2012-05-30 NOTE — Telephone Encounter (Signed)
Spoke with [facility] and [facility] RN.  Let them know that pt was started on potassium, to add potassium to pt's med profile at [facility] and for the [facility] RN to check w/ the pt weekly when she changes his PICC dressing about taking the potassium rx.

## 2012-05-30 NOTE — Telephone Encounter (Signed)
David Knapp or David Knapp can you have [facility] check on David Knapp and make sure that we get his K repleted.   It was down to 2.9  I had written rx for Kdur for the pt but I dont know if he picked up the meds. I also suspect his Magnesium is low  Can we get [facility] to give him the following each day IV in addition to the KCL oral that I have rx him  KCL 60 meq and 2 grams of MGSO4 IV?

## 2012-05-31 ENCOUNTER — Other Ambulatory Visit: Payer: Self-pay | Admitting: *Deleted

## 2012-05-31 DIAGNOSIS — B394 Histoplasmosis capsulati, unspecified: Secondary | ICD-10-CM

## 2012-05-31 LAB — CBC WITH DIFFERENTIAL/PLATELET
Basophils Absolute: 0 10*3/uL (ref 0.0–0.1)
Eosinophils Absolute: 0 10*3/uL (ref 0.0–0.7)
Eosinophils Relative: 2 % (ref 0–5)
HCT: 24.7 % — ABNORMAL LOW (ref 39.0–52.0)
MCH: 25.9 pg — ABNORMAL LOW (ref 26.0–34.0)
MCHC: 33.6 g/dL (ref 30.0–36.0)
MCV: 76.9 fL — ABNORMAL LOW (ref 78.0–100.0)
Monocytes Absolute: 0.1 10*3/uL (ref 0.1–1.0)
Platelets: 58 10*3/uL — ABNORMAL LOW (ref 150–400)
RDW: 17.2 % — ABNORMAL HIGH (ref 11.5–15.5)
WBC: 1.7 10*3/uL — ABNORMAL LOW (ref 4.0–10.5)

## 2012-05-31 MED ORDER — ITRACONAZOLE 100 MG PO CAPS: 100.0000 mg | ORAL_CAPSULE | Freq: Every day | ORAL | Status: DC

## 2012-05-31 NOTE — Telephone Encounter (Signed)
[facility] nurse, Lyn notified. Wendall Mola

## 2012-05-31 NOTE — Telephone Encounter (Signed)
Do you want this ordered in the same bag with 2 liters of normal saline over a 6 hour period, which is how long the antibiotics take? David Knapp

## 2012-05-31 NOTE — Telephone Encounter (Signed)
Excellent

## 2012-05-31 NOTE — Telephone Encounter (Signed)
Annice Pih, would you be able to do this, since [place] and I are with Travel today?  Thank you so much!  Marcelino Duster

## 2012-05-31 NOTE — Telephone Encounter (Signed)
Do you want a repeat BMP and how many times does the IV K+ and mag need to be administered?

## 2012-05-31 NOTE — Telephone Encounter (Signed)
Lets keep giving the K, Mag for the next few days. They can repeat a BMP tomorrow or Sunday but we will see pt back in Monday already

## 2012-05-31 NOTE — Telephone Encounter (Signed)
That would be GREAT!. They should be able to add in the kcl to the NS easily, mg might be an extra infusion

## 2012-06-01 NOTE — Discharge Summary (Signed)
Internal Medicine Teaching [facility] Discharge Note  Name: David Knapp MRN: [id number] DOB: Nov 25, 1980 32 y.o.  Date of Admission: 05/20/2012  6:33 PM Date of Discharge: 06/01/2012 Attending Physician: No att. providers found  Discharge Diagnosis: Principal Problem:   Fever Active Problems:   HIV DISEASE   WARTS, BOTH HANDS   HISTOPLASMOSIS, DISSEMINATED   Pancytopenia   Orthostatic hypotension   Discharge Medications:   Medication List    STOP taking these medications       azithromycin 500 MG tablet  Commonly known as:  ZITHROMAX     ethambutol 400 MG tablet  Commonly known as:  MYAMBUTOL     rifabutin 150 MG capsule  Commonly known as:  MYCOBUTIN     sulfamethoxazole-trimethoprim 800-160 MG per tablet  Commonly known as:  BACTRIM DS      TAKE these medications       acetaminophen 325 MG tablet  Commonly known as:  TYLENOL  Take 1 tablet (325 mg total) by mouth every 6 (six) hours as needed.     dextrose 5 % SOLN 250 mL with amphotericin B lipid 5 MG/ML SUSP 355 mg  Inject 355 mg into the vein daily.        Disposition and follow-up:   Mr.David Knapp was discharged from [facility] in Stable condition.  At the hospital follow up visit please address  - Medication compliance - Monitor his blood pressure, as he was hypotensive this admission - CBC to monitor his pancytopenia  Follow-up Appointments:     Follow-up Information   Follow up with Acey Lav, MD On 05/29/2012. (At 11:15am)    Contact information:   [address] [place] [address] [phone]      Discharge Orders   Future Appointments Provider Department Dept Phone   06/03/2012 11:15 AM Randall Hiss, MD [facility] for Infectious Disease [phone]   Future Orders Complete By Expires     Call MD for:  extreme fatigue  As directed     Call MD for:  persistant dizziness or light-headedness  As directed      Call MD for:  redness, tenderness, or signs of infection (pain, swelling, redness, odor or green/yellow discharge around incision site)  As directed     Call MD for:  temperature >100.4  As directed     Diet - low sodium heart healthy  As directed     Discharge instructions  As directed     Comments:      It is ok to work, but be sure to wrap your arm at the site of the IV to prevent pulling the IV out.    Increase activity slowly  As directed        Consultations:  Infectious Disease  Procedures Performed:  Dg Chest 2 View  05/15/2012   *RADIOLOGY REPORT*  Clinical Data: Fever, chills.  CHEST - 2 VIEW  Comparison: 05/12/2012  Findings: Lungs are predominately clear. No pleural effusion or pneumothorax. The cardiomediastinal contours are within normal limits. The visualized bones and soft tissues are without significant appreciable abnormality.  IMPRESSION: No radiographic evidence of acute cardiopulmonary process.   Original Report Authenticated By: Jearld Lesch, M.D.   Dg Chest 2 View  05/12/2012   *RADIOLOGY REPORT*  Clinical Data: Fever and cough.  Right-sided chest pain.  CHEST - 2 VIEW  Comparison: 07/09/2006  Findings: The lungs are clear and show no  evidence of infiltrate, edema, nodule or pleural fluid.  Cardiac and mediastinal contours within normal limits.  The bony thorax is unremarkable.  IMPRESSION: Normal chest.  No active disease identified.   Original Report Authenticated By: Irish Lack, M.D.   Ct Head Wo Contrast  05/21/2012   *RADIOLOGY REPORT*  Clinical Data: Night sweats, chills, fatigue and dizziness.  HIV positive.  CT HEAD WITHOUT CONTRAST  Technique:  Contiguous axial images were obtained from the base of the skull through the vertex without contrast.  Comparison: 05/12/2012.  Findings: Faint physiologic calcifications in the left basal ganglia are unchanged.  No acute intracranial abnormalities. Specifically, no evidence of acute intracranial hemorrhage, no  definite findings of acute/subacute cerebral ischemia, no mass, mass effect, hydrocephalus or abnormal intra or extra-axial fluid collections.  Visualized paranasal sinuses and mastoids are well pneumatized.  No acute displaced skull fractures are identified.  IMPRESSION: 1.  No acute intracranial abnormalities. 2.  The appearance of the brain is normal.   Original Report Authenticated By: Trudie Reed, M.D.   Ct Head Wo Contrast  05/12/2012   *RADIOLOGY REPORT*  Clinical Data: Headache, HIV and pancytopenia.  CT HEAD WITHOUT CONTRAST  Technique:  Contiguous axial images were obtained from the base of the skull through the vertex without contrast.  Comparison: 05/03/2005  Findings: The brain demonstrates no evidence of hemorrhage, infarction, edema, mass effect, extra-axial fluid collection, hydrocephalus or mass lesion.  The skull is unremarkable.  IMPRESSION: Normal head CT.   Original Report Authenticated By: Irish Lack, M.D.   Ct Biopsy  05/17/2012   *RADIOLOGY REPORT*  Clinical Data/Indication: PANCYTOPENIA  CT-GUIDED BONE MARROW ASPIRATE, CORE, AND CULTURE  Sedation: Versed 2.0 mg, Fentanyl 100 mcg.  Total Moderate Sedation Time: 15 minutes.  Procedure: The procedure, risks, benefits, and alternatives were explained to the patient. Questions regarding the procedure were encouraged and answered. The patient understands and consents to the procedure.  The back was prepped with betadine in a sterile fashion, and a sterile drape was applied covering the operative field. A sterile mask and sterile gloves were used for the procedure.  Under CT guidance, 11 gauge needle was inserted into the right iliac bone via posterior approach.  Aspirates were obtained.  One aspirate was obtained for culture.  A core was obtained.  Final imaging was performed.  Patient tolerated the procedure well without complication.  Vital sign monitoring by nursing staff during the procedure will continue as patient is in the  special procedures unit for post procedure observation.  Findings: The images document guide needle placement within the right iliac bone via posterior approach.  Post biopsy images demonstrate no hemorrhage.  IMPRESSION: Successful CT-guided bone marrow aspirate and core as well as culture.   Original Report Authenticated By: Jolaine Click, M.D.   Dg Fluoro Guide Lumbar Puncture  05/21/2012   *RADIOLOGY REPORT*  Clinical Data:  History of left sided headache.  History of histoplasmosis.  DIAGNOSTIC LUMBAR PUNCTURE UNDER FLUOROSCOPIC GUIDANCE  Fluoroscopy time:   12 seconds  Technique:  Informed consent was obtained from the patient prior to the procedure, including potential complications of headache, allergy, and pain.   With the patient prone, the lower back was prepped with Betadine.  1% Lidocaine was used for local anesthesia. Lumbar puncture was performed at the L4-L5 level on the left using a 20 gauge needle with return of clear CSF with an opening pressure of 17 cm water.   18 ml of CSF were obtained for laboratory studies.  The patient tolerated the procedure well and there were no apparent complications.  IMPRESSION: Successful diagnostic lumbar puncture performed by Dr. Call under intermittent pulsed fluoroscopic guidance.   Original Report Authenticated By: Onalee Hua Call    Admission  History of Present Illness:  Patient is a 32 y.o. male with a PMHx of AIDS (CD4 < 10) with history of prior disseminated histoplasmosis in 2008, medication noncompliance and recent admission from 5/14-5/16 for FUO (thought possibly related to disseminated histoplasmosis vs disseminated TB vs MAC) who represents to [facility] as a direct admission from [facility] for recurrent fevers, volume depletion with orthostasis. The ongoing investigation involves evaluation of 2 year history of rash involving his hands bilaterally.  Since last hospital discharge on 5/16, he started to develop intermittent episodes of subjective high fevers on  the day after discharge - these fevers specifically are worse at night and are associated with chills and nocturnal sweating (although not florid drenching sweats). Has been getting increasingly fatigued, and also reports symptoms of dizziness with prolonged standing. Denies symptoms of syncope or loss of consciousness. His appetite has been normal. He does not have nausea or vomiting. No recent sick contacts.  He was unable to refill his medications for the treatment of MAC due to cost.  Review of Systems:  Constitutional:  admits to fever, chills, diaphoresis, and fatigue.  Denies appetite change.   HEENT:  denies nasal congestion, sore throat, neck stiffness, changes in vision,   Respiratory:  denies SOB, DOE, cough, chest tightness, and wheezing.   Cardiovascular:  denies chest pain, palpitations and leg swelling. But recently he has noticed dizziness when he stands up for a long time. No passing out.   Gastrointestinal:  denies nausea, vomiting, abdominal pain, diarrhea.   Genitourinary:  denies dysuria, urgency, frequency, hematuria, flank pain and difficulty urinating.   Musculoskeletal:  denies myalgias, back pain, joint swelling, arthralgias and gait problem.   Skin:  admits to rash on his arms for the last two years. No open wound. No urethral discharge.   Neurological:  denies seizures, syncope, weakness, light-headedness, numbness and headaches.   Hematological:  denies adenopathy, easy bruising, personal or family bleeding history.   Psychiatric/  Behavioral:  denies suicidal ideation, mood changes, confusion, nervousness, sleep disturbance and agitation.    Vital Signs:  Blood pressure 95/54, pulse 98, temperature 101.7 F (38.7 C), temperature source Oral, resp. rate 20, height 5\' 7"  (1.702 m), SpO2 96.00%.  Physical Exam:  General:  Vital signs reviewed and noted. Interpreter at beside. Well-developed, well-nourished, in no acute distress; alert, appropriate and cooperative  throughout examination. Febrile to touch. No palpable lymph nodes.   Head:  Normocephalic, atraumatic.   Eyes:  PERRL, EOMI, No signs of anemia or jaundince.   Nose:  Mucous membranes moist, not inflammed, nonerythematous.   Throat:  Oropharynx nonerythematous, no exudate appreciated. No oral thrush   Neck:  No deformities, masses, or tenderness noted.Supple, No carotid Bruits, no JVD.   Lungs:  Normal respiratory effort. Clear to auscultation BL without crackles or wheezes.   Heart:  RRR. S1 and S2 normal without gallop, murmur, or rubs.   Abdomen:  BS normoactive. Soft, Nondistended, non-tender. No masses or organomegaly.   Extremities:  No pretibial edema.   Neurologic:  A&O X3, CN II - XII are grossly intact. Motor strength is 5/5 in the all 4 extremities, Sensations intact to light touch, Cerebellar signs negative.   Skin:  Multiple pedunculated lesions on his hands on both palmar  and dorsal aspects, but also on his lips, forehead and hyperpigmented ones on his arms, he has scaling rash on legs left thigh and calf. No open ulcers.     Hospital Course by problem list: Patient is a 32 y.o. male with a PMHx of AIDS (CD4 < 10) with history of prior disseminated histoplasmosis in 2008, medication noncompliance and recent admission from 5/14-5/16 for FUO (thought possibly related to disseminated histoplasmosis vs disseminated TB vs MAC) who returns to [facility] as a direct admission from [facility] for recurrent fevers, volume depletion with orthostasis.   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 He was treated in 2008 for the same. Another possibility includes MAC infection. He did not refill his azithromycin, ethambutol, and rifabutin, which had been prescribed on his last admission due to the cost- he was to receive a 30 day supply from [facility] prior to his  last discharge which would have been $11. Results from the bone marrow biopsy performed on 5/16 are still pending, but Dr. Ninetta Lights called the Pathologist and the results show histoplasmosis. Histoplasma Ag in urine ordered, which is less sensitive compared to [facility] as noted from Dr Clinton Gallant note, so obtained LP, unsuccessful at the bedside, then done under Fluoro and sent for fungal culture and Histo Ag testing to send to [facility]. IV Amphiotericin B started after LP. LP was positive for histoplasmosis. Blood cultures, AFB, and fungal cultures are all pending. A PICC was placed on 5/21. Through the interpreter, we discussed the need for VI abx for 2-6 weeks after discharge, depending on the CSF findings. He has requested to receive his IV abx via home infusion services. Provided information regarding histoplasmosis to the pt in spanish. He remains afebrile today, was deemed appropriate for d/c to home with home health services for IV abx infusion.  - Amphotericin B 5mg /kg IV q24h, day #5, willneed for 6 weeks due to postive CSF findings.  - F/u Bcx, AFB cx, fungal cx   Orthostatic Hypotension: BP low at baseline. Patient was found to be orthostatic in the clinic when he was evaluated by Dr. Terri Piedra. He reports some symptoms of dizziness, especially with long-standing. Bolused 3L with NS this admission. BP remains low, but appears to be near his baseline. This will need to be monitored as an outpatient.  HIV/AIDS: CD4 on 5/14 less than 10. Viral load is 62,000. Patient has been off medication for about 2 years after getting lost to follow up in 2012. The timing for the initiation of his antiretroviral treatment has been deferred to ID.  - Azithromycin weekly for MAI prophylaxis.  - Bactrim daily for PCP prophylaxis.  - Histo therapy with IV Amphotericin B for 6 weeks 2/2 LP results   Psoriatic-like rash on BLE and warts on hands: As discussed in problem #1, the most likely etiology of his  rash/warts is recurrence of histoplasmosis. With his bone marrow biopsy results, holding off on biopsying the lesions on his legs and hands and treating for disseminated histoplasmosis.  - Plan as above   Pancytopenia: Patient has low WBC counts of 1.1. Hgb stable at 8.2 on 5/22 and platelets still low at 43 on 5/22 from as high as 53. Presumed from AIDS.      Discharge Vitals:  BP 94/56  Pulse 80  Temp(Src) 98.2 F (36.8 C) (Oral)  Resp 18  Ht 5\' 7"  (1.702 m)  Wt 156 lb 8.4 oz (71 kg)  BMI 24.51 kg/m2  SpO2 98%  Discharge Labs: No results found for this or any previous visit (from the past 24 hour(s)).  Signed: Genelle Gather 06/01/2012, 2:16 PM   Time Spent on Discharge: Services Ordered on Discharge: None Equipment Ordered on Discharge: None

## 2012-06-03 ENCOUNTER — Encounter: Payer: Self-pay | Admitting: Infectious Disease

## 2012-06-03 ENCOUNTER — Ambulatory Visit (INDEPENDENT_AMBULATORY_CARE_PROVIDER_SITE_OTHER): Payer: Self-pay | Admitting: Infectious Disease

## 2012-06-03 VITALS — BP 123/72 | HR 86 | Temp 98.5°F | Wt 166.0 lb

## 2012-06-03 DIAGNOSIS — D61818 Other pancytopenia: Secondary | ICD-10-CM

## 2012-06-03 DIAGNOSIS — B399 Histoplasmosis, unspecified: Secondary | ICD-10-CM

## 2012-06-03 DIAGNOSIS — N289 Disorder of kidney and ureter, unspecified: Secondary | ICD-10-CM

## 2012-06-03 DIAGNOSIS — B20 Human immunodeficiency virus [HIV] disease: Secondary | ICD-10-CM

## 2012-06-03 DIAGNOSIS — E876 Hypokalemia: Secondary | ICD-10-CM

## 2012-06-03 DIAGNOSIS — R21 Rash and other nonspecific skin eruption: Secondary | ICD-10-CM

## 2012-06-03 NOTE — Progress Notes (Signed)
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 Organism is now grown from bone marrow biopsy. He did have a lumbar puncture showed no white blood cells and showed no evidence of central nervous system involvement.  He was started on amphotericin in house has been continued on amphotericin via advanced on care and is about to complete 2 weeks of induction therapy.  He feels improved since having been in the hospital denies having Rigors or nausea or vomiting. He has had slightly low potassium on measurement by advanced home care.  -wise he is doing relatively well without new symptoms.  We went over his diagnosis in detail and explained why his histoplasmosis had recurred because his antivirals but stopped in his immune compromise state had been resumed. We emphazied that was critical that he be on anti-fungal therapy for least a year with immune route reconstituion with lifelong ARV. .    I spent greater than 45 minutes with the patient including greater than 50% of time in face to face counsel of the patient re his HIV and his histoplasmosis and in coordination of their care,   Review of Systems  Constitutional: Positive for activity change. Negative for fever, chills, diaphoresis, appetite change, fatigue and unexpected weight change.  HENT: Negative for congestion, sore throat, rhinorrhea, sneezing, trouble swallowing and sinus pressure.   Eyes: Negative for photophobia and visual disturbance.  Respiratory: Negative for cough, chest tightness, shortness of breath, wheezing and stridor.   Cardiovascular: Negative for chest pain, palpitations and leg swelling.  Gastrointestinal: Negative for nausea, vomiting, abdominal pain, diarrhea, constipation, blood in stool, abdominal distention and anal bleeding.  Genitourinary: Negative for dysuria,  hematuria, flank pain and difficulty urinating.  Musculoskeletal: Negative for myalgias, back pain, joint swelling, arthralgias and gait problem.  Skin: Positive for color change and rash. Negative for pallor and wound.  Neurological: Negative for dizziness, tremors, weakness and light-headedness.  Hematological: Negative for adenopathy. Does not bruise/bleed easily.  Psychiatric/Behavioral: Negative for behavioral problems, confusion, sleep disturbance, dysphoric mood, decreased concentration and agitation.     Physical Exam  Constitutional: He is oriented to person, place, and time. He appears well-nourished. No distress.  HENT:  Head: Normocephalic and atraumatic.  Mouth/Throat: Oropharynx is clear and moist. No oropharyngeal exudate.  Eyes: Conjunctivae and EOM are normal. No scleral icterus.  Neck: Normal range of motion. Neck supple.  Cardiovascular: Normal rate, regular rhythm and normal heart sounds.  Exam reveals no gallop and no friction rub.   No murmur heard. Pulmonary/Chest: Effort normal and breath sounds normal. No respiratory distress. He has no wheezes. He has no rales. He exhibits no tenderness.  Abdominal: He exhibits no distension and no mass. There is no tenderness. There is no rebound and no guarding.  Musculoskeletal: He exhibits no edema and no tenderness.  Lymphadenopathy:    He has cervical adenopathy.  Neurological: He is alert and oriented to person, place, and time. He exhibits normal muscle tone. Coordination normal.  Skin: Skin is warm and dry. Rash noted. He is not diaphoretic. No erythema. No pallor.     Psychiatric: He has a normal mood and affect. His behavior is normal. Judgment and thought content normal.      Assessment/Plan:   #1 Recurrent segmented histoplasmosis:  --Completed 14 day therapy with amphotericin -- itraconazole 200 mg 3 times daily or 3 days followed by this  week  Then  --Itraconazole dose 200 mg daily along with his  antiretrovirals.  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 We'll also continue Bactrim for PCP prophylaxis and azithromycin for Mycobacterium avium prophylaxis. He'll need recheck of his viral load a month from starting antiretrovirals.   #3 Rash: Do to disseminated histoplasmosis. Certainly some of the lesions hand would also look consistent with molluscum but I think this is all disseminated histoplasmosis  #4 Pancytopenia: Due to disseminated histoplasmosis  #5 desire to return to work: I wrote letter saying I am ok with him returning to work if he feels well enough to do so. He may consider wearing a maske given his profound immune suppression and exposure to soil  #6 Hypokalemia: resolved to 3.1, and will improve with ampho gone

## 2012-06-03 NOTE — Progress Notes (Signed)
HPI: David Knapp is a 32 y.o. male with HIV/AIDS and disseminated histoplasmosis. He has finished his amphoteracin treatment as of today and presents for initiation of itraconazole and ART.  Allergies: No Known Allergies  Vitals: Temp: 98.5 F (36.9 C) (06/02 1115) Temp src: Oral (06/02 1115) BP: 123/72 mmHg (06/02 1115) Pulse Rate: 86 (06/02 1115)  Past Medical History: Past Medical History  Diagnosis Date  . HIV (human immunodeficiency virus infection)   . 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 Possible recurrence 2014 (currently being evaluated 05/2012)  . Pancytopenia     Social History: History   Social History  . Marital Status: Single    Spouse Name: N/A    Number of Children: 0  . Years of Education: 6th grade   Occupational History  . Landscaping    Social History Main Topics  . Smoking status: Never Smoker   . Smokeless tobacco: Never Used  . Alcohol Use: 3.0 oz/week    6 drink(s) per week  . Drug Use: No  . Sexually Active: No     Comment: declined condoms   Other Topics Concern  . None   Social History Narrative   Lives in [place] with his wife and 3 nephews/nieces. Is spanish speaking only - requiring interpretor. He is able to read and write in Spanish only   Previous Regimen:  Atripla, atazanavir, ritonavir, Truvada   Current Regimen:  Off treatment since 2012   Labs: HIV 1 RNA Quant (copies/mL)  Date Value  05/12/2012 62399*  07/18/2010 <20   04/08/2009 <48 copies/mL      CD4 T Cell Abs (cmm)  Date Value  05/15/2012 <10*  05/12/2012 <10*  07/18/2010 310*     Hep B S Ab (no units)  Date Value  09/18/2006 NEG      Hepatitis B Surface Ag (no units)  Date Value  05/15/2012 NEGATIVE      HCV Ab (no units)  Date Value  05/15/2012 NEGATIVE     CrCl: The CrCl is unknown because both a height and weight (above a minimum accepted value) are required for this calculation.  Lipids:    Component Value  Date/Time   CHOL 167 01/23/2008 2135   TRIG 471* 01/23/2008 2135   HDL 34* 01/23/2008 2135   CHOLHDL 4.9 Ratio 01/23/2008 2135   VLDL NOT CALC mg/dL 07/01/5282 1324   LDLCALC See Comment mg/dL 04/03/270 5366    Assessment: I went over patient's medication regimen with him with the assistance of a translator. We filled his med box and went over his regimen multiple times. He will start itraconazole 200mg  tid with meals x 3 days today. Once finished he will begin ART and itraconazole 200mg  once daily (dose reduced for interactions).  Recommendations: Continue Bactrim daily, and azithromycin weekly  - Start itraconazole 200mg  three times daily for 3 days starting today 06/02  - Start Darunavir, ritonavir, Truvada, and itraconazole 200mg  all once daily with breakfast starting 06/05  - Would measure itraconazole level at next visit in 2 weeks   Drue Stager, PharmD [facility] 06/03/2012, 1:30 PM

## 2012-06-16 NOTE — ED Provider Notes (Signed)
See prior note  Medical screening examination/treatment/procedure(s) were conducted as a shared visit with non-physician practitioner(s) and myself.  I personally evaluated the patient during the encounter  Devoria Albe, MD, Franz Dell, MD 06/16/12 2120

## 2012-06-17 ENCOUNTER — Ambulatory Visit: Payer: Self-pay | Admitting: Infectious Disease

## 2012-06-18 ENCOUNTER — Telehealth: Payer: Self-pay | Admitting: *Deleted

## 2012-06-18 LAB — FUNGUS CULTURE W SMEAR: Fungal Smear: NONE SEEN

## 2012-06-18 NOTE — Telephone Encounter (Signed)
Notified patient of missed appointment yesterday.  He stated he forgot about it.  I overbooked pt with Dr. Daiva Eves for 06/19/12 at 8:45 d/t importance of itraconazole and initiation of ART follow up.  Pt verbalized understanding, will be here tomorrow. Andree Coss, RN

## 2012-06-18 NOTE — Telephone Encounter (Signed)
That's fine. I am also booked with a research pt at 845.

## 2012-06-19 ENCOUNTER — Ambulatory Visit (INDEPENDENT_AMBULATORY_CARE_PROVIDER_SITE_OTHER): Payer: Self-pay | Admitting: Infectious Disease

## 2012-06-19 ENCOUNTER — Encounter: Payer: Self-pay | Admitting: Infectious Disease

## 2012-06-19 VITALS — BP 122/73 | HR 73 | Temp 98.1°F | Wt 170.0 lb

## 2012-06-19 DIAGNOSIS — L989 Disorder of the skin and subcutaneous tissue, unspecified: Secondary | ICD-10-CM

## 2012-06-19 DIAGNOSIS — B399 Histoplasmosis, unspecified: Secondary | ICD-10-CM

## 2012-06-19 DIAGNOSIS — D61818 Other pancytopenia: Secondary | ICD-10-CM

## 2012-06-19 DIAGNOSIS — B20 Human immunodeficiency virus [HIV] disease: Secondary | ICD-10-CM

## 2012-06-19 DIAGNOSIS — E876 Hypokalemia: Secondary | ICD-10-CM

## 2012-06-19 LAB — COMPLETE METABOLIC PANEL WITH GFR
ALT: 18 U/L (ref 0–53)
AST: 26 U/L (ref 0–37)
Albumin: 3.8 g/dL (ref 3.5–5.2)
CO2: 22 mEq/L (ref 19–32)
Calcium: 9 mg/dL (ref 8.4–10.5)
Chloride: 105 mEq/L (ref 96–112)
Creat: 0.9 mg/dL (ref 0.50–1.35)
GFR, Est African American: >89 mL/min
Potassium: 4.1 mEq/L (ref 3.5–5.3)
Total Protein: 8.3 g/dL (ref 6.0–8.3)

## 2012-06-19 NOTE — Patient Instructions (Addendum)
Please also make appt with the pharmacist in the next few weeks  Blood work today  Blood work in  One month  And appt with Dr. Daiva Eves in 5-6 weeks

## 2012-06-19 NOTE — Progress Notes (Signed)
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 Organism is now grown from bone marrow biopsy. He did have a lumbar puncture showed no white blood cells and showed no evidence of central nervous system involvement.  He was started on amphotericin in house has been continued on amphotericin via advanced on care and is about to complete 2 weeks of induction therapy.  He felet improved since having been in the hospital when I saw him last and we switched to loading dose itraconazole and daily itraconazole of 200mg  based on drug interactions with antiretrovirals.  Since I saw His spinal fluid x-rays come back positive for histoplasma antigen. 5 known as I would've inducing within a total of 4-6 weeks of amphotericin. However days without headaches and is feeling well and skin lesions are improving so I feel with his clinical improvement I do not feel obligated to reinduce them with a 4 to six week course of amphotericin.  He did not bring his pill box within the seat week which we had filled with her pharmacist with him. I did go over with a translator all of his antiretroviral medications along with his itraconazole his prophylactic  bactrim and azithromycin. He appears to be taking all the medications although would agree to prefer for him to have brought medications in person. I also reviewed his wife's labs including her rapid test or a click which was negative and her HIV RNA which was negative. They have not been sexual active for more than 4 months I don't feel that she needs retesting at this point time but can be retested in 4 months time.  I spent greater than 45 minutes with the patient including greater than 50% of time in face to face counsel of the patient re his HIV and his histoplasmosis and in coordination of their care with Spanish translator  over phone and then in person   Review of Systems  Constitutional: Positive for activity change. Negative for fever, chills, diaphoresis, appetite change, fatigue and unexpected weight change.  HENT: Negative for congestion, sore throat, rhinorrhea, sneezing, trouble swallowing and sinus pressure.   Eyes: Negative for photophobia and visual disturbance.  Respiratory: Negative for cough, chest tightness, shortness of breath, wheezing and stridor.   Cardiovascular: Negative for chest pain, palpitations and leg swelling.  Gastrointestinal: Negative for nausea, vomiting, abdominal pain, diarrhea, constipation, blood in stool, abdominal distention and anal bleeding.  Genitourinary: Negative for dysuria, hematuria, flank pain and difficulty urinating.  Musculoskeletal: Negative for myalgias, back pain, joint swelling, arthralgias and gait problem.  Skin: Positive for color change and rash. Negative for pallor and wound.  Neurological: Negative for dizziness, tremors, weakness and light-headedness.  Hematological: Negative for adenopathy. Does not bruise/bleed easily.  Psychiatric/Behavioral: Negative for behavioral problems, confusion, sleep disturbance, dysphoric mood, decreased concentration and agitation.     Physical Exam  Constitutional: He is oriented to person, place, and time. He appears well-nourished. No distress.  HENT:  Head: Normocephalic and atraumatic.  Mouth/Throat: Oropharynx is clear and moist. No oropharyngeal exudate.  Eyes: Conjunctivae and EOM are normal. No scleral icterus.  Neck: Normal range of motion. Neck supple.  Cardiovascular: Normal rate, regular rhythm and normal heart sounds.  Exam reveals no gallop and no friction rub.   No murmur heard. Pulmonary/Chest: Effort normal and breath sounds normal. No respiratory distress. He has no wheezes. He has no  rales. He exhibits no tenderness.  Abdominal: He exhibits no distension and no mass. There is no tenderness. There  is no rebound and no guarding.  Musculoskeletal: He exhibits no edema and no tenderness.  Lymphadenopathy:    He has cervical adenopathy.  Neurological: He is alert and oriented to person, place, and time. He exhibits normal muscle tone. Coordination normal.  Skin: Skin is warm and dry. Rash noted. He is not diaphoretic. No erythema. No pallor.     Psychiatric: He has a normal mood and affect. His behavior is normal. Judgment and thought content normal.      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 He is a lumbar puncture however had not had elevated white cells. The histoplasma antigen on spinal fluid came back but many weeks later after regard he finished a two-week induction. In transition to itraconazole. As he is doing well clinically I see no reason to reinduce with amphotericin but continue rather with his current itraconazole therapy. a  #2 HIV/AIDS: Continue t Prezista Norvir and Truvada on fourth day of his itraconazole therapy, he does have a 103 as well as a 184V mutation I would prefer him to be on a protease inhibitor-based regimen. We'll also continue Bactrim for PCP prophylaxis and azithromycin for Mycobacterium avium prophylaxis. I will recheck of his viral load today and in a month   #3 Rash: Do to disseminated histoplasmosis. Certainly some of the lesions hand would also look consistent with molluscum but I think this is all disseminated histoplasmosis, and they are IMPROVED today  #4 Pancytopenia: Due to disseminated histoplasmosis recheck labs  #6 Hypokalemia: He is still taking potassium replacement mass in the stop this we'll recheck a metabolic panel today. r

## 2012-06-20 ENCOUNTER — Telehealth: Payer: Self-pay | Admitting: *Deleted

## 2012-06-20 LAB — CBC WITH DIFFERENTIAL/PLATELET
Basophils Relative: 1 % (ref 0–1)
Eosinophils Absolute: 0.1 10*3/uL (ref 0.0–0.7)
Eosinophils Relative: 6 % — ABNORMAL HIGH (ref 0–5)
Lymphs Abs: 0.6 10*3/uL — ABNORMAL LOW (ref 0.7–4.0)
MCH: 27.3 pg (ref 26.0–34.0)
MCHC: 33.2 g/dL (ref 30.0–36.0)
MCV: 82.1 fL (ref 78.0–100.0)
Neutrophils Relative %: 39 % — ABNORMAL LOW (ref 43–77)
Platelets: 75 10*3/uL — ABNORMAL LOW (ref 150–400)
RBC: 4.14 MIL/uL — ABNORMAL LOW (ref 4.22–5.81)
RDW: 22.9 % — ABNORMAL HIGH (ref 11.5–15.5)

## 2012-06-20 LAB — HIV-1 RNA ULTRAQUANT REFLEX TO GENTYP+
HIV 1 RNA Quant: 236 copies/mL — ABNORMAL HIGH (ref <20)
HIV-1 RNA Quant, Log: 2.37 {Log} — ABNORMAL HIGH (ref <1.30)

## 2012-06-20 LAB — T-HELPER CELL (CD4) - (RCID CLINIC ONLY): CD4 T Cell Abs: 20 uL — ABNORMAL LOW (ref 400–2700)

## 2012-06-20 NOTE — Telephone Encounter (Signed)
Isnt this the same as he had last time. He has known histo in his bone marow this will take time to improve

## 2012-06-20 NOTE — Telephone Encounter (Signed)
OK.  Thank you.  David Knapp

## 2012-06-20 NOTE — Telephone Encounter (Signed)
Alert low WBC result = 1.4.  Routed to Dr. Daiva Eves.

## 2012-06-25 LAB — AFB CULTURE, BLOOD

## 2012-06-28 LAB — AFB CULTURE, BLOOD

## 2012-06-29 LAB — AFB CULTURE, BLOOD

## 2012-07-01 ENCOUNTER — Encounter: Payer: Self-pay | Admitting: Infectious Diseases

## 2012-07-03 LAB — AFB CULTURE WITH SMEAR (NOT AT ARMC)

## 2012-07-11 ENCOUNTER — Ambulatory Visit: Payer: Self-pay | Admitting: Infectious Disease

## 2012-07-25 ENCOUNTER — Ambulatory Visit: Payer: Self-pay | Admitting: Infectious Disease

## 2012-10-18 ENCOUNTER — Encounter: Payer: Self-pay | Admitting: *Deleted

## 2012-11-27 ENCOUNTER — Other Ambulatory Visit: Payer: Self-pay | Admitting: *Deleted

## 2012-11-27 DIAGNOSIS — B20 Human immunodeficiency virus [HIV] disease: Secondary | ICD-10-CM

## 2012-11-27 MED ORDER — EMTRICITABINE-TENOFOVIR DF 200-300 MG PO TABS: 1.0000 | ORAL_TABLET | Freq: Every day | ORAL | Status: DC

## 2012-11-27 MED ORDER — DARUNAVIR ETHANOLATE 800 MG PO TABS: 800.0000 mg | ORAL_TABLET | Freq: Every day | ORAL | Status: DC

## 2012-11-27 MED ORDER — RITONAVIR 100 MG PO TABS: 100.0000 mg | ORAL_TABLET | Freq: Every day | ORAL | Status: DC

## 2012-12-09 ENCOUNTER — Encounter: Payer: Self-pay | Admitting: Infectious Disease

## 2012-12-09 ENCOUNTER — Ambulatory Visit (INDEPENDENT_AMBULATORY_CARE_PROVIDER_SITE_OTHER): Payer: Self-pay | Admitting: Infectious Disease

## 2012-12-09 VITALS — BP 110/68 | HR 66 | Temp 99.0°F | Wt 184.0 lb

## 2012-12-09 DIAGNOSIS — D61818 Other pancytopenia: Secondary | ICD-10-CM

## 2012-12-09 DIAGNOSIS — B394 Histoplasmosis capsulati, unspecified: Secondary | ICD-10-CM

## 2012-12-09 DIAGNOSIS — Z9119 Patient's noncompliance with other medical treatment and regimen: Secondary | ICD-10-CM

## 2012-12-09 DIAGNOSIS — B20 Human immunodeficiency virus [HIV] disease: Secondary | ICD-10-CM

## 2012-12-09 DIAGNOSIS — Z113 Encounter for screening for infections with a predominantly sexual mode of transmission: Secondary | ICD-10-CM

## 2012-12-09 LAB — CBC WITH DIFFERENTIAL/PLATELET
Basophils Relative: 0 % (ref 0–1)
Eosinophils Absolute: 0.1 10*3/uL (ref 0.0–0.7)
Hemoglobin: 15.3 g/dL (ref 13.0–17.0)
Lymphs Abs: 1.5 10*3/uL (ref 0.7–4.0)
Monocytes Relative: 7 % (ref 3–12)
Neutro Abs: 3.6 10*3/uL (ref 1.7–7.7)
Neutrophils Relative %: 65 % (ref 43–77)
Platelets: 184 10*3/uL (ref 150–400)
RBC: 4.95 MIL/uL (ref 4.22–5.81)

## 2012-12-09 LAB — PHOSPHORUS: Phosphorus: 3.3 mg/dL (ref 2.3–4.6)

## 2012-12-09 LAB — COMPLETE METABOLIC PANEL WITH GFR
Alkaline Phosphatase: 93 U/L (ref 39–117)
BUN: 16 mg/dL (ref 6–23)
CO2: 26 mEq/L (ref 19–32)
Creat: 1.15 mg/dL (ref 0.50–1.35)
GFR, Est African American: >89 mL/min
GFR, Est Non African American: 83 mL/min
Glucose, Bld: 75 mg/dL (ref 70–99)
Total Bilirubin: 0.5 mg/dL (ref 0.3–1.2)

## 2012-12-09 NOTE — Patient Instructions (Signed)
Pleae make appt on Thursday the 18th at 3 pm

## 2012-12-09 NOTE — Progress Notes (Signed)
[facility]  HPI   32 year old [place] gentleman with PMHX significant for HIV/AIDS and disseminated histoplasmosis now with recurrent disseminated histoplasmosis with CNS involvement sp 2 weeks ampho (see prior notes) and itraconazole  He was also started on Prezista, NOrvir and Truvada with BActrim and Azithromycin. He failed to make any FU visits since the summer. Per Feliz Beam, our pharmacist review of his pickups at [facility] he had been picking up until October. The pt claims that he had neglected to fill ADAP papers and then done so. He claimed to have only missed 8 days of meds. He did not bring meds with him today   Review of Systems  Constitutional: Negative for fever, chills, diaphoresis, appetite change, fatigue and unexpected weight change.  HENT: Negative for congestion, rhinorrhea, sinus pressure, sneezing, sore throat and trouble swallowing.   Eyes: Negative for photophobia and visual disturbance.  Respiratory: Negative for cough, chest tightness, shortness of breath, wheezing and stridor.   Cardiovascular: Negative for chest pain, palpitations and leg swelling.  Gastrointestinal: Negative for nausea, vomiting, abdominal pain, diarrhea, constipation, blood in stool, abdominal distention and anal bleeding.  Genitourinary: Negative for dysuria, hematuria, flank pain and difficulty urinating.  Musculoskeletal: Negative for arthralgias, back pain, gait problem, joint swelling and myalgias.  Skin: Positive for color change and rash. Negative for pallor and wound.  Neurological: Negative for dizziness, tremors, weakness and light-headedness.  Hematological: Negative for adenopathy. Does not bruise/bleed easily.  Psychiatric/Behavioral: Negative for behavioral problems, confusion, sleep disturbance, dysphoric mood, decreased concentration and agitation.     Physical Exam  Constitutional: He is oriented to person, place, and time. He appears  well-nourished. No distress.  HENT:  Head: Normocephalic and atraumatic.  Mouth/Throat: Oropharynx is clear and moist. No oropharyngeal exudate.  Eyes: Conjunctivae and EOM are normal. No scleral icterus.  Neck: Normal range of motion. Neck supple.  Cardiovascular: Normal rate, regular rhythm and normal heart sounds.  Exam reveals no gallop and no friction rub.   No murmur heard. Pulmonary/Chest: Effort normal and breath sounds normal. No respiratory distress. He has no wheezes. He has no rales. He exhibits no tenderness.  Abdominal: He exhibits no distension and no mass. There is no tenderness. There is no rebound and no guarding.  Musculoskeletal: He exhibits no edema and no tenderness.  Lymphadenopathy:    He has cervical adenopathy.  Neurological: He is alert and oriented to person, place, and time. He exhibits normal muscle tone. Coordination normal.  Skin: Skin is warm and dry. Rash noted. He is not diaphoretic. No erythema. No pallor.     Psychiatric: He has a normal mood and affect. His behavior is normal. Judgment and thought content normal.      Assessment/Plan:   #1 HIV/AIDS: Continue t Prezista Norvir and Truvada on fourth day of his itraconazole therapy, he does have a 103 as well as a 184V mutation I would prefer him to be on a protease inhibitor-based regimen. We'll also continue Bactrim for PCP prophylaxis and azithromycin for Mycobacterium avium prophylaxis. I will recheck of his viral load today and in a month  I spent greater than 40 minutes with the patient including greater than 50% of time in face to face counsel of the patient and in coordination of their care.   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 Certainly some of the lesions hand would also look consistent with molluscum  but I think this is all disseminated histoplasmosis. Will consider biopsy in future if still not resolved  #4 Pancytopenia: Due to disseminated histoplasmosis recheck labs

## 2012-12-09 NOTE — Progress Notes (Signed)
Patient ID: David Knapp, male   DOB: Nov 12, 1980, 32 y.o.   MRN: [id number]   Pictures of hand lesions and facial lesions:

## 2012-12-10 LAB — RPR

## 2012-12-11 LAB — T-HELPER CELL (CD4) - (RCID CLINIC ONLY): CD4 T Cell Abs: 80 /uL — ABNORMAL LOW (ref 400–2700)

## 2012-12-12 LAB — HLA B*5701: HLA-B*5701 w/rflx HLA-B High: NEGATIVE

## 2012-12-14 LAB — ITRACONAZOLE LEVEL, HPLC: Results Received: 0.36 ug/mL — ABNORMAL HIGH (ref <0.05)

## 2012-12-19 ENCOUNTER — Ambulatory Visit: Payer: Self-pay | Admitting: Infectious Disease

## 2013-01-20 ENCOUNTER — Telehealth: Payer: Self-pay | Admitting: Infectious Disease

## 2013-01-20 NOTE — Telephone Encounter (Signed)
This guy was almost <400 at last visit.  He has bad disseminated histo  Need him to come in and renew ADAP for Spring and rechek his VL and CD4  He is [place] speaking only

## 2013-01-21 ENCOUNTER — Telehealth: Payer: Self-pay | Admitting: *Deleted

## 2013-01-21 NOTE — Telephone Encounter (Signed)
Sent the patient a letter and an appt as I was unable to communicate with him (Spanish speaking only). Hopefully this will prompt him to call the office to see why she was sent something or someone in his home will call for him. His appt is set for February

## 2013-02-18 ENCOUNTER — Ambulatory Visit: Payer: Self-pay | Admitting: Infectious Disease

## 2013-04-17 ENCOUNTER — Ambulatory Visit: Payer: Self-pay

## 2013-04-17 ENCOUNTER — Other Ambulatory Visit: Payer: Self-pay | Admitting: Licensed Clinical Social Worker

## 2013-04-17 ENCOUNTER — Other Ambulatory Visit: Payer: Self-pay

## 2013-04-17 ENCOUNTER — Encounter: Payer: Self-pay | Admitting: *Deleted

## 2013-04-17 DIAGNOSIS — B20 Human immunodeficiency virus [HIV] disease: Secondary | ICD-10-CM

## 2013-04-17 LAB — CBC WITH DIFFERENTIAL/PLATELET
Basophils Absolute: 0.1 10*3/uL (ref 0.0–0.1)
Basophils Relative: 1 % (ref 0–1)
EOS PCT: 4 % (ref 0–5)
Eosinophils Absolute: 0.2 10*3/uL (ref 0.0–0.7)
HEMATOCRIT: 44 % (ref 39.0–52.0)
HEMOGLOBIN: 15.6 g/dL (ref 13.0–17.0)
LYMPHS ABS: 0.9 10*3/uL (ref 0.7–4.0)
LYMPHS PCT: 18 % (ref 12–46)
MCH: 31.1 pg (ref 26.0–34.0)
MCHC: 35.5 g/dL (ref 30.0–36.0)
MCV: 87.6 fL (ref 78.0–100.0)
MONO ABS: 0.4 10*3/uL (ref 0.1–1.0)
Monocytes Relative: 7 % (ref 3–12)
Neutro Abs: 3.6 10*3/uL (ref 1.7–7.7)
Neutrophils Relative %: 70 % (ref 43–77)
Platelets: 192 10*3/uL (ref 150–400)
RBC: 5.02 MIL/uL (ref 4.22–5.81)
RDW: 16.1 % — ABNORMAL HIGH (ref 11.5–15.5)
WBC: 5.1 10*3/uL (ref 4.0–10.5)

## 2013-04-17 LAB — COMPLETE METABOLIC PANEL WITH GFR
ALBUMIN: 4.4 g/dL (ref 3.5–5.2)
ALK PHOS: 75 U/L (ref 39–117)
ALT: 17 U/L (ref 0–53)
AST: 21 U/L (ref 0–37)
BUN: 14 mg/dL (ref 6–23)
CO2: 22 mEq/L (ref 19–32)
CREATININE: 0.87 mg/dL (ref 0.50–1.35)
Calcium: 9 mg/dL (ref 8.4–10.5)
Chloride: 107 mEq/L (ref 96–112)
GFR, Est African American: >89 mL/min
GFR, Est Non African American: >89 mL/min
Glucose, Bld: 98 mg/dL (ref 70–99)
POTASSIUM: 4.6 meq/L (ref 3.5–5.3)
Sodium: 137 mEq/L (ref 135–145)
Total Bilirubin: 0.6 mg/dL (ref 0.2–1.2)
Total Protein: 8 g/dL (ref 6.0–8.3)

## 2013-04-17 MED ORDER — RITONAVIR 100 MG PO TABS: 100.0000 mg | ORAL_TABLET | Freq: Every day | ORAL | Status: DC

## 2013-04-17 MED ORDER — DARUNAVIR ETHANOLATE 800 MG PO TABS: 800.0000 mg | ORAL_TABLET | Freq: Every day | ORAL | Status: DC

## 2013-04-17 MED ORDER — EMTRICITABINE-TENOFOVIR DF 200-300 MG PO TABS: 1.0000 | ORAL_TABLET | Freq: Every day | ORAL | Status: DC

## 2013-04-18 LAB — T-HELPER CELL (CD4) - (RCID CLINIC ONLY)
CD4 % Helper T Cell: 11 % — ABNORMAL LOW (ref 33–55)
CD4 T Cell Abs: 120 /uL — ABNORMAL LOW (ref 400–2700)

## 2013-04-18 LAB — HIV-1 RNA QUANT-NO REFLEX-BLD
HIV 1 RNA QUANT: 28 {copies}/mL — AB (ref <20)
HIV-1 RNA Quant, Log: 1.45 {Log} — ABNORMAL HIGH (ref <1.30)

## 2013-05-14 ENCOUNTER — Ambulatory Visit (INDEPENDENT_AMBULATORY_CARE_PROVIDER_SITE_OTHER): Payer: Self-pay | Admitting: Infectious Disease

## 2013-05-14 ENCOUNTER — Encounter: Payer: Self-pay | Admitting: Infectious Disease

## 2013-05-14 VITALS — Ht 66.5 in | Wt 197.0 lb

## 2013-05-14 DIAGNOSIS — Z23 Encounter for immunization: Secondary | ICD-10-CM

## 2013-05-14 DIAGNOSIS — B399 Histoplasmosis, unspecified: Secondary | ICD-10-CM

## 2013-05-14 DIAGNOSIS — R21 Rash and other nonspecific skin eruption: Secondary | ICD-10-CM

## 2013-05-14 DIAGNOSIS — B20 Human immunodeficiency virus [HIV] disease: Secondary | ICD-10-CM

## 2013-05-14 DIAGNOSIS — B394 Histoplasmosis capsulati, unspecified: Secondary | ICD-10-CM

## 2013-05-14 DIAGNOSIS — D61818 Other pancytopenia: Secondary | ICD-10-CM

## 2013-05-14 MED ORDER — ITRACONAZOLE 100 MG PO CAPS: 200.0000 mg | ORAL_CAPSULE | Freq: Every day | ORAL | Status: DC

## 2013-05-14 MED ORDER — SULFAMETHOXAZOLE-TMP DS 800-160 MG PO TABS: 1.0000 | ORAL_TABLET | Freq: Every day | ORAL | Status: DC

## 2013-05-14 MED ORDER — DARUNAVIR-COBICISTAT 800-150 MG PO TABS: 1.0000 | ORAL_TABLET | Freq: Every day | ORAL | Status: DC

## 2013-05-14 MED ORDER — EMTRICITABINE-TENOFOVIR DF 200-300 MG PO TABS: 1.0000 | ORAL_TABLET | Freq: Every day | ORAL | Status: DC

## 2013-05-14 MED ORDER — AZITHROMYCIN 600 MG PO TABS: 1200.0000 mg | ORAL_TABLET | ORAL | Status: DC

## 2013-05-14 NOTE — Patient Instructions (Addendum)
TRAIGA TODAS SUS MEDICINAS LA PROXIMA VEZ

## 2013-05-14 NOTE — Progress Notes (Signed)
[facility]  HPI   33 year old [place] gentleman with PMHX significant for HIV/AIDS and disseminated histoplasmosis now with recurrent disseminated histoplasmosis with CNS involvement sp 2 weeks ampho (see prior notes) and now on  itraconazole  He was also started on Prezista, NOrvir and Truvada with BActrim and Azithromycin.   He has suppress his viral load down to 28 copies per milliliter of blood and a CD4 count has risen to 120.  While he did not bring his medications he could recognize all the medications which I showed him and where they extensively with a Engineer, structuralpanish translator and person.   Review of Systems  Constitutional: Negative for fever, chills, diaphoresis, appetite change, fatigue and unexpected weight change.  HENT: Negative for congestion, rhinorrhea, sinus pressure, sneezing, sore throat and trouble swallowing.   Eyes: Negative for photophobia and visual disturbance.  Respiratory: Negative for cough, chest tightness, shortness of breath, wheezing and stridor.   Cardiovascular: Negative for chest pain, palpitations and leg swelling.  Gastrointestinal: Negative for nausea, vomiting, abdominal pain, diarrhea, constipation, blood in stool, abdominal distention and anal bleeding.  Genitourinary: Negative for dysuria, hematuria, flank pain and difficulty urinating.  Musculoskeletal: Negative for arthralgias, back pain, gait problem, joint swelling and myalgias.  Skin: Positive for color change and rash. Negative for pallor and wound.  Neurological: Negative for dizziness, tremors, weakness and light-headedness.  Hematological: Negative for adenopathy. Does not bruise/bleed easily.  Psychiatric/Behavioral: Negative for behavioral problems, confusion, sleep disturbance, dysphoric mood, decreased concentration and agitation.     Physical Exam  Constitutional: He is oriented to person, place, and time. He appears well-nourished. No distress.  HENT:   Head: Normocephalic and atraumatic.  Mouth/Throat: Oropharynx is clear and moist. No oropharyngeal exudate.  Eyes: Conjunctivae and EOM are normal. No scleral icterus.  Neck: Normal range of motion. Neck supple.  Cardiovascular: Normal rate, regular rhythm and normal heart sounds.  Exam reveals no gallop and no friction rub.   No murmur heard. Pulmonary/Chest: Effort normal and breath sounds normal. No respiratory distress. He has no wheezes. He has no rales. He exhibits no tenderness.  Abdominal: He exhibits no distension and no mass. There is no tenderness. There is no rebound and no guarding.  Musculoskeletal: He exhibits no edema and no tenderness.  Lymphadenopathy:    He has cervical adenopathy.  Neurological: He is alert and oriented to person, place, and time. He exhibits normal muscle tone. Coordination normal.  Skin: Skin is warm and dry. Rash noted. He is not diaphoretic. No erythema. No pallor.     Psychiatric: He has a normal mood and affect. His behavior is normal. Judgment and thought content normal.    Skin: Still with pedunculated lesions on his fingers see below      Assessment/Plan:   #1 HIV/AIDS: Consolidate to PREZCOBIX  and Truvada o  continue Bactrim for PCP prophylaxis and azithromycin for Mycobacterium avium prophylaxis.   Him come back in 2 months time  I spent greater than 25 minutes with the patient including greater than 50% of time in face to face counsel of the patient and in coordination of their care.   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 Certainly some of the lesions hand would also look consistent with molluscum but I think this is all disseminated histoplasmosis. Will consider biopsy in future  #4 Pancytopenia: Due to disseminated  histoplasmosis resolved now

## 2013-05-15 NOTE — Addendum Note (Signed)
Addended by: Andree CossHOWELL, MICHELLE M on: 05/15/2013 04:55 PM   Modules accepted: Orders

## 2013-07-14 ENCOUNTER — Ambulatory Visit: Payer: Self-pay | Admitting: Infectious Disease

## 2013-07-22 ENCOUNTER — Ambulatory Visit (INDEPENDENT_AMBULATORY_CARE_PROVIDER_SITE_OTHER): Payer: Self-pay | Admitting: Infectious Disease

## 2013-07-22 ENCOUNTER — Encounter: Payer: Self-pay | Admitting: Infectious Disease

## 2013-07-22 VITALS — BP 113/72 | HR 58 | Temp 98.8°F | Wt 208.0 lb

## 2013-07-22 DIAGNOSIS — B079 Viral wart, unspecified: Secondary | ICD-10-CM

## 2013-07-22 DIAGNOSIS — Z113 Encounter for screening for infections with a predominantly sexual mode of transmission: Secondary | ICD-10-CM

## 2013-07-22 DIAGNOSIS — B399 Histoplasmosis, unspecified: Secondary | ICD-10-CM

## 2013-07-22 DIAGNOSIS — D61818 Other pancytopenia: Secondary | ICD-10-CM

## 2013-07-22 DIAGNOSIS — B20 Human immunodeficiency virus [HIV] disease: Secondary | ICD-10-CM

## 2013-07-22 LAB — COMPLETE METABOLIC PANEL WITH GFR
ALBUMIN: 4.4 g/dL (ref 3.5–5.2)
ALK PHOS: 76 U/L (ref 39–117)
ALT: 30 U/L (ref 0–53)
AST: 30 U/L (ref 0–37)
BUN: 13 mg/dL (ref 6–23)
CALCIUM: 9.2 mg/dL (ref 8.4–10.5)
CHLORIDE: 108 meq/L (ref 96–112)
CO2: 22 mEq/L (ref 19–32)
CREATININE: 0.82 mg/dL (ref 0.50–1.35)
GFR, Est African American: >89 mL/min
GFR, Est Non African American: >89 mL/min
Glucose, Bld: 112 mg/dL — ABNORMAL HIGH (ref 70–99)
POTASSIUM: 3.9 meq/L (ref 3.5–5.3)
Sodium: 138 mEq/L (ref 135–145)
Total Bilirubin: 0.8 mg/dL (ref 0.2–1.2)
Total Protein: 7.7 g/dL (ref 6.0–8.3)

## 2013-07-22 LAB — CBC WITH DIFFERENTIAL/PLATELET
Basophils Absolute: 0 10*3/uL (ref 0.0–0.1)
Basophils Relative: 0 % (ref 0–1)
EOS ABS: 0.2 10*3/uL (ref 0.0–0.7)
EOS PCT: 3 % (ref 0–5)
HEMATOCRIT: 44.3 % (ref 39.0–52.0)
Hemoglobin: 16 g/dL (ref 13.0–17.0)
LYMPHS ABS: 1.2 10*3/uL (ref 0.7–4.0)
Lymphocytes Relative: 22 % (ref 12–46)
MCH: 32 pg (ref 26.0–34.0)
MCHC: 36.1 g/dL — ABNORMAL HIGH (ref 30.0–36.0)
MCV: 88.6 fL (ref 78.0–100.0)
MONO ABS: 0.4 10*3/uL (ref 0.1–1.0)
Monocytes Relative: 7 % (ref 3–12)
Neutro Abs: 3.6 10*3/uL (ref 1.7–7.7)
Neutrophils Relative %: 68 % (ref 43–77)
PLATELETS: 180 10*3/uL (ref 150–400)
RBC: 5 MIL/uL (ref 4.22–5.81)
RDW: 14.9 % (ref 11.5–15.5)
WBC: 5.3 10*3/uL (ref 4.0–10.5)

## 2013-07-22 LAB — RPR

## 2013-07-22 MED ORDER — IMIQUIMOD 5 % EX CREA: TOPICAL_CREAM | CUTANEOUS | Status: DC

## 2013-07-22 NOTE — Patient Instructions (Signed)
We need to ensure ADAP renewal  Book on August the 12th with Zenaida Niecevan dam ok to [employer]

## 2013-07-22 NOTE — Progress Notes (Signed)
[facility]  HPI   33 year old [place] gentleman with PMHX significant for HIV/AIDS and disseminated histoplasmosis now with recurrent disseminated histoplasmosis with CNS involvement sp 2 weeks ampho (see prior notes) and now on  itraconazole  He was also started on Prezista, NOrvir and Truvada with BActrim and Azithromycin,    He has suppress his viral load down to 28 copies per milliliter of blood and a CD4 count has risen to 120.  At the last visit we changed him over to [facility] with Truvada with continuation of his itraconazole and his medicines for opportunistic infection prophylaxis. He brought all these medicines with him and went over them with his Engineer, structural.  The verruca is lesions on his hands have not improved while on itraconazole so I think we may be should treat him for possible warts here.  We're going to ensure also that he is renewed for his ADAP for the fall September.line.   Review of Systems  Constitutional: Negative for fever, chills, diaphoresis, appetite change, fatigue and unexpected weight change.  HENT: Negative for congestion, rhinorrhea, sinus pressure, sneezing, sore throat and trouble swallowing.   Eyes: Negative for photophobia and visual disturbance.  Respiratory: Negative for cough, chest tightness, shortness of breath, wheezing and stridor.   Cardiovascular: Negative for chest pain, palpitations and leg swelling.  Gastrointestinal: Negative for nausea, vomiting, abdominal pain, diarrhea, constipation, blood in stool, abdominal distention and anal bleeding.  Genitourinary: Negative for dysuria, hematuria, flank pain and difficulty urinating.  Musculoskeletal: Negative for arthralgias, back pain, gait problem, joint swelling and myalgias.  Skin: Positive for color change and rash. Negative for pallor and wound.  Neurological: Negative for dizziness, tremors, weakness and light-headedness.  Hematological: Negative  for adenopathy. Does not bruise/bleed easily.  Psychiatric/Behavioral: Negative for behavioral problems, confusion, sleep disturbance, dysphoric mood, decreased concentration and agitation.     Physical Exam  Constitutional: He is oriented to person, place, and time. He appears well-nourished. No distress.  HENT:  Head: Normocephalic and atraumatic.  Mouth/Throat: Oropharynx is clear and moist. No oropharyngeal exudate.  Eyes: Conjunctivae and EOM are normal. No scleral icterus.  Neck: Normal range of motion. Neck supple.  Cardiovascular: Normal rate, regular rhythm and normal heart sounds.  Exam reveals no gallop and no friction rub.   No murmur heard. Pulmonary/Chest: Effort normal and breath sounds normal. No respiratory distress. He has no wheezes. He has no rales. He exhibits no tenderness.  Abdominal: He exhibits no distension and no mass. There is no tenderness. There is no rebound and no guarding.  Musculoskeletal: He exhibits no edema and no tenderness.  Lymphadenopathy:    He has cervical adenopathy.  Neurological: He is alert and oriented to person, place, and time. He exhibits normal muscle tone. Coordination normal.  Skin: Skin is warm and dry. Rash noted. He is not diaphoretic. No erythema. No pallor.     Psychiatric: He has a normal mood and affect. His behavior is normal. Judgment and thought content normal.    Skin: Still with pedunculated lesions on his fingers see below      Assessment/Plan:   #1 HIV/AIDS: Continue PREZCOBIX  and Truvada. Check labs today and renew ADAP  continue Bactrim for PCP prophylaxis , and likely discontinue azithromycin  RTC in 2-3 weeks  I spent greater than 25 minutes with the patient including greater than 50% of time in face to face counsel of the patient and in coordination of their care.   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

## 2013-07-23 LAB — T-HELPER CELL (CD4) - (RCID CLINIC ONLY)
CD4 T CELL HELPER: 13 % — AB (ref 33–55)
CD4 T Cell Abs: 150 /uL — ABNORMAL LOW (ref 400–2700)

## 2013-07-26 LAB — HIV-1 RNA QUANT-NO REFLEX-BLD

## 2013-07-28 ENCOUNTER — Other Ambulatory Visit: Payer: Self-pay | Admitting: *Deleted

## 2013-07-28 DIAGNOSIS — B20 Human immunodeficiency virus [HIV] disease: Secondary | ICD-10-CM

## 2013-07-28 MED ORDER — DARUNAVIR-COBICISTAT 800-150 MG PO TABS: 1.0000 | ORAL_TABLET | Freq: Every day | ORAL | Status: DC

## 2013-07-28 MED ORDER — EMTRICITABINE-TENOFOVIR DF 200-300 MG PO TABS: 1.0000 | ORAL_TABLET | Freq: Every day | ORAL | Status: DC

## 2013-07-28 NOTE — Telephone Encounter (Signed)
ADAP Application 

## 2013-08-13 ENCOUNTER — Ambulatory Visit: Payer: Self-pay

## 2013-08-13 ENCOUNTER — Ambulatory Visit: Payer: Self-pay | Admitting: Infectious Disease

## 2013-10-29 ENCOUNTER — Other Ambulatory Visit: Payer: Self-pay | Admitting: Infectious Disease

## 2013-12-24 ENCOUNTER — Other Ambulatory Visit: Payer: Self-pay | Admitting: Infectious Disease

## 2013-12-24 ENCOUNTER — Other Ambulatory Visit: Payer: Self-pay | Admitting: Internal Medicine

## 2014-01-12 ENCOUNTER — Other Ambulatory Visit (INDEPENDENT_AMBULATORY_CARE_PROVIDER_SITE_OTHER): Payer: Self-pay

## 2014-01-12 DIAGNOSIS — B20 Human immunodeficiency virus [HIV] disease: Secondary | ICD-10-CM

## 2014-01-13 LAB — CBC WITH DIFFERENTIAL/PLATELET
BASOS PCT: 0 % (ref 0–1)
Basophils Absolute: 0 10*3/uL (ref 0.0–0.1)
EOS ABS: 0.2 10*3/uL (ref 0.0–0.7)
EOS PCT: 3 % (ref 0–5)
HEMATOCRIT: 43.1 % (ref 39.0–52.0)
HEMOGLOBIN: 15.6 g/dL (ref 13.0–17.0)
LYMPHS ABS: 1.4 10*3/uL (ref 0.7–4.0)
LYMPHS PCT: 25 % (ref 12–46)
MCH: 33.1 pg (ref 26.0–34.0)
MCHC: 36.2 g/dL — ABNORMAL HIGH (ref 30.0–36.0)
MCV: 91.3 fL (ref 78.0–100.0)
MPV: 9.1 fL (ref 8.6–12.4)
Monocytes Absolute: 0.3 10*3/uL (ref 0.1–1.0)
Monocytes Relative: 6 % (ref 3–12)
NEUTROS ABS: 3.6 10*3/uL (ref 1.7–7.7)
Neutrophils Relative %: 66 % (ref 43–77)
Platelets: 189 10*3/uL (ref 150–400)
RBC: 4.72 MIL/uL (ref 4.22–5.81)
RDW: 16.9 % — ABNORMAL HIGH (ref 11.5–15.5)
WBC: 5.5 10*3/uL (ref 4.0–10.5)

## 2014-01-13 LAB — T-HELPER CELL (CD4) - (RCID CLINIC ONLY)
CD4 % Helper T Cell: 15 % — ABNORMAL LOW (ref 33–55)
CD4 T Cell Abs: 210 /uL — ABNORMAL LOW (ref 400–2700)

## 2014-01-13 LAB — COMPLETE METABOLIC PANEL WITH GFR
ALT: 40 U/L (ref 0–53)
AST: 40 U/L — AB (ref 0–37)
Albumin: 4.7 g/dL (ref 3.5–5.2)
Alkaline Phosphatase: 77 U/L (ref 39–117)
BUN: 17 mg/dL (ref 6–23)
CALCIUM: 9.6 mg/dL (ref 8.4–10.5)
CHLORIDE: 101 meq/L (ref 96–112)
CO2: 26 meq/L (ref 19–32)
Creat: 1.34 mg/dL (ref 0.50–1.35)
GFR, EST AFRICAN AMERICAN: 79 mL/min
GFR, Est Non African American: 69 mL/min
Glucose, Bld: 85 mg/dL (ref 70–99)
Potassium: 4 mEq/L (ref 3.5–5.3)
SODIUM: 137 meq/L (ref 135–145)
TOTAL PROTEIN: 7.7 g/dL (ref 6.0–8.3)
Total Bilirubin: 0.8 mg/dL (ref 0.2–1.2)

## 2014-01-13 LAB — HIV-1 RNA ULTRAQUANT REFLEX TO GENTYP+: HIV 1 RNA Quant: <20 copies/mL (ref <20)

## 2014-01-28 ENCOUNTER — Encounter: Payer: Self-pay | Admitting: Infectious Disease

## 2014-01-28 ENCOUNTER — Ambulatory Visit (INDEPENDENT_AMBULATORY_CARE_PROVIDER_SITE_OTHER): Payer: Self-pay | Admitting: Infectious Disease

## 2014-01-28 ENCOUNTER — Ambulatory Visit: Payer: Self-pay

## 2014-01-28 VITALS — BP 125/85 | HR 65 | Temp 98.1°F | Wt 230.0 lb

## 2014-01-28 DIAGNOSIS — B079 Viral wart, unspecified: Secondary | ICD-10-CM

## 2014-01-28 DIAGNOSIS — B20 Human immunodeficiency virus [HIV] disease: Secondary | ICD-10-CM

## 2014-01-28 DIAGNOSIS — D61818 Other pancytopenia: Secondary | ICD-10-CM

## 2014-01-28 DIAGNOSIS — B399 Histoplasmosis, unspecified: Secondary | ICD-10-CM

## 2014-01-28 DIAGNOSIS — Z23 Encounter for immunization: Secondary | ICD-10-CM

## 2014-01-28 MED ORDER — DARUNAVIR-COBICISTAT 800-150 MG PO TABS: 1.0000 | ORAL_TABLET | Freq: Every day | ORAL | Status: DC

## 2014-01-28 MED ORDER — ITRACONAZOLE 100 MG PO CAPS: 200.0000 mg | ORAL_CAPSULE | Freq: Every day | ORAL | Status: DC

## 2014-01-28 MED ORDER — EMTRICITABINE-TENOFOVIR DF 200-300 MG PO TABS: 1.0000 | ORAL_TABLET | Freq: Every day | ORAL | Status: DC

## 2014-01-28 MED ORDER — IMIQUIMOD 5 % EX CREA: TOPICAL_CREAM | CUTANEOUS | Status: DC

## 2014-01-28 NOTE — Progress Notes (Signed)
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   The verruca is lesions on his hands have not improved while on itraconazole nor with Aldara. He renewed ADAP thru March but per his report [facility] on [address] would not renew his Bactrim.  His labs from mid January are encouraging.  Lab Results  Component Value Date   HIV1RNAQUANT <20 01/12/2014   Lab Results  Component Value Date   CD4TABS 210* 01/12/2014   CD4TABS 150* 07/22/2013   CD4TABS 120* 04/17/2013    He brought paperwork to renew ADAP again.  He had  Been taking Prezcobix with Norvir while Truvada was not filled.   Review of Systems  Constitutional: Negative for fever, chills, diaphoresis, appetite change, fatigue and unexpected weight change.  HENT: Negative for congestion, rhinorrhea, sinus pressure, sneezing, sore throat and trouble swallowing.   Eyes: Negative for photophobia and visual disturbance.  Respiratory: Negative for cough, chest tightness, shortness of breath, wheezing and stridor.   Cardiovascular: Negative for chest pain, palpitations and leg swelling.  Gastrointestinal: Negative for nausea, vomiting, abdominal pain, diarrhea, constipation, blood in stool, abdominal distention and anal bleeding.  Genitourinary: Negative for dysuria, hematuria, flank pain and difficulty urinating.  Musculoskeletal: Negative for myalgias, back pain, joint swelling, arthralgias and gait problem.  Skin: Positive for color change and rash. Negative for  pallor and wound.  Neurological: Negative for dizziness, tremors, weakness and light-headedness.  Hematological: Negative for adenopathy. Does not bruise/bleed easily.  Psychiatric/Behavioral: Negative for behavioral problems, confusion, sleep disturbance, dysphoric mood, decreased concentration and agitation.     Physical Exam  Constitutional: He is oriented to person, place, and time. He appears well-nourished. No distress.  HENT:  Head: Normocephalic and atraumatic.  Mouth/Throat: Oropharynx is clear and moist. No oropharyngeal exudate.  Eyes: Conjunctivae and EOM are normal. No scleral icterus.  Neck: Normal range of motion. Neck supple.  Cardiovascular: Normal rate, regular rhythm and normal heart sounds.  Exam reveals no gallop and no friction rub.   No murmur heard. Pulmonary/Chest: Effort normal and breath sounds normal. No respiratory distress. He has no wheezes. He has no rales. He exhibits no tenderness.  Abdominal: He exhibits no distension and no mass. There is no tenderness. There is no rebound and no guarding.  Musculoskeletal: He exhibits no edema or tenderness.  Lymphadenopathy:    He has cervical adenopathy.  Neurological: He is alert and oriented to person, place, and time. He exhibits normal muscle tone. Coordination normal.  Skin: Skin is warm and dry. Rash noted. He is not diaphoretic. No erythema. No pallor.     Psychiatric: He has a normal mood and affect. His behavior is normal. Judgment and thought content normal.    Skin: Still with pedunculated lesions on his fingers see below      Assessment/Plan:   #1 HIV/AIDS: Continue PREZCOBIX  and Truvada.  renew ADAP  continue Bactrim for PCP prophylaxis , discontinue azithromycin. Make sure he gets TRUVADA and PREZCOBIX  RTC in 2 months for labs and 10 weeks to see me  I spent greater than 25 minutes with the patient including greater than 50% of time in face to face counsel of the patient and in  coordination of their care.   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

## 2014-01-30 ENCOUNTER — Other Ambulatory Visit: Payer: Self-pay | Admitting: *Deleted

## 2014-01-30 DIAGNOSIS — B20 Human immunodeficiency virus [HIV] disease: Secondary | ICD-10-CM

## 2014-01-30 MED ORDER — EMTRICITABINE-TENOFOVIR DF 200-300 MG PO TABS: 1.0000 | ORAL_TABLET | Freq: Every day | ORAL | Status: DC

## 2014-01-30 MED ORDER — DARUNAVIR-COBICISTAT 800-150 MG PO TABS: 1.0000 | ORAL_TABLET | Freq: Every day | ORAL | Status: DC

## 2014-03-31 ENCOUNTER — Other Ambulatory Visit (INDEPENDENT_AMBULATORY_CARE_PROVIDER_SITE_OTHER): Payer: Self-pay

## 2014-03-31 DIAGNOSIS — B20 Human immunodeficiency virus [HIV] disease: Secondary | ICD-10-CM

## 2014-03-31 DIAGNOSIS — Z113 Encounter for screening for infections with a predominantly sexual mode of transmission: Secondary | ICD-10-CM

## 2014-03-31 LAB — COMPLETE METABOLIC PANEL WITH GFR
ALT: 30 U/L (ref 0–53)
AST: 50 U/L — AB (ref 0–37)
Albumin: 4.6 g/dL (ref 3.5–5.2)
Alkaline Phosphatase: 60 U/L (ref 39–117)
BILIRUBIN TOTAL: 0.8 mg/dL (ref 0.2–1.2)
BUN: 20 mg/dL (ref 6–23)
CO2: 26 mEq/L (ref 19–32)
Calcium: 9 mg/dL (ref 8.4–10.5)
Chloride: 103 mEq/L (ref 96–112)
Creat: 1.32 mg/dL (ref 0.50–1.35)
GFR, EST AFRICAN AMERICAN: 80 mL/min
GFR, EST NON AFRICAN AMERICAN: 69 mL/min
GLUCOSE: 102 mg/dL — AB (ref 70–99)
POTASSIUM: 3.6 meq/L (ref 3.5–5.3)
Sodium: 138 mEq/L (ref 135–145)
Total Protein: 7.9 g/dL (ref 6.0–8.3)

## 2014-03-31 LAB — CBC WITH DIFFERENTIAL/PLATELET
BASOS ABS: 0 10*3/uL (ref 0.0–0.1)
Basophils Relative: 1 % (ref 0–1)
EOS PCT: 3 % (ref 0–5)
Eosinophils Absolute: 0.1 10*3/uL (ref 0.0–0.7)
HCT: 41.5 % (ref 39.0–52.0)
Hemoglobin: 14.3 g/dL (ref 13.0–17.0)
LYMPHS ABS: 1 10*3/uL (ref 0.7–4.0)
Lymphocytes Relative: 24 % (ref 12–46)
MCH: 33.1 pg (ref 26.0–34.0)
MCHC: 34.5 g/dL (ref 30.0–36.0)
MCV: 96.1 fL (ref 78.0–100.0)
MPV: 9.5 fL (ref 8.6–12.4)
Monocytes Absolute: 0.2 10*3/uL (ref 0.1–1.0)
Monocytes Relative: 6 % (ref 3–12)
Neutro Abs: 2.6 10*3/uL (ref 1.7–7.7)
Neutrophils Relative %: 66 % (ref 43–77)
PLATELETS: 163 10*3/uL (ref 150–400)
RBC: 4.32 MIL/uL (ref 4.22–5.81)
RDW: 15.4 % (ref 11.5–15.5)
WBC: 4 10*3/uL (ref 4.0–10.5)

## 2014-04-01 LAB — URINE CYTOLOGY ANCILLARY ONLY
CHLAMYDIA, DNA PROBE: NEGATIVE
Neisseria Gonorrhea: NEGATIVE

## 2014-04-01 LAB — T-HELPER CELL (CD4) - (RCID CLINIC ONLY)
CD4 T CELL HELPER: 19 % — AB (ref 33–55)
CD4 T Cell Abs: 190 /uL — ABNORMAL LOW (ref 400–2700)

## 2014-04-01 LAB — RPR

## 2014-04-02 LAB — HIV-1 RNA QUANT-NO REFLEX-BLD
HIV 1 RNA Quant: <20 copies/mL (ref <20)
HIV-1 RNA Quant, Log: <1.3 {Log} (ref <1.30)

## 2014-04-15 ENCOUNTER — Ambulatory Visit: Payer: Self-pay | Admitting: Infectious Disease

## 2014-07-01 ENCOUNTER — Other Ambulatory Visit (INDEPENDENT_AMBULATORY_CARE_PROVIDER_SITE_OTHER): Payer: Self-pay

## 2014-07-01 DIAGNOSIS — B20 Human immunodeficiency virus [HIV] disease: Secondary | ICD-10-CM

## 2014-07-01 DIAGNOSIS — Z79899 Other long term (current) drug therapy: Secondary | ICD-10-CM

## 2014-07-01 LAB — CBC WITH DIFFERENTIAL/PLATELET
BASOS PCT: 1 % (ref 0–1)
Basophils Absolute: 0.1 10*3/uL (ref 0.0–0.1)
EOS ABS: 0.2 10*3/uL (ref 0.0–0.7)
Eosinophils Relative: 4 % (ref 0–5)
HCT: 42.6 % (ref 39.0–52.0)
HEMOGLOBIN: 14.8 g/dL (ref 13.0–17.0)
Lymphocytes Relative: 26 % (ref 12–46)
Lymphs Abs: 1.3 10*3/uL (ref 0.7–4.0)
MCH: 33 pg (ref 26.0–34.0)
MCHC: 34.7 g/dL (ref 30.0–36.0)
MCV: 95.1 fL (ref 78.0–100.0)
MPV: 9.5 fL (ref 8.6–12.4)
Monocytes Absolute: 0.3 10*3/uL (ref 0.1–1.0)
Monocytes Relative: 5 % (ref 3–12)
NEUTROS PCT: 64 % (ref 43–77)
Neutro Abs: 3.2 10*3/uL (ref 1.7–7.7)
PLATELETS: 171 10*3/uL (ref 150–400)
RBC: 4.48 MIL/uL (ref 4.22–5.81)
RDW: 15.1 % (ref 11.5–15.5)
WBC: 5 10*3/uL (ref 4.0–10.5)

## 2014-07-02 LAB — COMPREHENSIVE METABOLIC PANEL
ALK PHOS: 61 U/L (ref 39–117)
ALT: 25 U/L (ref 0–53)
AST: 49 U/L — AB (ref 0–37)
Albumin: 4.8 g/dL (ref 3.5–5.2)
BILIRUBIN TOTAL: 0.8 mg/dL (ref 0.2–1.2)
BUN: 21 mg/dL (ref 6–23)
CALCIUM: 9.5 mg/dL (ref 8.4–10.5)
CO2: 25 mEq/L (ref 19–32)
Chloride: 103 mEq/L (ref 96–112)
Creat: 1.54 mg/dL — ABNORMAL HIGH (ref 0.50–1.35)
Glucose, Bld: 97 mg/dL (ref 70–99)
Potassium: 4 mEq/L (ref 3.5–5.3)
Sodium: 141 mEq/L (ref 135–145)
Total Protein: 8.2 g/dL (ref 6.0–8.3)

## 2014-07-02 LAB — LIPID PANEL
Cholesterol: 246 mg/dL — ABNORMAL HIGH (ref 0–200)
HDL: 49 mg/dL (ref >=40)
LDL Cholesterol: 146 mg/dL — ABNORMAL HIGH (ref 0–99)
Total CHOL/HDL Ratio: 5 Ratio
Triglycerides: 254 mg/dL — ABNORMAL HIGH (ref <150)
VLDL: 51 mg/dL — ABNORMAL HIGH (ref 0–40)

## 2014-07-02 LAB — T-HELPER CELL (CD4) - (RCID CLINIC ONLY)
CD4 % Helper T Cell: 16 % — ABNORMAL LOW (ref 33–55)
CD4 T Cell Abs: 210 /uL — ABNORMAL LOW (ref 400–2700)

## 2014-07-03 LAB — HIV-1 RNA QUANT-NO REFLEX-BLD
HIV 1 RNA Quant: <20 copies/mL (ref <20)
HIV-1 RNA Quant, Log: <1.3 {Log} (ref <1.30)

## 2014-07-15 ENCOUNTER — Encounter: Payer: Self-pay | Admitting: Infectious Disease

## 2014-07-15 ENCOUNTER — Ambulatory Visit (INDEPENDENT_AMBULATORY_CARE_PROVIDER_SITE_OTHER): Payer: Self-pay | Admitting: Infectious Disease

## 2014-07-15 VITALS — BP 117/78 | HR 62 | Temp 98.2°F | Wt 232.0 lb

## 2014-07-15 DIAGNOSIS — B079 Viral wart, unspecified: Secondary | ICD-10-CM

## 2014-07-15 DIAGNOSIS — B20 Human immunodeficiency virus [HIV] disease: Secondary | ICD-10-CM

## 2014-07-15 DIAGNOSIS — N179 Acute kidney failure, unspecified: Secondary | ICD-10-CM

## 2014-07-15 DIAGNOSIS — B399 Histoplasmosis, unspecified: Secondary | ICD-10-CM

## 2014-07-15 LAB — BASIC METABOLIC PANEL
BUN: 19 mg/dL (ref 6–23)
CHLORIDE: 103 meq/L (ref 96–112)
CO2: 25 mEq/L (ref 19–32)
Calcium: 9.6 mg/dL (ref 8.4–10.5)
Creat: 1.46 mg/dL — ABNORMAL HIGH (ref 0.50–1.35)
GLUCOSE: 90 mg/dL (ref 70–99)
Potassium: 3.7 mEq/L (ref 3.5–5.3)
SODIUM: 138 meq/L (ref 135–145)

## 2014-07-15 LAB — PHOSPHORUS: PHOSPHORUS: 3.7 mg/dL (ref 2.3–4.6)

## 2014-07-15 MED ORDER — EMTRICITABINE-TENOFOVIR AF 200-25 MG PO TABS: 1.0000 | ORAL_TABLET | Freq: Every day | ORAL | Status: DC

## 2014-07-15 NOTE — Progress Notes (Signed)
[facility]  HPI   34 year old [place] gentleman with PMHX significant for HIV/AIDS and disseminated histoplasmosis now with recurrent disseminated histoplasmosis with CNS involvement sp 2 weeks ampho (see prior notes) and sp itraconazole now for nearly 2 years. (he ran out of it 2 weeks ago when it was not refilled along with Bactrim)  He is currently on Prezcobix and Truvada alone. He claims to be taking Aldara as well.  .  Lab Results  Component Value Date   HIV1RNAQUANT <20 07/01/2014   Lab Results  Component Value Date   CD4TABS 210* 07/01/2014   CD4TABS 190* 03/31/2014   CD4TABS 210* 01/12/2014     Review of Systems  Constitutional: Negative for fever, chills, diaphoresis, appetite change, fatigue and unexpected weight change.  HENT: Negative for congestion, rhinorrhea, sinus pressure, sneezing, sore throat and trouble swallowing.   Eyes: Negative for photophobia and visual disturbance.  Respiratory: Negative for cough, chest tightness, shortness of breath, wheezing and stridor.   Cardiovascular: Negative for chest pain, palpitations and leg swelling.  Gastrointestinal: Negative for nausea, vomiting, abdominal pain, diarrhea, constipation, blood in stool, abdominal distention and anal bleeding.  Genitourinary: Negative for dysuria, hematuria, flank pain and difficulty urinating.  Musculoskeletal: Negative for myalgias, back pain, joint swelling, arthralgias and gait problem.  Skin: Positive for color change and rash. Negative for pallor and wound.  Neurological: Negative for dizziness, tremors, weakness and light-headedness.  Hematological: Negative for adenopathy. Does not bruise/bleed easily.  Psychiatric/Behavioral: Negative for behavioral problems, confusion, sleep disturbance, dysphoric mood, decreased concentration and agitation.     Physical Exam  Constitutional: He is oriented to person, place, and time. He appears well-nourished.  No distress.  HENT:  Head: Normocephalic and atraumatic.  Mouth/Throat: Oropharynx is clear and moist. No oropharyngeal exudate.  Eyes: Conjunctivae and EOM are normal. No scleral icterus.  Neck: Normal range of motion. Neck supple.  Cardiovascular: Normal rate, regular rhythm and normal heart sounds.  Exam reveals no gallop and no friction rub.   No murmur heard. Pulmonary/Chest: Effort normal and breath sounds normal. No respiratory distress. He has no wheezes. He has no rales. He exhibits no tenderness.  Abdominal: He exhibits no distension and no mass. There is no tenderness. There is no rebound and no guarding.  Musculoskeletal: He exhibits no edema or tenderness.  Lymphadenopathy:    He has cervical adenopathy.  Neurological: He is alert and oriented to person, place, and time. He exhibits normal muscle tone. Coordination normal.  Skin: Skin is warm and dry. Rash noted. He is not diaphoretic. No erythema. No pallor.     Psychiatric: He has a normal mood and affect. His behavior is normal. Judgment and thought content normal.    Skin: Still with pedunculated lesions on his fingers see below last visit and then today    07/15/14:        Assessment/Plan:   #1 HIV/AIDS: Continue PREZCOBIX  And change to Descovy.  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 Will punch biopsy at next visit. [facility]  #4 Acute renal insufficiency: could be due to dehydration.  Will check UA urine lytes ,urine microalbumin to creatine ratio. Will recheck BMP, phosphorus, changing from Truvada to Descovy  I spent greater than 40 minutes with the patient including greater than 50% of time in face to face  counsel of the patient re his HIV, histoplasmosis and ARI and in coordination of their care.

## 2014-07-16 LAB — SODIUM, URINE, RANDOM: Sodium, Ur: 281 mmol/L — ABNORMAL HIGH (ref 28–272)

## 2014-07-16 LAB — MICROALBUMIN / CREATININE URINE RATIO
Creatinine, Urine: 240.9 mg/dL
Microalb Creat Ratio: 9.5 mg/g (ref 0.0–30.0)
Microalb, Ur: 2.3 mg/dL — ABNORMAL HIGH (ref <2.0)

## 2014-08-06 ENCOUNTER — Ambulatory Visit: Payer: Self-pay

## 2014-09-15 ENCOUNTER — Ambulatory Visit: Payer: Self-pay | Admitting: Infectious Disease

## 2014-09-17 ENCOUNTER — Ambulatory Visit: Payer: Self-pay | Admitting: Infectious Disease

## 2015-01-06 ENCOUNTER — Ambulatory Visit: Payer: Self-pay

## 2015-04-08 ENCOUNTER — Other Ambulatory Visit: Payer: Self-pay

## 2015-04-08 ENCOUNTER — Ambulatory Visit: Payer: Self-pay

## 2015-04-08 ENCOUNTER — Other Ambulatory Visit: Payer: Self-pay | Admitting: *Deleted

## 2015-04-08 DIAGNOSIS — Z113 Encounter for screening for infections with a predominantly sexual mode of transmission: Secondary | ICD-10-CM

## 2015-04-08 DIAGNOSIS — Z79899 Other long term (current) drug therapy: Secondary | ICD-10-CM

## 2015-04-08 DIAGNOSIS — B20 Human immunodeficiency virus [HIV] disease: Secondary | ICD-10-CM

## 2015-04-08 MED ORDER — DARUNAVIR-COBICISTAT 800-150 MG PO TABS: 1.0000 | ORAL_TABLET | Freq: Every day | ORAL | Status: DC

## 2015-04-08 MED ORDER — EMTRICITABINE-TENOFOVIR AF 200-25 MG PO TABS: 1.0000 | ORAL_TABLET | Freq: Every day | ORAL | Status: DC

## 2015-04-20 ENCOUNTER — Other Ambulatory Visit: Payer: Self-pay | Admitting: Infectious Disease

## 2015-04-27 ENCOUNTER — Inpatient Hospital Stay (HOSPITAL_COMMUNITY)
Admission: EM | Admit: 2015-04-27 | Discharge: 2015-05-01 | DRG: 947 | Disposition: A | Discharge status: Home / Self Care | Payer: Self-pay | Attending: Internal Medicine | Admitting: Internal Medicine

## 2015-04-27 ENCOUNTER — Telehealth: Payer: Self-pay

## 2015-04-27 ENCOUNTER — Encounter: Payer: Self-pay | Admitting: Infectious Disease

## 2015-04-27 ENCOUNTER — Ambulatory Visit (INDEPENDENT_AMBULATORY_CARE_PROVIDER_SITE_OTHER): Payer: Self-pay | Admitting: Infectious Disease

## 2015-04-27 ENCOUNTER — Encounter (HOSPITAL_COMMUNITY): Payer: Self-pay | Admitting: Family Medicine

## 2015-04-27 VITALS — BP 100/69 | HR 62 | Temp 97.9°F | Wt 244.0 lb

## 2015-04-27 DIAGNOSIS — N179 Acute kidney failure, unspecified: Secondary | ICD-10-CM | POA: Diagnosis present

## 2015-04-27 DIAGNOSIS — N008 Acute nephritic syndrome with other morphologic changes: Secondary | ICD-10-CM

## 2015-04-27 DIAGNOSIS — B20 Human immunodeficiency virus [HIV] disease: Secondary | ICD-10-CM | POA: Diagnosis present

## 2015-04-27 DIAGNOSIS — N059 Unspecified nephritic syndrome with unspecified morphologic changes: Secondary | ICD-10-CM

## 2015-04-27 DIAGNOSIS — R21 Rash and other nonspecific skin eruption: Secondary | ICD-10-CM | POA: Diagnosis present

## 2015-04-27 DIAGNOSIS — B399 Histoplasmosis, unspecified: Secondary | ICD-10-CM | POA: Diagnosis present

## 2015-04-27 DIAGNOSIS — R601 Generalized edema: Principal | ICD-10-CM | POA: Diagnosis present

## 2015-04-27 DIAGNOSIS — R609 Edema, unspecified: Secondary | ICD-10-CM | POA: Diagnosis present

## 2015-04-27 DIAGNOSIS — I313 Pericardial effusion (noninflammatory): Secondary | ICD-10-CM | POA: Diagnosis present

## 2015-04-27 DIAGNOSIS — Z79899 Other long term (current) drug therapy: Secondary | ICD-10-CM

## 2015-04-27 DIAGNOSIS — J020 Streptococcal pharyngitis: Secondary | ICD-10-CM | POA: Diagnosis present

## 2015-04-27 DIAGNOSIS — N182 Chronic kidney disease, stage 2 (mild): Secondary | ICD-10-CM | POA: Diagnosis present

## 2015-04-27 DIAGNOSIS — E785 Hyperlipidemia, unspecified: Secondary | ICD-10-CM | POA: Diagnosis present

## 2015-04-27 HISTORY — DX: Chronic kidney disease, stage 2 (mild): N18.2

## 2015-04-27 HISTORY — DX: Edema, unspecified: R60.9

## 2015-04-27 HISTORY — DX: Unspecified nephritic syndrome with unspecified morphologic changes: N05.9

## 2015-04-27 LAB — CBC WITH DIFFERENTIAL/PLATELET
BASOS ABS: 0 10*3/uL (ref 0.0–0.1)
BASOS PCT: 0 %
EOS ABS: 0.2 10*3/uL (ref 0.0–0.7)
Eosinophils Relative: 6 %
HCT: 36 % — ABNORMAL LOW (ref 39.0–52.0)
HEMOGLOBIN: 12.3 g/dL — AB (ref 13.0–17.0)
Lymphocytes Relative: 16 %
Lymphs Abs: 0.6 10*3/uL — ABNORMAL LOW (ref 0.7–4.0)
MCH: 31.6 pg (ref 26.0–34.0)
MCHC: 34.2 g/dL (ref 30.0–36.0)
MCV: 92.5 fL (ref 78.0–100.0)
Monocytes Absolute: 0.2 10*3/uL (ref 0.1–1.0)
Monocytes Relative: 5 %
NEUTROS PCT: 73 %
Neutro Abs: 2.5 10*3/uL (ref 1.7–7.7)
Platelets: 172 10*3/uL (ref 150–400)
RBC: 3.89 MIL/uL — AB (ref 4.22–5.81)
RDW: 14.2 % (ref 11.5–15.5)
WBC: 3.4 10*3/uL — AB (ref 4.0–10.5)

## 2015-04-27 LAB — COMPREHENSIVE METABOLIC PANEL
ALBUMIN: 4.6 g/dL (ref 3.5–5.0)
ALK PHOS: 48 U/L (ref 38–126)
ALT: 44 U/L (ref 17–63)
ANION GAP: 10 (ref 5–15)
AST: 188 U/L — AB (ref 15–41)
BUN: 8 mg/dL (ref 6–20)
CALCIUM: 8.9 mg/dL (ref 8.9–10.3)
CO2: 27 mmol/L (ref 22–32)
Chloride: 94 mmol/L — ABNORMAL LOW (ref 101–111)
Creatinine, Ser: 1.44 mg/dL — ABNORMAL HIGH (ref 0.61–1.24)
GFR calc Af Amer: >60 mL/min (ref >60)
GFR calc non Af Amer: >60 mL/min (ref >60)
GLUCOSE: 89 mg/dL (ref 65–99)
Potassium: 3.7 mmol/L (ref 3.5–5.1)
SODIUM: 131 mmol/L — AB (ref 135–145)
Total Bilirubin: 1 mg/dL (ref 0.3–1.2)
Total Protein: 7.8 g/dL (ref 6.5–8.1)

## 2015-04-27 NOTE — Progress Notes (Signed)
INFECTIOUS DISEASE ATTENDING ADDENDUM:   Date: 04/27/2015   HPI  Chief complaint: diffuse edema after having had a throat infection   35 year old [place] gentleman with PMHX significant for HIV/AIDS and disseminated histoplasmosis then  with recurrent disseminated histoplasmosis with CNS involvement sp 2 weeks ampho (see prior notes) and sp itraconazole  for nearly 2 years.   He had not been seen since last summer but appears to have renewed his ADAP in October per Epic  He returns for care here having communicated with Kelby Fam via [employer] and in person. He had complaint of sore throat in past 3 weeks that he thought was due to an infection. He then began developing diffuse swelling of his arms, legs, face, eyelids and neck.  He has not taken any new medications or been exposed to any allergens. He claims to be making urine.  He is UNUSUALLY edematous throughout entire body. I am concerned that this may be a poststreptococcal glomerulonephritis with renal failure.   Past Medical History  Diagnosis Date  . HIV (human immunodeficiency virus infection) (HCC)   . 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 Possible recurrence 2014 (currently being evaluated 05/2012)  . Pancytopenia (HCC)   . Acute renal failure (HCC) 07/15/2014  . Edema 04/27/2015  . 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 No past surgeries      Family History  Problem Relation Age of Onset  . Coronary artery disease Mother     died from MI @ age 56 yo      Social History   Social History  . Marital Status: Single    Spouse Name: N/A  . Number of Children: 0  . Years of Education: 6th grade   Occupational History  . Landscaping    Social History Main Topics  . Smoking status: Never Smoker   . Smokeless tobacco: Never Used  . Alcohol Use: No  . Drug Use: No  . Sexual Activity:    Partners: Female       Comment: given condoms   Other Topics Concern  . None   Social History Narrative   Lives in [place] with his wife and 3 nephews/nieces. Is spanish speaking only - requiring interpretor. He is able to read and write in Spanish only    No Known Allergies   Current outpatient prescriptions:  .  emtricitabine-tenofovir AF (DESCOVY) 200-25 MG tablet, Take 1 tablet by mouth daily., Disp: 30 tablet, Rfl: 11 .  imiquimod (ALDARA) 5 % cream, Apply topically 3 (three) times a week., Disp: 12 each, Rfl: 2 .  acetaminophen (TYLENOL) 325 MG tablet, Take 1 tablet (325 mg total) by mouth every 6 (six) hours as needed. (Patient not taking: Reported on 04/27/2015), Disp: , Rfl:  .  PREZCOBIX 800-150 MG tablet, TAKE ONE TABLET BY MOUTH EVERY DAY (Patient not taking: Reported on 04/27/2015), Disp: 30 tablet, Rfl: 0   Review of Systems  Constitutional: Positive for fatigue and unexpected weight change. Negative for fever, chills, diaphoresis and appetite change.  HENT: Positive for sore throat and voice change. Negative for congestion, rhinorrhea, sinus pressure, sneezing and trouble swallowing.   Eyes: Negative for photophobia and visual disturbance.  Respiratory: Positive for cough. Negative for chest tightness, shortness of breath, wheezing and stridor.   Cardiovascular: Positive for leg swelling. Negative for chest pain and palpitations.  Gastrointestinal: Negative for nausea, vomiting, abdominal pain,  diarrhea, constipation, blood in stool, abdominal distention and anal bleeding.  Genitourinary: Negative for dysuria, hematuria, flank pain and difficulty urinating.  Musculoskeletal: Negative for myalgias, back pain, joint swelling, arthralgias and gait problem.  Skin: Positive for rash. Negative for pallor and wound.  Neurological: Negative for dizziness, tremors, weakness and light-headedness.  Hematological: Negative for adenopathy. Does not bruise/bleed easily.  Psychiatric/Behavioral:  Negative for behavioral problems, confusion, sleep disturbance, dysphoric mood, decreased concentration and agitation.     Physical Exam  Constitutional: He is oriented to person, place, and time. He appears well-nourished. No distress.  EDEMATOUS EYELIDS  HENT:  Head: Normocephalic and atraumatic.  Mouth/Throat: Oropharynx is clear and moist. No oropharyngeal exudate.    Eyes: Conjunctivae and EOM are normal. No scleral icterus.  Neck: Normal range of motion. Neck supple.  Cardiovascular: Normal rate, regular rhythm and normal heart sounds.  Exam reveals no gallop and no friction rub.   No murmur heard. Pulmonary/Chest: Effort normal and breath sounds normal. No respiratory distress. He has no wheezes. He has no rales. He exhibits no tenderness.  Abdominal: He exhibits no distension and no mass. There is no tenderness. There is no rebound and no guarding.  Musculoskeletal: He exhibits edema. He exhibits no tenderness.  Lymphadenopathy:    He has cervical adenopathy.  Neurological: He is alert and oriented to person, place, and time. He exhibits normal muscle tone. Coordination normal.  Skin: Skin is warm and dry. Rash noted. He is not diaphoretic. No erythema. No pallor.     Psychiatric: He has a normal mood and affect. His behavior is normal. Judgment and thought content normal.   Facial edema 04/27/15:      Skin: Still with pedunculated lesions on his fingers several visits ago    07/15/14:      Today's exam sig edema of arms, legs, hands, face  04/27/15:       Assessment/Plan:   #1 DIFFUSE EDEMA after sore throat "infection"  --I tried to directly admit the pt to [facility] but it is now with sig bed wait time that he will need to be seen in ED and triaged prior to direct admission to [facility]  NOTE [facility] has agreed to admit the patient but again he needs to be seen in ED urgently  I did order STAT labs in my clinic including  CMP, CBC w diff, other initial labs  including ASO, CH50, C3, C4  Certainly differential diagnosis could include an acute allergic reaction as well but the hx does not sound very good for that  cortisol excess would seem even less likely  I think he needs his ARV held, likely will need loop diuretics, if renal failure severe enough with electrolyte abnromalities may need HD and obviously if with ARF will need urgent Nephrology consultation for possiblle emergency HD  I could not visualize throat to see if he still has exudate c/w infection being present. iwould not be oppposed to giving him dose of IM PCN but did not want to cloud the picture at present and if he has post strep GN main issue will be managing the ARF  #2 HIV/AIDS: On prezcobix and truvada. Would hold given risk for TNF renal toxicity. Would change him to a Descovy based regimen if GFR recovers to greater than 30  #3 Disseminated hsitoplasmosis: hopefully cured but risk for relapse if off meds for sufficient amount of time   I spent greater than 40 minutes with the patient including greater than 50% of  time in face to face counsel of the patient re his whole body edema, recent throat pain and possible infection, possible ARF with possible Acute post streptococcal GN, HIV, histoplasmosis and in coordination of his care.  I will add him to the inpatient service and ask my partner Dr. Ninetta LightsHatcher to take a look at him tomorrow.

## 2015-04-27 NOTE — ED Notes (Signed)
Pt sent here from infectious disease doctor. Spoke with pt over translator phones and sts he went to see the doctor due to sore throat and facial and hand swelling. Note from infectious disease are in the computer. Pt haveing multiple problems and had blood drawn there. Sent here for admission.

## 2015-04-27 NOTE — Telephone Encounter (Signed)
Per Dr. Daiva EvesVan Dam request called and spoke with ER nurse Kim,RN explaining that we are sending over patient to be seen in the ER. Explained to nurse Dr. Daiva EvesVan Dam thinks patient may have streptococcal glomerulus nephritis due to his evaluation and patient is very edematous. Notified nurse patient will need an interpreter when he arrives.  Nurse explained patient will be triaged. Rejeana Brockandace Cielo Arias, LPN

## 2015-04-27 NOTE — ED Notes (Signed)
Translator used.   

## 2015-04-27 NOTE — ED Provider Notes (Signed)
By signing my name below, I, Freida BusmanDiana Omoyeni, attest that this documentation has been prepared under the direction and in the presence of Deazia Lampi N Rajanae Mantia, DO . Electronically Signed: Freida Busmaniana Omoyeni, Scribe. 04/28/2015. 12:44 AM.  TIME SEEN: 12:15 AM   CHIEF COMPLAINT:  Chief Complaint  Patient presents with  . Sore Throat  . Facial Swelling     HPI:  HPI Comments:  David Knapp is a 35 y.o. male with a history of HIV/AIDS, prior history of disseminated histoplasmosis who presents to the Emergency Department complaining of generalized swelling x 8-10 days. He also complains of cough, productive of white sputum. He denies h/o similar episodes. He also denies fever, rash, vomiting, diarrhea, SOB, and CP. No alleviating factors noted. Pt was sent to the ED by infectious disease Dr. Daiva EvesVan Dam.  He was concerned for possible post streptococcal glomerulonephritis. Felt that the patient would need loop diuretics and further workup in the hospital. He attempted to admit the patient directly but was unable to do so. Patient is on antiretroviral therapy.  Language interpreter used to obtain history.  ID: Daiva EvesVan Dam  ROS: See HPI Constitutional: no fever  Eyes: no drainage  ENT: no runny nose   Cardiovascular:  no chest pain  Resp: no SOB  GI: no vomiting GU: no dysuria Integumentary: no rash  Allergy: no hives  Musculoskeletal:  leg swelling  Neurological: no slurred speech ROS otherwise negative  PAST MEDICAL HISTORY/PAST SURGICAL HISTORY:  Past Medical History  Diagnosis Date  . HIV (human immunodeficiency virus infection) (HCC)   . 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 Possible recurrence 2014 (currently being evaluated 05/2012)  . Pancytopenia (HCC)   . Acute renal failure (HCC) 07/15/2014  . Edema 04/27/2015  . 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 Authorizing  Provider  acetaminophen (TYLENOL) 325 MG tablet Take 1 tablet (325 mg total) by mouth every 6 (six) hours as needed. Patient not taking: Reported on 04/27/2015 05/23/12   Genelle GatherKathryn F Glenn, MD  emtricitabine-tenofovir AF (DESCOVY) 200-25 MG tablet Take 1 tablet by mouth daily. 04/08/15   Gardiner Barefootobert W Comer, MD  imiquimod Mathis Dad(ALDARA) 5 % cream Apply topically 3 (three) times a week. 01/28/14   Randall Hissornelius N Van Dam, MD  PREZCOBIX 800-150 MG tablet TAKE ONE TABLET BY MOUTH EVERY DAY Patient not taking: Reported on 04/27/2015 04/21/15   Randall Hissornelius N Van Dam, MD    ALLERGIES:  No Known Allergies  SOCIAL HISTORY:  Social History  Substance Use Topics  . Smoking status: Never Smoker   . Smokeless tobacco: Never Used  . Alcohol Use: No    FAMILY HISTORY: Family History  Problem Relation Age of Onset  . Coronary artery disease Mother     died from MI @ age 36 yo    EXAM: BP 97/56 mmHg  Pulse 61  Temp(Src) 97.6 F (36.4 C) (Oral)  Resp 22  SpO2 99% CONSTITUTIONAL: Alert and oriented and responds appropriately to questions. Afebrile, nontoxic HEAD: Normocephalic EYES: Conjunctivae clear, PERRL, diffuse facial swelling, periorbital edema ENT: normal nose; no rhinorrhea; moist mucous membranes; patient does have a large tongue but does not appear to be significantly edematous, I am unable to visualize his posterior oropharynx, he has normal phonation, swelling with out difficulty, no drooling, no trismus, no sign of dental abscess, no Ludwig's angina NECK: Supple, no meningismus, no LAD  CARD: RRR; S1 and  S2 appreciated; no murmurs, no clicks, no rubs, no gallops RESP: Normal chest excursion without splinting or tachypnea; breath sounds clear and equal bilaterally; no wheezes, no rhonchi, no rales, no hypoxia or respiratory distress, speaking full sentences ABD/GI: Normal bowel sounds; non-distended; soft, non-tender, no rebound, no guarding, no peritoneal signs BACK:  The back appears normal and is  non-tender to palpation, there is no CVA tenderness EXT: Normal ROM in all joints; non-tender to palpation; nonpitting edema in bilateral upper and lower extremities; normal capillary refill; no cyanosis, no calf tenderness or swelling    SKIN: Normal color for age and race; warm; patient has warts to his hands and feet which are chronic NEURO: Moves all extremities equally, sensation to light touch intact diffusely, cranial nerves II through XII intact PSYCH: The patient's mood and manner are appropriate. Grooming and personal hygiene are appropriate.  MEDICAL DECISION MAKING: Patient here with swelling to his face, extremities. Patient sent for admission to rule out possible post streptococcal glomerulonephritis. Labs obtained by infectious disease Dr. show creatinine of 1.4. We'll give IV Lasix. We have also swabbed his throat and will send a strep swab. We'll add on chest x-ray given his complaints of cough, troponin, BNP. EKG shows new diffuse ST flattening compared to prior. He denies a chest pain or shortness of breath currently. He is hemodynamically stable.  ED PROGRESS: 2:00 AM  Pt's BMP and troponin are normal. Chest x-ray shows minimal bibasilar opacities that I feel reflect interstitial edema over pneumonia. He is strep positive. We'll treat with IM penicillin. Myself and infectious disease feel that this is likely post streptococcal glomerulonephritis. Will admit to medicine. He has received IV Lasix.  2:15 AM  Discussed patient's case with hospitalist, Dr. Clyde Lundborg.  Recommend admission to telemetry, inpatient bed.  I will place holding orders per their request. Patient and family (if present) updated with plan. Care transferred to hospitalist service.  Of note, patient's urine has come back that was obtained earlier today and shows no protein or blood which suggests against glomerulonephritis. He has never had an echocardiogram.  I reviewed all nursing notes, vitals, pertinent old records,  EKGs, labs, imaging (as available).       EKG Interpretation  Date/Time:  Wednesday April 28 2015 00:36:25 EDT Ventricular Rate:  61 PR Interval:    QRS Duration: 106 QT Interval:  430 QTC Calculation: 433 R Axis:   139 Text Interpretation:  Normal sinus rhythm Right axis deviation Nonspecific ST flattening diffusely that is new compared to prior EKG Confirmed by Siraj Dermody,  DO, Orvis Stann ([id number]) on 04/28/2015 12:45:52 AM        I personally performed the services described in this documentation, which was scribed in my presence. The recorded information has been reviewed and is accurate.   Layla Maw Alice Vitelli, DO 04/28/15 [phone]

## 2015-04-27 NOTE — ED Notes (Signed)
Patient only speaks [place]

## 2015-04-28 ENCOUNTER — Encounter (HOSPITAL_COMMUNITY): Payer: Self-pay | Admitting: Internal Medicine

## 2015-04-28 ENCOUNTER — Emergency Department (HOSPITAL_COMMUNITY): Payer: Self-pay

## 2015-04-28 DIAGNOSIS — Z8619 Personal history of other infectious and parasitic diseases: Secondary | ICD-10-CM

## 2015-04-28 DIAGNOSIS — N182 Chronic kidney disease, stage 2 (mild): Secondary | ICD-10-CM | POA: Diagnosis present

## 2015-04-28 DIAGNOSIS — J020 Streptococcal pharyngitis: Secondary | ICD-10-CM | POA: Insufficient documentation

## 2015-04-28 DIAGNOSIS — B20 Human immunodeficiency virus [HIV] disease: Secondary | ICD-10-CM

## 2015-04-28 DIAGNOSIS — R601 Generalized edema: Secondary | ICD-10-CM | POA: Insufficient documentation

## 2015-04-28 DIAGNOSIS — N179 Acute kidney failure, unspecified: Secondary | ICD-10-CM

## 2015-04-28 DIAGNOSIS — B399 Histoplasmosis, unspecified: Secondary | ICD-10-CM

## 2015-04-28 LAB — COMPREHENSIVE METABOLIC PANEL
ALBUMIN: 4.5 g/dL (ref 3.5–5.0)
ALT: 44 U/L (ref 17–63)
ANION GAP: 12 (ref 5–15)
AST: 185 U/L — ABNORMAL HIGH (ref 15–41)
Alkaline Phosphatase: 52 U/L (ref 38–126)
BILIRUBIN TOTAL: 1.3 mg/dL — AB (ref 0.3–1.2)
BUN: 6 mg/dL (ref 6–20)
CO2: 26 mmol/L (ref 22–32)
Calcium: 8.9 mg/dL (ref 8.9–10.3)
Chloride: 96 mmol/L — ABNORMAL LOW (ref 101–111)
Creatinine, Ser: 1.45 mg/dL — ABNORMAL HIGH (ref 0.61–1.24)
GFR calc non Af Amer: >60 mL/min (ref >60)
GLUCOSE: 98 mg/dL (ref 65–99)
POTASSIUM: 3.5 mmol/L (ref 3.5–5.1)
Sodium: 134 mmol/L — ABNORMAL LOW (ref 135–145)
TOTAL PROTEIN: 8.1 g/dL (ref 6.5–8.1)

## 2015-04-28 LAB — URINALYSIS, ROUTINE W REFLEX MICROSCOPIC
Bilirubin Urine: NEGATIVE
Glucose, UA: NEGATIVE
HGB URINE DIPSTICK: NEGATIVE
KETONES UR: NEGATIVE
Leukocytes, UA: NEGATIVE
NITRITE: NEGATIVE
Protein, ur: NEGATIVE
Specific Gravity, Urine: 1.009 (ref 1.001–1.035)
pH: 7 (ref 5.0–8.0)

## 2015-04-28 LAB — CBC
HEMATOCRIT: 35.3 % — AB (ref 39.0–52.0)
HEMOGLOBIN: 12.1 g/dL — AB (ref 13.0–17.0)
MCH: 31.6 pg (ref 26.0–34.0)
MCHC: 34.3 g/dL (ref 30.0–36.0)
MCV: 92.2 fL (ref 78.0–100.0)
Platelets: 161 10*3/uL (ref 150–400)
RBC: 3.83 MIL/uL — AB (ref 4.22–5.81)
RDW: 14.1 % (ref 11.5–15.5)
WBC: 3.5 10*3/uL — AB (ref 4.0–10.5)

## 2015-04-28 LAB — BRAIN NATRIURETIC PEPTIDE: B Natriuretic Peptide: 6.8 pg/mL (ref 0.0–100.0)

## 2015-04-28 LAB — LIPID PANEL
CHOL/HDL RATIO: 5.6 ratio
CHOLESTEROL: 309 mg/dL — AB (ref 0–200)
HDL: 55 mg/dL (ref >40)
LDL CALC: 204 mg/dL — AB (ref 0–99)
TRIGLYCERIDES: 250 mg/dL — AB (ref <150)
VLDL: 50 mg/dL — AB (ref 0–40)

## 2015-04-28 LAB — CORTISOL: CORTISOL PLASMA: 13.2 ug/dL

## 2015-04-28 LAB — TSH: TSH: >90 u[IU]/mL — ABNORMAL HIGH (ref 0.350–4.500)

## 2015-04-28 LAB — RAPID STREP SCREEN (MED CTR MEBANE ONLY): Streptococcus, Group A Screen (Direct): POSITIVE — AB

## 2015-04-28 LAB — TROPONIN I: Troponin I: <0.03 ng/mL (ref <0.031)

## 2015-04-28 LAB — PROTIME-INR
INR: 0.96 (ref 0.00–1.49)
PROTHROMBIN TIME: 13 s (ref 11.6–15.2)

## 2015-04-28 MED ORDER — PENICILLIN G BENZATHINE 1200000 UNIT/2ML IM SUSP: 1.2000 10*6.[IU] | Freq: Once | INTRAMUSCULAR | Status: DC

## 2015-04-28 MED ORDER — ONDANSETRON HCL 4 MG PO TABS: 4.0000 mg | ORAL_TABLET | Freq: Four times a day (QID) | ORAL | Status: DC | PRN

## 2015-04-28 MED ORDER — PENICILLIN G BENZATHINE 1200000 UNIT/2ML IM SUSP
1.2000 10*6.[IU] | Freq: Once | INTRAMUSCULAR | Status: AC
Start: 2015-04-28 — End: 2015-04-28
  Administered 2015-04-28: 1.2 10*6.[IU] via INTRAMUSCULAR
  Filled 2015-04-28: qty 2

## 2015-04-28 MED ORDER — ACETAMINOPHEN 325 MG PO TABS: 650.0000 mg | ORAL_TABLET | Freq: Four times a day (QID) | ORAL | Status: DC | PRN

## 2015-04-28 MED ORDER — ACETAMINOPHEN 650 MG RE SUPP: 650.0000 mg | Freq: Four times a day (QID) | RECTAL | Status: DC | PRN

## 2015-04-28 MED ORDER — FUROSEMIDE 10 MG/ML IJ SOLN
40.0000 mg | Freq: Once | INTRAMUSCULAR | Status: AC
  Administered 2015-04-28: 40 mg via INTRAVENOUS
  Filled 2015-04-28: qty 4

## 2015-04-28 MED ORDER — ENOXAPARIN SODIUM 40 MG/0.4ML ~~LOC~~ SOLN
40.0000 mg | SUBCUTANEOUS | Status: DC
  Administered 2015-04-28 – 2015-05-01 (×4): 40 mg via SUBCUTANEOUS
  Filled 2015-04-28 (×4): qty 0.4

## 2015-04-28 MED ORDER — ONDANSETRON HCL 4 MG/2ML IJ SOLN: 4.0000 mg | Freq: Four times a day (QID) | INTRAMUSCULAR | Status: DC | PRN

## 2015-04-28 MED ORDER — EMTRICITABINE-TENOFOVIR AF 200-25 MG PO TABS
1.0000 | ORAL_TABLET | Freq: Every day | ORAL | Status: DC
  Administered 2015-04-28 – 2015-05-01 (×4): 1 via ORAL
  Filled 2015-04-28 (×4): qty 1

## 2015-04-28 MED ORDER — IMIQUIMOD 5 % EX CREA: TOPICAL_CREAM | CUTANEOUS | Status: DC

## 2015-04-28 MED ORDER — FUROSEMIDE 10 MG/ML IJ SOLN
40.0000 mg | Freq: Two times a day (BID) | INTRAMUSCULAR | Status: DC
  Administered 2015-04-28: 40 mg via INTRAVENOUS
  Filled 2015-04-28: qty 4

## 2015-04-28 MED ORDER — DARUNAVIR-COBICISTAT 800-150 MG PO TABS
1.0000 | ORAL_TABLET | Freq: Every day | ORAL | Status: DC
  Administered 2015-04-28 – 2015-04-29 (×2): 1 via ORAL
  Filled 2015-04-28 (×3): qty 1

## 2015-04-28 MED ORDER — SODIUM CHLORIDE 0.9% FLUSH
3.0000 mL | Freq: Two times a day (BID) | INTRAVENOUS | Status: DC
  Administered 2015-04-28 – 2015-05-01 (×7): 3 mL via INTRAVENOUS

## 2015-04-28 MED ORDER — FUROSEMIDE 10 MG/ML IJ SOLN: 40.0000 mg | Freq: Every day | INTRAMUSCULAR | Status: DC

## 2015-04-28 NOTE — Progress Notes (Signed)
Patient seen and examined, admitted by Dr. Clyde LundborgNiu this am  Briefly 35 year old male with HIV/AIDS, recurrent disseminated histoplasmosis with CNS involvement (s/p of itraconazole for nearly 2 years per Dr. 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 In the ED, creatinine 1.4, UA negative for UTI, protein, patient was positive for rapid strep. He was admitted for further treatment.  BP 96/58 mmHg  Pulse 66  Temp(Src) 97.7 F (36.5 C) (Oral)  Resp 16  Ht 5' 8.5" (1.74 m)  Wt 108.228 kg (238 lb 9.6 oz)  BMI 35.75 kg/m2  SpO2 96%  Generalized edema/anasarca  - Albumin 4.5, even though patient has positive rapid strep throat, UA is negative for any proteinuria or Hgb, not  consistent with PSGN. this diagnosis. AST elevated.  - Obtain 2-D echocardiogram, BNP is however 6.8 but patient is obese  - Given borderline BP, reduced IV Lasix to 40 mg daily, patient is Lasix nave - Follow Hepatitis panel, ASO, CH50, C3 and C4   Strep-A throat: -give one dose of Bicillin IM -f/u Bx  Possible CKD-II: Patient's creatinine was 1.46 on 07/15/14. His creatinine is 1.44 on admission,  currently at baseline  - follow creatinine function closely with the diuresis  Rashes: -Continue Imiquimod cream  HIV/AIDS: Follow-up by Dr. Algis LimingVanDam. CD4 was 210 and VL<20 on 07/01/14. On prezcobix and truvada. Appreciate Dr. Moshe CiproHatcher's recommendation, ART restarted, change truvada to descovy  Hx of Disseminated hsitoplasmosis: s/p of itraconazole for nearly 2 years per Dr. Lattie HawVandam's note.  - Per ID  DVT prophylaxis- Lovenox   Nyaja Dubuque M.D. [facility] 04/28/2015, 12:27 PM  Pager: [phone]

## 2015-04-28 NOTE — H&P (Signed)
History and Physical    David Knapp ZOX:[id number] DOB: 1980/11/10 DOA: 04/27/2015  Referring MD/NP/PA:   PCP: No PCP Per Patient   Outpatient Specialists: ID, Dr. Algis Liming  Patient coming from:  Home   Chief Complaint: Sore throat and diffused edema  HPI: David Knapp is a 35 y.o. male with medical history significant of HIV/AIDS, recurrent disseminated histoplasmosis with CNS involvement (s/p of itraconazole for nearly 2 years per Dr. 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  Patient initially presented to Dr. Lattie Haw office. He had complaint of sore throat in past 3 weeks. He then began developing diffuse swelling of his arms, legs, face, eyelids and neck. He has not taken any new medications or been exposed to any allergens. He still make urine, did not see hematuria or urine color change. Patient has mild cough, but no chest pain, fever or chills. No nausea, vomiting, abdominal pain, diarrhea or symptoms of UTI. No unilateral weakness. With concern of possible poststreptococcal glomerulonephritis, Dr. Algis Liming sent patient to the emergency room for further evaluation and treatment.  ED Course: pt was found to have negative troponin, BNP 6.8, WBCs 3.4, temperature normal, bradycardia, slightly tachypnea, sodium 131, creatinine 1.44 which was 1.46 on 03/15/14. Urinalysis is negative for UTI, protein and hgb. Patient is positive for rapid strep today. Patient is admitted to inpatient for further intervention treatment.  Review of Systems:   General: no fevers, chills, no changes in body weight, has poor appetite, has fatigue HEENT: no blurry vision. Has sore throat Pulm: no dyspnea, coughing, wheezing CV: no chest pain, no palpitations Abd: no nausea, vomiting, abdominal pain, diarrhea, constipation GU: no dysuria, burning on urination, increased urinary frequency, hematuria  Ext:  has diffused edema Neuro: no unilateral weakness, numbness, or tingling, no vision change or hearing loss Skin: no rash MSK: No muscle spasm, no deformity, no limitation of range of movement in spin Heme: No easy bruising.  Travel history: No recent long distant travel.  Allergy: No Known Allergies  Past Medical History  Diagnosis Date  . HIV (human immunodeficiency virus infection) (HCC)   . 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 Possible recurrence 2014 (currently being evaluated 05/2012)  . Pancytopenia (HCC)   . Edema 04/27/2015  . 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 CKD (chronic kidney disease), stage II     Past Surgical History  Procedure Laterality Date  . No past surgeries      Social History:  reports that he has never smoked. He has never used smokeless tobacco. He reports that he does not drink alcohol or use illicit drugs.  Family History:  Family History  Problem Relation Age of Onset  . Coronary artery disease Mother     died from MI @ age 40 yo     Prior to Admission medications   Medication Sig Start Date End Date Taking? Authorizing Provider  emtricitabine-tenofovir AF (DESCOVY) 200-25 MG tablet Take 1 tablet by mouth daily. 04/08/15  Yes Gardiner Barefoot, MD  PREZCOBIX 800-150 MG tablet TAKE ONE TABLET BY MOUTH EVERY DAY 04/21/15  Yes Randall Hiss, MD  acetaminophen (TYLENOL) 325 MG tablet Take 1 tablet (325 mg total) by mouth every 6 (six) hours as needed. Patient not taking: Reported on 04/27/2015 05/23/12   Genelle Gather, MD  imiquimod Mathis Dad) 5 % cream Apply topically 3 (three) times a week. Patient not taking: Reported on  04/27/2015 01/28/14   Randall Hissornelius N Van Dam, MD    Physical Exam: Filed Vitals:   04/28/15 0215 04/28/15 0230 04/28/15 0245 04/28/15 0351  BP: 107/57 104/61 101/57 105/59  Pulse: 57 58 63 62  Temp:    97.7 F (36.5 C)  TempSrc:    Oral  Resp: 27 20 26 18   Weight:    108.228 kg (238 lb 9.6  oz)  SpO2: 94% 93% 93% 98%   General: Not in acute distress HEENT:       Eyes: PERRL, EOMI, no scleral icterus.       ENT: No discharge from the ears and nose, no pharynx injection, no tonsillar enlargement.        Neck: No JVD, no bruit, no mass felt. Heme: No neck lymph node enlargement. Cardiac: S1/S2, RRR, No murmurs, No gallops or rubs. Pulm:  No rales, wheezing, rhonchi or rubs. Abd: Soft, nondistended, nontender, no rebound pain, no organomegaly, BS present. GU: No hematuria Ext: 2+ pitting leg edema bilaterally. 2+DP/PT pulse bilaterally. Musculoskeletal: No joint deformities, No joint redness or warmth, no limitation of ROM in spin. Skin: has diffused rashes, ?molluscum contagiosum Neuro: Alert, oriented X3, cranial nerves II-XII grossly intact, moves all extremities normally. Psych: Patient is not psychotic, no suicidal or hemocidal ideation.  Labs on Admission: I have personally reviewed following labs and imaging studies  CBC:  Recent Labs Lab 04/27/15 1620 04/28/15 0303  WBC 3.4* 3.5*  NEUTROABS 2.5  --   HGB 12.3* 12.1*  HCT 36.0* 35.3*  MCV 92.5 92.2  PLT 172 161   Basic Metabolic Panel:  Recent Labs Lab 04/27/15 1620 04/28/15 0303  NA 131* 134*  K 3.7 3.5  CL 94* 96*  CO2 27 26  GLUCOSE 89 98  BUN 8 6  CREATININE 1.44* 1.45*  CALCIUM 8.9 8.9   GFR: CrCl cannot be calculated (Unknown ideal weight.). Liver Function Tests:  Recent Labs Lab 04/27/15 1620 04/28/15 0303  AST 188* 185*  ALT 44 44  ALKPHOS 48 52  BILITOT 1.0 1.3*  PROT 7.8 8.1  ALBUMIN 4.6 4.5   No results for input(s): LIPASE, AMYLASE in the last 168 hours. No results for input(s): AMMONIA in the last 168 hours. Coagulation Profile:  Recent Labs Lab 04/28/15 0303  INR 0.96   Cardiac Enzymes:  Recent Labs Lab 04/28/15 0118  TROPONINI <0.03   BNP (last 3 results) No results for input(s): PROBNP in the last 8760 hours. HbA1C: No results for input(s): HGBA1C in  the last 72 hours. CBG: No results for input(s): GLUCAP in the last 168 hours. Lipid Profile:  Recent Labs  04/28/15 0303  CHOL 309*  HDL 55  LDLCALC 204*  TRIG 250*  CHOLHDL 5.6   Thyroid Function Tests:  Recent Labs  04/28/15 0303  TSH >90.000*   Anemia Panel: No results for input(s): VITAMINB12, FOLATE, FERRITIN, TIBC, IRON, RETICCTPCT in the last 72 hours. Urine analysis:    Component Value Date/Time   COLORURINE YELLOW 04/27/2015 1635   APPEARANCEUR CLEAR 04/27/2015 1635   LABSPEC 1.009 04/27/2015 1635   PHURINE 7.0 04/27/2015 1635   GLUCOSEU NEGATIVE 04/27/2015 1635   HGBUR NEGATIVE 04/27/2015 1635   BILIRUBINUR NEGATIVE 04/27/2015 1635   KETONESUR NEGATIVE 04/27/2015 1635   PROTEINUR NEGATIVE 04/27/2015 1635   UROBILINOGEN 1.0 05/15/2012 0640   NITRITE NEGATIVE 04/27/2015 1635   LEUKOCYTESUR NEGATIVE 04/27/2015 1635   Sepsis Labs: @LABRCNTIP (procalcitonin:4,lacticidven:4) ) Recent Results (from the past 240 hour(s))  Rapid strep screen  Status: Abnormal   Collection Time: 04/28/15 12:29 AM  Result Value Ref Range Status   Streptococcus, Group A Screen (Direct) POSITIVE (A) NEGATIVE Final     Radiological Exams on Admission: Dg Chest 2 View  04/28/2015  CLINICAL DATA:  Acute onset of cough and soft tissue edema. Initial encounter. EXAM: CHEST  2 VIEW COMPARISON:  Chest radiograph performed 05/15/2012 FINDINGS: The lungs are hypoexpanded. Mild vascular crowding and vascular congestion are seen. Minimal bibasilar opacities may reflect mild interstitial edema or possibly pneumonia. There is no evidence of pleural effusion or pneumothorax. The cardiomediastinal silhouette is borderline enlarged. No acute osseous abnormalities are seen. IMPRESSION: Lungs hypoexpanded. Mild vascular congestion and borderline cardiomegaly. Minimal bibasilar opacities may reflect mild interstitial edema or possibly pneumonia. Electronically Signed   By: Roanna Raider M.D.   On:  04/28/2015 01:39     EKG: Independently reviewed. QTC 433, bradycardia, nonspecific T-wave change  Assessment/Plan Principal Problem:   Edema Active Problems:   Human immunodeficiency virus (HIV) disease (HCC)   Disseminated histoplasmosis   AIDS (HCC)   Acute renal failure (HCC)   Post-streptococcal glomerulonephritis   Strep throat   CKD (chronic kidney disease), stage II   Edema: pt has diffused edema. Etiology is not clear. Given positive rapid strep throat, per Dr. Algis Liming, concerning for PSGN, but UA is negative for protein and Hgb, not very consistent with this diagnosis. Patient's renal function seems to be stable. He has abnormal liver function with an elevated AST 188, but AST and TBR are normal. His albumin is 4.6, which is unlikely to cause edema. The potential differential diagnosis is congestive heart failure. His BNP is 6.8. Given his obesity, this number could be falsely low.  -Will admit to tele bed -IV lasix 40 mg bid -check 2d echo to r/u CHF -check Hepatitis panel  -follow up ASO, CH50, C3 and C4 which were ordered by Dr .Algis Liming.  Strep-A throat: -give one dose of Bicillin IM -f/u Bx  Possible CKD-II: Patient's creatinine was 1.46 on 07/15/14. His creatinine is 1.44 on admission, which is similar to previous creatinine. -Follow-up renal function by BMP  Rashes: -Continue Imiquimod cream  HIV/AIDS: Follow-up by Dr. Algis Liming. CD4 was 210 and VL<20 on 07/01/14. On prezcobix and truvada. -Per Dr. Algis Liming, will hold given risk for TNF renal toxicity. Would change him to a Descovy based regimen if GFR recovers to greater than 30 -per Dr. Lattie Haw note, Dr. Ninetta Lights will see patient today, will follow-up recommendations.  Hx of Disseminated hsitoplasmosis: s/p of itraconazole for nearly 2 years per Dr. Lattie Haw note. "Hopefully cured but risk for relapse if off meds for sufficient amount of time" per Dr. Algis Liming.  DVT ppx:  SQ Lovenox Code Status: Full code Family  Communication: None at bed side.  Disposition Plan:  Anticipate discharge back to previous home environment Consults called:  Per Dr. Lattie Haw note, Dr. Ninetta Lights will see patient in the morning Admission status: Obs / tele   Date of Service 04/28/2015    Lorretta Harp [facility] Pager [phone]  If 7PM-7AM, please contact night-coverage [email or web address] Password [facility] 04/28/2015, 6:27 AM

## 2015-04-28 NOTE — Care Management Note (Signed)
Case Management Note  Patient Details  Name: Eliane DecreeJavier Rohman MRN: [id number] Date of Birth: August 13, 1980  Subjective/Objective:   Pt admitted with Strep - facial swelling                 Action/Plan:  CM used Spanish speaking interpretor to assess pt discharge needs;  Pt is active with Infectious Disease - sees Dr Daiva EvesVan Dam on a regular basis.  [facility] and through interpretor CM was informed that pt prefers to continue to utilize Dr Daiva EvesVan Dam as PCP.  [facility] and denied hardship with obtaining meds.  Pt stated that he was independent prior to discharge - no discharge needs communicated.  CM will continue to follow for disposition needs.  CM contacted financial counseling to inquire if pt can qualify for medication due to HIV status - medicaid application is already in process.   Expected Discharge Date:                  Expected Discharge Plan:  Home/Self Care  In-House Referral:     Discharge planning Services  CM Consult  Post Acute Care Choice:    Choice offered to:     DME Arranged:    DME Agency:     HH Arranged:    HH Agency:     Status of Service:  In process, will continue to follow  Medicare Important Message Given:    Date Medicare IM Given:    Medicare IM give by:    Date Additional Medicare IM Given:    Additional Medicare Important Message give by:     If discussed at Long Length of Stay Meetings, dates discussed:    Additional Comments:  Cherylann ParrClaxton, Sharrieff Spratlin S, RN 04/28/2015, 10:13 AM

## 2015-04-28 NOTE — Progress Notes (Signed)
INFECTIOUS DISEASE PROGRESS NOTE  ID: David Knapp is a 35 y.o. male with  Principal Problem:   Edema Active Problems:   Human immunodeficiency virus (HIV) disease (HCC)   Disseminated histoplasmosis   AIDS (HCC)   Acute renal failure (HCC)   Post-streptococcal glomerulonephritis   Strep throat   CKD (chronic kidney disease), stage II  Subjective: 35 yo M with hx of AIDS (on TRV/DRVc), prev CNS histo, CKD2,  recent URI 3 weeks prior and now development of diffuse edema.  He was adm with concern for acute post-strep GN. He was started on PEN G. In ED he was found to have normal BNP and his Cr was unchanged from previous (1.46 ---> 1.44) His rapid strep was (+).  His UA was negative for protein and Hgb. He was started on lasix.  He denies new medications. He works in Aeronautical engineer.  He denies difficulty eating, SOB.   Abtx:  Anti-infectives    Start     Dose/Rate Route Frequency Ordered Stop   04/28/15 0300  penicillin g benzathine (BICILLIN LA) 1200000 UNIT/2ML injection 1.2 Million Units     1.2 Million Units Intramuscular  Once 04/28/15 0249 04/28/15 0313   04/28/15 0130  penicillin g benzathine (BICILLIN LA) 1200000 UNIT/2ML injection 1.2 Million Units  Status:  Discontinued     1.2 Million Units Intramuscular  Once 04/28/15 0121 04/28/15 0240      Medications:  Scheduled: . enoxaparin (LOVENOX) injection  40 mg Subcutaneous Q24H  . furosemide  40 mg Intravenous BID  . sodium chloride flush  3 mL Intravenous Q12H    Objective: Vital signs in last 24 hours: Temp:  [97.6 F (36.4 C)-98.1 F (36.7 C)] 97.7 F (36.5 C) (04/26 0351) Pulse Rate:  [53-96] 62 (04/26 0351) Resp:  [13-27] 18 (04/26 0351) BP: (96-126)/(53-81) 105/59 mmHg (04/26 0351) SpO2:  [93 %-99 %] 98 % (04/26 0351) Weight:  [108.228 kg (238 lb 9.6 oz)-110.678 kg (244 lb)] 108.228 kg (238 lb 9.6 oz) (04/26 0351)   General appearance: alert, cooperative and no distress Resp: clear to auscultation  bilaterally Cardio: regular rate and rhythm GI: normal findings: bowel sounds normal and soft, non-tender Extremities: edema diffuse Skin: diffuse papilloma  Lab Results  Recent Labs  04/27/15 1620 04/28/15 0303  WBC 3.4* 3.5*  HGB 12.3* 12.1*  HCT 36.0* 35.3*  NA 131* 134*  K 3.7 3.5  CL 94* 96*  CO2 27 26  BUN 8 6  CREATININE 1.44* 1.45*   Liver Panel  Recent Labs  04/27/15 1620 04/28/15 0303  PROT 7.8 8.1  ALBUMIN 4.6 4.5  AST 188* 185*  ALT 44 44  ALKPHOS 48 52  BILITOT 1.0 1.3*   Sedimentation Rate No results for input(s): ESRSEDRATE in the last 72 hours. C-Reactive Protein No results for input(s): CRP in the last 72 hours.  Microbiology: Recent Results (from the past 240 hour(s))  Rapid strep screen     Status: Abnormal   Collection Time: 04/28/15 12:29 AM  Result Value Ref Range Status   Streptococcus, Group A Screen (Direct) POSITIVE (A) NEGATIVE Final    Studies/Results: Dg Chest 2 View  04/28/2015  CLINICAL DATA:  Acute onset of cough and soft tissue edema. Initial encounter. EXAM: CHEST  2 VIEW COMPARISON:  Chest radiograph performed 05/15/2012 FINDINGS: The lungs are hypoexpanded. Mild vascular crowding and vascular congestion are seen. Minimal bibasilar opacities may reflect mild interstitial edema or possibly pneumonia. There is no evidence of pleural effusion or  pneumothorax. The cardiomediastinal silhouette is borderline enlarged. No acute osseous abnormalities are seen. IMPRESSION: Lungs hypoexpanded. Mild vascular congestion and borderline cardiomegaly. Minimal bibasilar opacities may reflect mild interstitial edema or possibly pneumonia. Electronically Signed   By: Roanna RaiderJeffery  Chang M.D.   On: 04/28/2015 01:39     Assessment/Plan: HIV+ Edema Hx of Histoplasmosis  Await CH50, ASO, C3/4 Would consider trial of steroids Will restart his ART, change truvada to descovy Defer restarting itra for now.  Dr Daiva EvesVan Dam returns in AM  Total days of  antibiotics: none         David Knapp Infectious Diseases (pager) [phone] [email or web address] 04/28/2015, 8:15 AM  LOS: 0 days

## 2015-04-29 ENCOUNTER — Other Ambulatory Visit (HOSPITAL_COMMUNITY): Payer: Self-pay

## 2015-04-29 ENCOUNTER — Inpatient Hospital Stay (HOSPITAL_COMMUNITY): Payer: MEDICAID

## 2015-04-29 DIAGNOSIS — R600 Localized edema: Secondary | ICD-10-CM

## 2015-04-29 DIAGNOSIS — R0600 Dyspnea, unspecified: Secondary | ICD-10-CM

## 2015-04-29 LAB — BASIC METABOLIC PANEL
ANION GAP: 10 (ref 5–15)
BUN: 13 mg/dL (ref 6–20)
CO2: 29 mmol/L (ref 22–32)
Calcium: 9.5 mg/dL (ref 8.9–10.3)
Chloride: 96 mmol/L — ABNORMAL LOW (ref 101–111)
Creatinine, Ser: 1.75 mg/dL — ABNORMAL HIGH (ref 0.61–1.24)
GFR calc Af Amer: 56 mL/min — ABNORMAL LOW (ref >60)
GFR, EST NON AFRICAN AMERICAN: 48 mL/min — AB (ref >60)
Glucose, Bld: 94 mg/dL (ref 65–99)
Potassium: 3.6 mmol/L (ref 3.5–5.1)
SODIUM: 135 mmol/L (ref 135–145)

## 2015-04-29 LAB — CBC
HEMATOCRIT: 36.6 % — AB (ref 39.0–52.0)
HEMOGLOBIN: 12.3 g/dL — AB (ref 13.0–17.0)
MCH: 31.4 pg (ref 26.0–34.0)
MCHC: 33.6 g/dL (ref 30.0–36.0)
MCV: 93.4 fL (ref 78.0–100.0)
Platelets: 184 10*3/uL (ref 150–400)
RBC: 3.92 MIL/uL — AB (ref 4.22–5.81)
RDW: 14.3 % (ref 11.5–15.5)
WBC: 4.1 10*3/uL (ref 4.0–10.5)

## 2015-04-29 LAB — ECHOCARDIOGRAM COMPLETE
HEIGHTINCHES: 68.5 in
Weight: 3737.6 oz

## 2015-04-29 LAB — T-HELPER CELL (CD4) - (RCID CLINIC ONLY)
CD4 T CELL HELPER: 22 % — AB (ref 33–55)
CD4 T Cell Abs: 120 /uL — ABNORMAL LOW (ref 400–2700)

## 2015-04-29 LAB — HEPATITIS PANEL, ACUTE
HEP A IGM: NEGATIVE
HEP B S AG: NEGATIVE
Hep B C IgM: NEGATIVE

## 2015-04-29 LAB — HIV-1 RNA ULTRAQUANT REFLEX TO GENTYP+
HIV 1 RNA QUANT: 22290 {copies}/mL — AB (ref <20)
HIV-1 RNA Quant, Log: 4.35 Log copies/mL — ABNORMAL HIGH (ref <1.30)

## 2015-04-29 LAB — HEMOGLOBIN A1C
Hgb A1c MFr Bld: 6 % — ABNORMAL HIGH (ref 4.8–5.6)
MEAN PLASMA GLUCOSE: 126 mg/dL

## 2015-04-29 LAB — ANTISTREPTOLYSIN O TITER: ASO: 245 [IU]/mL — AB (ref <=200)

## 2015-04-29 NOTE — Progress Notes (Signed)
Please see Physician Sticky Note - pt has been voiding in urinal, but then flushing, so we have been unable to measure otuput.  After speaking with interpreter, pt now understands that we need to measure his urine output.  Pt stated that he has voided apprx 4-5x today, 04/29/15.

## 2015-04-29 NOTE — Progress Notes (Signed)
  Echocardiogram 2D Echocardiogram has been performed.  Leta JunglingCooper, Gilmar Bua M 04/29/2015, 10:24 AM

## 2015-04-29 NOTE — Progress Notes (Signed)
[facility]                                                                              Patient Demographics  David Knapp, is a 35 y.o. male, DOB - [birth year], ZOX:[id number]  Admit date - 04/27/2015   Admitting Physician Lorretta Harp, MD  Outpatient Primary MD for the patient is No PCP Per Patient  Outpatient specialists:   LOS - 1  days    Chief Complaint  Patient presents with  . Sore Throat  . Facial Swelling       Brief summary   Briefly 35 year old male with HIV/AIDS, recurrent disseminated histoplasmosis with CNS involvement (s/p of itraconazole for nearly 2 years per Dr. 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 In the ED, creatinine 1.4, UA negative for UTI, protein, patient was positive for rapid strep. He was admitted for further treatment.   Assessment & Plan    Generalized edema/anasarca - Feels a lot better today - Albumin 4.5, even though patient has positive rapid strep throat, UA is negative for any proteinuria or Hgb, not consistent with PSGN. this diagnosis. AST elevated.  - Patient was placed on IV Lasix, however creatinine trended up to 1.7 today, DC'd IV Lasix, if creatinine improving, will restart oral Lasix from tomorrow. - Follow Hepatitis panel, ASO, CH50, C3 and C4  - Follow 2-D echocardiogram results, done today  Strep-A throat: -give one dose of Bicillin IM -f/u Bx  Possible CKD-II: Patient's creatinine was 1.46 on 07/15/14. His creatinine is 1.44 on admission, currently at baseline - follow creatinine function closely with the diuresis  Rashes: -Continue Imiquimod cream  HIV/AIDS: Follow-up by Dr. Algis Liming. CD4 was 210 and VL<20 on 07/01/14. On prezcobix and truvada. Appreciate Dr. Moshe Cipro recommendation, ART restarted, change truvada to descovy  Hx of Disseminated hsitoplasmosis:  s/p of itraconazole for nearly 2 years per Dr. Lattie Haw note.  - Per ID  Hyperlipidemia Cholesterol 309, triglycerides 250, LDL 204 -  will place on statins  Code Status: Full CODE STATUS  Family Communication: Discussed in detail with the patient, all imaging results, lab results explained to the patient    Disposition Plan: Hopefully DC home in a.m. if cleared by infectious disease  Time Spent in minutes   25 minutes  Procedures  None   Consults   ID  DVT Prophylaxis  Lovenox   Medications  Scheduled Meds: . darunavir-cobicistat  1 tablet Oral Daily  . emtricitabine-tenofovir AF  1 tablet Oral Daily  . enoxaparin (LOVENOX) injection  40 mg Subcutaneous Q24H  . sodium chloride flush  3 mL Intravenous Q12H   Continuous Infusions:  PRN Meds:.acetaminophen **OR** acetaminophen, ondansetron **OR** ondansetron (ZOFRAN) IV   Antibiotics   Anti-infectives    Start     Dose/Rate Route Frequency Ordered Stop   04/28/15 1100  emtricitabine-tenofovir AF (DESCOVY) 200-25 MG per tablet 1 tablet     1 tablet Oral Daily 04/28/15 0838     04/28/15 1100  darunavir-cobicistat (PREZCOBIX) 800-150 MG per tablet 1 tablet  1 tablet Oral Daily 04/28/15 0838     04/28/15 0300  penicillin g benzathine (BICILLIN LA) 1200000 UNIT/2ML injection 1.2 Million Units     1.2 Million Units Intramuscular  Once 04/28/15 0249 04/28/15 0313   04/28/15 0130  penicillin g benzathine (BICILLIN LA) 1200000 UNIT/2ML injection 1.2 Million Units  Status:  Discontinued     1.2 Million Units Intramuscular  Once 04/28/15 0121 04/28/15 0240        Subjective:   Harkirat Orozco was seen and examined today.  Feels swelling has significantly improved since yesterday. Otherwise no complaints, no chest pain. Patient denies dizziness, shortness of breath, abdominal pain, N/V/D/C, new weakness, numbess, tingling. No acute events overnight.    Objective:   Filed Vitals:   04/28/15 1214 04/28/15 2142 04/29/15  0051 04/29/15 0443  BP: 94/54  Pulse: 66 66 66 68  Temp:  98.6 F (37 C) 98.2 F (36.8 C) 97.5 F (36.4 C)  TempSrc:  Oral Oral Oral  Resp:  Height:      Weight:    105.96 kg (233 lb 9.6 oz)  SpO2:  90% 90% 92%    Intake/Output Summary (Last 24 hours) at 04/29/15 1131 Last data filed at 04/29/15 0900  Gross per 24 hour  Intake    295 ml  Output   1000 ml  Net   -705 ml     Wt Readings from Last 3 Encounters:  04/29/15 105.96 kg (233 lb 9.6 oz)  04/27/15 110.678 kg (244 lb)  07/15/14 105.235 kg (232 lb)     Exam  General: Alert and oriented x 3, NAD, Anasarca improving  Neck: Supple, no JVD, no masses  CVS: S1 S2 auscultated, no rubs, murmurs or gallops. Regular rate and rhythm.  Respiratory: Clear to auscultation bilaterally, no wheezing, rales or rhonchi  Abdomen: Soft, nontender, nondistended, + bowel sounds  Ext: no cyanosis clubbing or edema  Neuro: AAOx3, Cr N's II- XII. Strength 5/5 upper and lower extremities bilaterally  Skin: No rashes  Psych: Normal affect and demeanor, alert and oriented x3    Data Reviewed:  I have personally reviewed following labs and imaging studies  Micro Results Recent Results (from the past 240 hour(s))  Rapid strep screen     Status: Abnormal   Collection Time: 04/28/15 12:29 AM  Result Value Ref Range Status   Streptococcus, Group A Screen (Direct) POSITIVE (A) NEGATIVE Final    Radiology Reports Dg Chest 2 View  04/28/2015  CLINICAL DATA:  Acute onset of cough and soft tissue edema. Initial encounter. EXAM: CHEST  2 VIEW COMPARISON:  Chest radiograph performed 05/15/2012 FINDINGS: The lungs are hypoexpanded. Mild vascular crowding and vascular congestion are seen. Minimal bibasilar opacities may reflect mild interstitial edema or possibly pneumonia. There is no evidence of pleural effusion or pneumothorax. The cardiomediastinal silhouette is borderline enlarged. No acute osseous  abnormalities are seen. IMPRESSION: Lungs hypoexpanded. Mild vascular congestion and borderline cardiomegaly. Minimal bibasilar opacities may reflect mild interstitial edema or possibly pneumonia. Electronically Signed   By: Roanna Raider M.D.   On: 04/28/2015 01:39    CBC  Recent Labs Lab 04/27/15 1620 04/28/15 0303 04/29/15 0318  WBC 3.4* 3.5* 4.1  HGB 12.3* 12.1* 12.3*  HCT 36.0* 35.3* 36.6*  PLT 172 161 184  MCV 92.5 92.2 93.4  MCH 31.6 31.6 31.4  MCHC 34.2 34.3 33.6  RDW 14.2 14.1 14.3  LYMPHSABS 0.6*  --   --  MONOABS 0.2  --   --   EOSABS 0.2  --   --   BASOSABS 0.0  --   --     Chemistries   Recent Labs Lab 04/27/15 1620 04/28/15 0303 04/29/15 0318  NA 131* 134* 135  K 3.7 3.5 3.6  CL 94* 96* 96*  CO2 27 26 29   GLUCOSE 89 98 94  BUN 8 6 13   CREATININE 1.44* 1.45* 1.75*  CALCIUM 8.9 8.9 9.5  AST 188* 185*  --   ALT 44 44  --   ALKPHOS 48 52  --   BILITOT 1.0 1.3*  --    ------------------------------------------------------------------------------------------------------------------ estimated creatinine clearance is 69.5 mL/min (by C-G formula based on Cr of 1.75). ------------------------------------------------------------------------------------------------------------------  Recent Labs  04/28/15 0303  HGBA1C 6.0*   ------------------------------------------------------------------------------------------------------------------  Recent Labs  04/28/15 0303  CHOL 309*  HDL 55  LDLCALC 204*  TRIG 250*  CHOLHDL 5.6   ------------------------------------------------------------------------------------------------------------------  Recent Labs  04/28/15 0303  TSH >90.000*   ------------------------------------------------------------------------------------------------------------------ No results for input(s): VITAMINB12, FOLATE, FERRITIN, TIBC, IRON, RETICCTPCT in the last 72 hours.  Coagulation profile  Recent Labs Lab  04/28/15 0303  INR 0.96    No results for input(s): DDIMER in the last 72 hours.  Cardiac Enzymes  Recent Labs Lab 04/28/15 0118  TROPONINI <0.03   ------------------------------------------------------------------------------------------------------------------ Invalid input(s): POCBNP  No results for input(s): GLUCAP in the last 72 hours.   Marisha Renier M.D. [facility] 04/29/2015, 11:31 AM  Pager: [phone] Between 7am to 7pm - call Pager - [phone]  After 7pm go to [email or web address] - password [email or web address]  Call night coverage person covering after 7pm

## 2015-04-29 NOTE — Progress Notes (Addendum)
Subjective: No new complaints   Antibiotics:  Anti-infectives    Start     Dose/Rate Route Frequency Ordered Stop   04/28/15 1100  emtricitabine-tenofovir AF (DESCOVY) 200-25 MG per tablet 1 tablet     1 tablet Oral Daily 04/28/15 0838     04/28/15 1100  darunavir-cobicistat (PREZCOBIX) 800-150 MG per tablet 1 tablet     1 tablet Oral Daily 04/28/15 0838     04/28/15 0300  penicillin g benzathine (BICILLIN LA) 1200000 UNIT/2ML injection 1.2 Million Units     1.2 Million Units Intramuscular  Once 04/28/15 0249 04/28/15 0313   04/28/15 0130  penicillin g benzathine (BICILLIN LA) 1200000 UNIT/2ML injection 1.2 Million Units  Status:  Discontinued     1.2 Million Units Intramuscular  Once 04/28/15 0121 04/28/15 0240      Medications: Scheduled Meds: . darunavir-cobicistat  1 tablet Oral Daily  . emtricitabine-tenofovir AF  1 tablet Oral Daily  . enoxaparin (LOVENOX) injection  40 mg Subcutaneous Q24H  . sodium chloride flush  3 mL Intravenous Q12H   Continuous Infusions:  PRN Meds:.acetaminophen **OR** acetaminophen, ondansetron **OR** ondansetron (ZOFRAN) IV    Objective: Weight change: -5 lb (-2.268 kg)  Intake/Output Summary (Last 24 hours) at 04/29/15 1237 Last data filed at 04/29/15 0900  Gross per 24 hour  Intake    120 ml  Output   1000 ml  Net   -880 ml   Blood pressure 88/40, pulse 70, temperature 98.5 F (36.9 C), temperature source Oral, resp. rate 18, height 5' 8.5" (1.74 m), weight 233 lb 9.6 oz (105.96 kg), SpO2 91 %. Temp:  [97.5 F (36.4 C)-98.6 F (37 C)] 98.5 F (36.9 C) (04/27 1233) Pulse Rate:  [66-70] 70 (04/27 1233) Resp:  [16-18] 18 (04/27 1233) BP: (88-95)/(40-57) 88/40 mmHg (04/27 1233) SpO2:  [90 %-92 %] 91 % (04/27 1233) Weight:  [233 lb 9.6 oz (105.96 kg)] 233 lb 9.6 oz (105.96 kg) (04/27 0443)  Physical Exam: General: Alert and awake, oriented x3, not in any acute distress. And feels better HEENT: anicteric sclera, pupils  reactive to light and accommodation, EOMI CVS regular rate, normal r,  no murmur rubs or gallops Chest: clear to auscultation bilaterally, no wheezing, rales or rhonchi Abdomen: soft nontender, nondistended, normal bowel sounds, Extremities: no  clubbing or edema noted bilaterally Skin: chronic changes on hands, his edema in face and arms ane legs is better Neuro: nonfocal  CBC:  CBC Latest Ref Rng 04/29/2015 04/28/2015 04/27/2015  WBC 4.0 - 10.5 K/uL 4.1 3.5(L) 3.4(L)  Hemoglobin 13.0 - 17.0 g/dL 12.3(L) 12.1(L) 12.3(L)  Hematocrit 39.0 - 52.0 % 36.6(L) 35.3(L) 36.0(L)  Platelets 150 - 400 K/uL 184 161 172      BMET  Recent Labs  04/28/15 0303 04/29/15 0318  NA 134* 135  K 3.5 3.6  CL 96* 96*  CO2 26 29  GLUCOSE 98 94  BUN 6 13  CREATININE 1.45* 1.75*  CALCIUM 8.9 9.5     Liver Panel   Recent Labs  04/27/15 1620 04/28/15 0303  PROT 7.8 8.1  ALBUMIN 4.6 4.5  AST 188* 185*  ALT 44 44  ALKPHOS 48 52  BILITOT 1.0 1.3*       Sedimentation Rate No results for input(s): ESRSEDRATE in the last 72 hours. C-Reactive Protein No results for input(s): CRP in the last 72 hours.  Micro Results: Recent Results (from the past 720 hour(s))  Rapid strep screen  Status: Abnormal   Collection Time: 04/28/15 12:29 AM  Result Value Ref Range Status   Streptococcus, Group A Screen (Direct) POSITIVE (A) NEGATIVE Final    Studies/Results: Dg Chest 2 View  04/28/2015  CLINICAL DATA:  Acute onset of cough and soft tissue edema. Initial encounter. EXAM: CHEST  2 VIEW COMPARISON:  Chest radiograph performed 05/15/2012 FINDINGS: The lungs are hypoexpanded. Mild vascular crowding and vascular congestion are seen. Minimal bibasilar opacities may reflect mild interstitial edema or possibly pneumonia. There is no evidence of pleural effusion or pneumothorax. The cardiomediastinal silhouette is borderline enlarged. No acute osseous abnormalities are seen. IMPRESSION: Lungs  hypoexpanded. Mild vascular congestion and borderline cardiomegaly. Minimal bibasilar opacities may reflect mild interstitial edema or possibly pneumonia. Electronically Signed   By: Roanna Raider M.D.   On: 04/28/2015 01:39      Assessment/Plan:  INTERVAL HISTORY:   04/29/15: pt with bump in cr   Principal Problem:   Edema Active Problems:   Human immunodeficiency virus (HIV) disease (HCC)   Disseminated histoplasmosis   AIDS (HCC)   Acute renal failure (HCC)   Post-streptococcal glomerulonephritis   Strep throat   CKD (chronic kidney disease), stage II   Strep pharyngitis    David Knapp is a 35 y.o. male with  HIV/AIDS and prior disseminated histoplasmosis whom I admitted to the hospital from clinic 2 days ago due to concerns for his extensive edema in his face eyelids arms legs with history of an antecedent sore throat and what he thought was a throat infection and concern on my part for possible post streptococcal glomerulonephritis. His kidney function was axis similar to what it was a year ago on admission. He was given a dose of penicillin and diuresed with Lasix. After diuresis with Lasix his edema has improved and he feels much better because of his diffuse edema is still not clear. He has an echocardiogram that was performed the results are pending. Possibility allergic reaction is certainly in the differential. He was not given care to steroids but presumably if he had an allergic reaction is been removed from the precipitant it is odd that this symptoms had been going on for at least 2 weeks if I understand correctly.  His HIV is not well controlled due to problems with him not having filled out his ADAP application and time  #1 Diffuse edema: cause of this is unclear. No new meds. Wonders if it could be due to an allergen at work.Would consider checking C1q, other studies still pending including ASO, and ECHO  Agree with dc IV lasix   #2 Acute on chronic kidney  disease: Re-with changing from IV to oral Lasix and rechecking BMP tomorrow  #3 HIV and AIDS: Viral load is 22,000 consistent with him not being on medications recently have his repeat CD4 count is been placed back on PREZCOBIX but with a more kidney friendly DESCOVY  CM at [facility] have submitted ADAP application as well as [facility]  #4 GAS pharyngitis: sp IM PCN   LOS: 1 day   Acey Lav 04/29/2015, 12:37 PM

## 2015-04-30 ENCOUNTER — Ambulatory Visit (HOSPITAL_COMMUNITY): Payer: Self-pay

## 2015-04-30 DIAGNOSIS — N179 Acute kidney failure, unspecified: Secondary | ICD-10-CM | POA: Insufficient documentation

## 2015-04-30 LAB — BASIC METABOLIC PANEL
Anion gap: 11 (ref 5–15)
BUN: 14 mg/dL (ref 6–20)
CO2: 28 mmol/L (ref 22–32)
Calcium: 9.3 mg/dL (ref 8.9–10.3)
Chloride: 96 mmol/L — ABNORMAL LOW (ref 101–111)
Creatinine, Ser: 1.78 mg/dL — ABNORMAL HIGH (ref 0.61–1.24)
GFR calc Af Amer: 55 mL/min — ABNORMAL LOW (ref >60)
GFR, EST NON AFRICAN AMERICAN: 47 mL/min — AB (ref >60)
GLUCOSE: 91 mg/dL (ref 65–99)
Potassium: 3.4 mmol/L — ABNORMAL LOW (ref 3.5–5.1)
SODIUM: 135 mmol/L (ref 135–145)

## 2015-04-30 LAB — CBC
HEMATOCRIT: 35.6 % — AB (ref 39.0–52.0)
HEMOGLOBIN: 12 g/dL — AB (ref 13.0–17.0)
MCH: 31.7 pg (ref 26.0–34.0)
MCHC: 33.7 g/dL (ref 30.0–36.0)
MCV: 93.9 fL (ref 78.0–100.0)
Platelets: 184 10*3/uL (ref 150–400)
RBC: 3.79 MIL/uL — ABNORMAL LOW (ref 4.22–5.81)
RDW: 14.4 % (ref 11.5–15.5)
WBC: 4 10*3/uL (ref 4.0–10.5)

## 2015-04-30 LAB — C3 AND C4
C3 COMPLEMENT: 108 mg/dL (ref 90–180)
C4 Complement: 17 mg/dL (ref 10–40)

## 2015-04-30 LAB — COMPLEMENT, TOTAL: Compl, Total (CH50): >60 U/mL — ABNORMAL HIGH (ref 31–60)

## 2015-04-30 LAB — SODIUM, URINE, RANDOM: Sodium, Ur: 63 mmol/L

## 2015-04-30 LAB — CREATININE, URINE, RANDOM: CREATININE, URINE: 371.63 mg/dL

## 2015-04-30 MED ORDER — SULFAMETHOXAZOLE-TRIMETHOPRIM 800-160 MG PO TABS: 1.0000 | ORAL_TABLET | ORAL | Status: DC

## 2015-04-30 MED ORDER — DARUNAVIR-COBICISTAT 800-150 MG PO TABS
1.0000 | ORAL_TABLET | Freq: Every day | ORAL | Status: DC
  Administered 2015-04-30 – 2015-05-01 (×2): 1 via ORAL
  Filled 2015-04-30 (×2): qty 1

## 2015-04-30 NOTE — Progress Notes (Signed)
Subjective: He wants to go home to pay bills   Antibiotics:  Anti-infectives    Start     Dose/Rate Route Frequency Ordered Stop   05/03/15 0900  sulfamethoxazole-trimethoprim (BACTRIM DS,SEPTRA DS) 800-160 MG per tablet 1 tablet     1 tablet Oral Once per day on Mon Wed Fri 04/30/15 1531     04/30/15 1015  darunavir-cobicistat (PREZCOBIX) 800-150 MG per tablet 1 tablet     1 tablet Oral Daily with breakfast 04/30/15 1002     04/28/15 1100  emtricitabine-tenofovir AF (DESCOVY) 200-25 MG per tablet 1 tablet     1 tablet Oral Daily 04/28/15 0838     04/28/15 1100  darunavir-cobicistat (PREZCOBIX) 800-150 MG per tablet 1 tablet  Status:  Discontinued     1 tablet Oral Daily 04/28/15 0838 04/30/15 1002   04/28/15 0300  penicillin g benzathine (BICILLIN LA) 1200000 UNIT/2ML injection 1.2 Million Units     1.2 Million Units Intramuscular  Once 04/28/15 0249 04/28/15 0313   04/28/15 0130  penicillin g benzathine (BICILLIN LA) 1200000 UNIT/2ML injection 1.2 Million Units  Status:  Discontinued     1.2 Million Units Intramuscular  Once 04/28/15 0121 04/28/15 0240      Medications: Scheduled Meds: . darunavir-cobicistat  1 tablet Oral Q breakfast  . emtricitabine-tenofovir AF  1 tablet Oral Daily  . enoxaparin (LOVENOX) injection  40 mg Subcutaneous Q24H  . sodium chloride flush  3 mL Intravenous Q12H  . [START ON 05/03/2015] sulfamethoxazole-trimethoprim  1 tablet Oral Once per day on Mon Wed Fri   Continuous Infusions:  PRN Meds:.acetaminophen **OR** acetaminophen, ondansetron **OR** ondansetron (ZOFRAN) IV    Objective: Weight change: -6.4 oz (-0.181 kg)  Intake/Output Summary (Last 24 hours) at 04/30/15 1531 Last data filed at 04/30/15 0913  Gross per 24 hour  Intake    480 ml  Output    200 ml  Net    280 ml   Blood pressure 85/61, pulse 63, temperature 97.7 F (36.5 C), temperature source Oral, resp. rate 18, height 5' 8.5" (1.74 m), weight 233 lb 3.2 oz (105.779  kg), SpO2 93 %. Temp:  [97.7 F (36.5 C)-98.2 F (36.8 C)] 97.7 F (36.5 C) (04/28 1240) Pulse Rate:  [63-72] 63 (04/28 1240) Resp:  [18] 18 (04/28 1240) BP: (85-92)/(40-61) 85/61 mmHg (04/28 1240) SpO2:  [92 %-100 %] 93 % (04/28 1240) Weight:  [233 lb 3.2 oz (105.779 kg)] 233 lb 3.2 oz (105.779 kg) (04/28 0444)  Physical Exam: General: Alert and awake, oriented x3, not in any acute distress. And feels better HEENT: anicteric sclera  EOMI CVS regular rate, normal r,  no murmur rubs or gallops Chest: clear to auscultation bilaterally, no wheezing, rales or rhonchi Abdomen: soft nontender, nondistended, normal bowel sounds, Extremities: no  clubbing or edema noted bilaterally Skin: chronic changes on hands, his edema in face and arms ane legs is better Neuro: nonfocal  CBC:  CBC Latest Ref Rng 04/30/2015 04/29/2015 04/28/2015  WBC 4.0 - 10.5 K/uL 4.0 4.1 3.5(L)  Hemoglobin 13.0 - 17.0 g/dL 12.0(L) 12.3(L) 12.1(L)  Hematocrit 39.0 - 52.0 % 35.6(L) 36.6(L) 35.3(L)  Platelets 150 - 400 K/uL 184 184 161      BMET  Recent Labs  04/29/15 0318 04/30/15 0231  NA 135 135  K 3.6 3.4*  CL 96* 96*  CO2 29 28  GLUCOSE 94 91  BUN 13 14  CREATININE 1.75* 1.78*  CALCIUM 9.5 9.3  Liver Panel   Recent Labs  04/27/15 1620 04/28/15 0303  PROT 7.8 8.1  ALBUMIN 4.6 4.5  AST 188* 185*  ALT 44 44  ALKPHOS 48 52  BILITOT 1.0 1.3*       Sedimentation Rate No results for input(s): ESRSEDRATE in the last 72 hours. C-Reactive Protein No results for input(s): CRP in the last 72 hours.  Micro Results: Recent Results (from the past 720 hour(s))  Rapid strep screen     Status: Abnormal   Collection Time: 04/28/15 12:29 AM  Result Value Ref Range Status   Streptococcus, Group A Screen (Direct) POSITIVE (A) NEGATIVE Final    Studies/Results: No results found.    Assessment/Plan:  INTERVAL HISTORY:   04/29/15: pt with bump in cr "" cr up further  Principal  Problem:   Edema Active Problems:   Human immunodeficiency virus (HIV) disease (HCC)   Disseminated histoplasmosis   AIDS (HCC)   Acute renal failure (HCC)   Post-streptococcal glomerulonephritis   Strep throat   CKD (chronic kidney disease), stage II   Strep pharyngitis    David Knapp is a 34 y.o. male with  HIV/AIDS and prior disseminated histoplasmosis whom I admitted to the hospital from clinic 2 days ago due to concerns for his extensive edema in his face eyelids arms legs with history of an antecedent sore throat and what he thought was a throat infection and concern on my part for possible post streptococcal glomerulonephritis. His kidney function was axis similar to what it was a year ago on admission. He was given a dose of penicillin and diuresed with Lasix. After diuresis with Lasix his edema has improved and he feels much better because of his diffuse edema is still not clear. He has an echocardiogram that was performed the results are pending. Possibility allergic reaction is certainly in the differential. He was not given care to steroids but presumably if he had an allergic reaction is been removed from the precipitant it is odd that this symptoms had been going on for at least 2 weeks if I understand correctly.  His HIV is not well controlled due to problems with him not having filled out his ADAP application and time  #1 Diffuse edema: cause of this is unclear but it is diffuse and he even has a small pericardial effusion. . No new meds. Wonders if it could be due to an allergen at work. Is this some odd reaction to GAS infection. He had strep + and ASO high  Would dc lasix at this point due to bump in cr further and continue supportive management  #2 Acute on chronic kidney disease:  Would recheck BMP tomorrow with holding lasix today  #3 HIV and AIDS: Viral load is 22,000 consistent with him not being on medications recently have his repeat CD4 count is been placed back on  PREZCOBIX but with a more kidney friendly DESCOVY  CM at [facility] have submitted ADAP application as well as [employer]  I would start him on bactrim 3 x per week starting on Monday  I will not start prophylaxis for histo at this point since I am hopeful that this was just a brief lapse in therapy   #4 GAS pharyngitis: sp IM PCN  #5 Disp: if doing well tomorrow can be DC but would give him dose of his ARV for Saturday in house and see if pharmacy can give him meds for Sunday and Monday. If [employer] has been processed meds  should be shippped to clinic and I would expect ready on Tuesday (we are closed on Monday)  Dr. Drue Second is available for questions over the weekend.   LOS: 2 days   Acey Lav 04/30/2015, 3:31 PM

## 2015-04-30 NOTE — Progress Notes (Signed)
[facility]                                                                              Patient Demographics  David Knapp, is a 35 y.o. male, DOB - 10-06-1980, ZOX:[id number]  Admit date - 04/27/2015   Admitting Physician Lorretta Harp, MD  Outpatient Primary MD for the patient is No PCP Per Patient  Outpatient specialists:   LOS - 2  days    Chief Complaint  Patient presents with  . Sore Throat  . Facial Swelling       Brief summary   Briefly 35 year old male with HIV/AIDS, recurrent disseminated histoplasmosis with CNS involvement (s/p of itraconazole for nearly 2 years per Dr. 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 In the ED, creatinine 1.4, UA negative for UTI, protein, patient was positive for rapid strep. He was admitted for further treatment.   Assessment & Plan    Generalized edema/anasarca - Feels a lot better today - Albumin 4.5, even though patient has positive rapid strep throat, UA is negative for any proteinuria or Hgb, not consistent with PSGN. AST elevated.  - Patient was placed on IV Lasix, however creatinineStill holding at 1.7 after Lasix discontinued on 4/27. Echo shows preserved EF, no diastolic dysfunction, patient back to baseline, no Lasix.  - Hepatitis panel negative , ASO elevated 245, complement levels pending - , ASO, CH50, C3 and C4  - 2-D echo showed normal EF  Strep-A throat: -give one dose of Bicillin IM -f/u Bx  Acute on Possible CKD-II: Patient's creatinine was 1.46 on 07/15/14. His creatinine is 1.44 on admission, -Creatinine function likely worsened due to diuresis, Follow renal ultrasound - Urine lites  Rashes: -Continue Imiquimod cream  HIV/AIDS: Follow-up by Dr. Algis Liming. CD4 was 210 and VL<20 on 07/01/14. On prezcobix and truvada. - ID following, on Prezcobix,  descovy  Hx of Disseminated hsitoplasmosis: s/p of itraconazole for nearly 2 years per Dr. Lattie Haw note.  - Per ID  Hyperlipidemia Cholesterol 309, triglycerides 250, LDL 204 -  will place on statins after LFTs have normalized  Code Status: Full CODE STATUS  Family Communication: Discussed in detail with the patient, all imaging results, lab results explained to the patient    Disposition Plan: DC home in a.m. if creatinine improving  Time Spent in minutes   25 minutes  Procedures  None   Consults   ID  DVT Prophylaxis  Lovenox   Medications  Scheduled Meds: . darunavir-cobicistat  1 tablet Oral Q breakfast  . emtricitabine-tenofovir AF  1 tablet Oral Daily  . enoxaparin (LOVENOX) injection  40 mg Subcutaneous Q24H  . sodium chloride flush  3 mL Intravenous Q12H   Continuous Infusions:  PRN Meds:.acetaminophen **OR** acetaminophen, ondansetron **OR** ondansetron (ZOFRAN) IV   Antibiotics   Anti-infectives    Start     Dose/Rate Route Frequency Ordered Stop   04/30/15 1015  darunavir-cobicistat (PREZCOBIX) 800-150 MG per tablet 1 tablet     1 tablet Oral Daily with breakfast 04/30/15 1002  04/28/15 1100  emtricitabine-tenofovir AF (DESCOVY) 200-25 MG per tablet 1 tablet     1 tablet Oral Daily 04/28/15 0838     04/28/15 1100  darunavir-cobicistat (PREZCOBIX) 800-150 MG per tablet 1 tablet  Status:  Discontinued     1 tablet Oral Daily 04/28/15 0838 04/30/15 1002   04/28/15 0300  penicillin g benzathine (BICILLIN LA) 1200000 UNIT/2ML injection 1.2 Million Units     1.2 Million Units Intramuscular  Once 04/28/15 0249 04/28/15 0313   04/28/15 0130  penicillin g benzathine (BICILLIN LA) 1200000 UNIT/2ML injection 1.2 Million Units  Status:  Discontinued     1.2 Million Units Intramuscular  Once 04/28/15 0121 04/28/15 0240        Subjective:   David Knapp was seen and examined today.Swelling is improved, no complaints. Hoping to go home soon. Patient denies  dizziness, shortness of breath, abdominal pain, N/V/D/C, new weakness, numbess, tingling. No acute events overnight.    Objective:   Filed Vitals:   04/29/15 0443 04/29/15 1233 04/29/15 2119 04/30/15 0444  BP: 94/54 88/40 92/56  86/40  Pulse: 68 70 65 72  Temp: 97.5 F (36.4 C) 98.5 F (36.9 C) 98.2 F (36.8 C) 98.2 F (36.8 C)  TempSrc: Oral Oral Oral Oral  Resp: 18 18 18 18   Height:      Weight: 105.96 kg (233 lb 9.6 oz)   105.779 kg (233 lb 3.2 oz)  SpO2: 92% 91% 92% 100%    Intake/Output Summary (Last 24 hours) at 04/30/15 1216 Last data filed at 04/30/15 0913  Gross per 24 hour  Intake    480 ml  Output    200 ml  Net    280 ml     Wt Readings from Last 3 Encounters:  04/30/15 105.779 kg (233 lb 3.2 oz)  04/27/15 110.678 kg (244 lb)  07/15/14 105.235 kg (232 lb)     Exam  General: Alert and oriented x 3, NAD  Neck: Supple, no JVD, no masses  CVS: S1 S2 auscultated, no rubs, murmurs or gallops. Regular rate and rhythm.  Respiratory: Clear to auscultation bilaterally, no wheezing, rales or rhonchi  Abdomen: Soft, nontender, nondistended, + bowel sounds  Ext: no cyanosis clubbing or edema  Neuro:No new deficits  Skin: No rashes  Psych: Normal affect and demeanor, alert and oriented x3    Data Reviewed:  I have personally reviewed following labs and imaging studies  Micro Results Recent Results (from the past 240 hour(s))  Rapid strep screen     Status: Abnormal   Collection Time: 04/28/15 12:29 AM  Result Value Ref Range Status   Streptococcus, Group A Screen (Direct) POSITIVE (A) NEGATIVE Final    Radiology Reports Dg Chest 2 View  04/28/2015  CLINICAL DATA:  Acute onset of cough and soft tissue edema. Initial encounter. EXAM: CHEST  2 VIEW COMPARISON:  Chest radiograph performed 05/15/2012 FINDINGS: The lungs are hypoexpanded. Mild vascular crowding and vascular congestion are seen. Minimal bibasilar opacities may reflect mild interstitial edema  or possibly pneumonia. There is no evidence of pleural effusion or pneumothorax. The cardiomediastinal silhouette is borderline enlarged. No acute osseous abnormalities are seen. IMPRESSION: Lungs hypoexpanded. Mild vascular congestion and borderline cardiomegaly. Minimal bibasilar opacities may reflect mild interstitial edema or possibly pneumonia. Electronically Signed   By: Roanna RaiderJeffery  Chang M.D.   On: 04/28/2015 01:39    CBC  Recent Labs Lab 04/27/15 1620 04/28/15 0303 04/29/15 0318 04/30/15 0231  WBC 3.4* 3.5* 4.1 4.0  HGB 12.3*  12.1* 12.3* 12.0*  HCT 36.0* 35.3* 36.6* 35.6*  PLT 172 161 184 184  MCV 92.5 92.2 93.4 93.9  MCH 31.6 31.6 31.4 31.7  MCHC 34.2 34.3 33.6 33.7  RDW 14.2 14.1 14.3 14.4  LYMPHSABS 0.6*  --   --   --   MONOABS 0.2  --   --   --   EOSABS 0.2  --   --   --   BASOSABS 0.0  --   --   --     Chemistries   Recent Labs Lab 04/27/15 1620 04/28/15 0303 04/29/15 0318 04/30/15 0231  NA 131* 134* 135 135  K 3.7 3.5 3.6 3.4*  CL 94* 96* 96* 96*  CO2 GLUCOSE 89 98 94 91  BUN CREATININE 1.44* 1.45* 1.75* 1.78*  CALCIUM 8.9 8.9 9.5 9.3  AST 188* 185*  --   --   ALT 44 44  --   --   ALKPHOS 48 52  --   --   BILITOT 1.0 1.3*  --   --    ------------------------------------------------------------------------------------------------------------------ estimated creatinine clearance is 68.2 mL/min (by C-G formula based on Cr of 1.78). ------------------------------------------------------------------------------------------------------------------  Recent Labs  04/28/15 0303  HGBA1C 6.0*   ------------------------------------------------------------------------------------------------------------------  Recent Labs  04/28/15 0303  CHOL 309*  HDL 55  LDLCALC 204*  TRIG 250*  CHOLHDL 5.6   ------------------------------------------------------------------------------------------------------------------  Recent Labs   04/28/15 0303  TSH >90.000*   ------------------------------------------------------------------------------------------------------------------ No results for input(s): VITAMINB12, FOLATE, FERRITIN, TIBC, IRON, RETICCTPCT in the last 72 hours.  Coagulation profile  Recent Labs Lab 04/28/15 0303  INR 0.96    No results for input(s): DDIMER in the last 72 hours.  Cardiac Enzymes  Recent Labs Lab 04/28/15 0118  TROPONINI <0.03   ------------------------------------------------------------------------------------------------------------------ Invalid input(s): POCBNP  No results for input(s): GLUCAP in the last 72 hours.   Shayleen Eppinger M.D. [facility] 04/30/2015, 12:16 PM  Pager: [phone] Between 7am to 7pm - call Pager - [phone]  After 7pm go to [email or web address] - password [email or web address]  Call night coverage person covering after 7pm

## 2015-05-01 LAB — COMPREHENSIVE METABOLIC PANEL
ALT: 36 U/L (ref 17–63)
AST: 138 U/L — AB (ref 15–41)
Albumin: 4.4 g/dL (ref 3.5–5.0)
Alkaline Phosphatase: 44 U/L (ref 38–126)
Anion gap: 9 (ref 5–15)
BILIRUBIN TOTAL: 1 mg/dL (ref 0.3–1.2)
BUN: 14 mg/dL (ref 6–20)
CO2: 29 mmol/L (ref 22–32)
CREATININE: 1.77 mg/dL — AB (ref 0.61–1.24)
Calcium: 9.4 mg/dL (ref 8.9–10.3)
Chloride: 98 mmol/L — ABNORMAL LOW (ref 101–111)
GFR calc Af Amer: 55 mL/min — ABNORMAL LOW (ref >60)
GFR, EST NON AFRICAN AMERICAN: 48 mL/min — AB (ref >60)
Glucose, Bld: 92 mg/dL (ref 65–99)
Potassium: 3.6 mmol/L (ref 3.5–5.1)
Sodium: 136 mmol/L (ref 135–145)
TOTAL PROTEIN: 8.3 g/dL — AB (ref 6.5–8.1)

## 2015-05-01 LAB — CBC
HEMATOCRIT: 35.9 % — AB (ref 39.0–52.0)
HEMOGLOBIN: 11.8 g/dL — AB (ref 13.0–17.0)
MCH: 31.6 pg (ref 26.0–34.0)
MCHC: 32.9 g/dL (ref 30.0–36.0)
MCV: 96 fL (ref 78.0–100.0)
Platelets: 194 10*3/uL (ref 150–400)
RBC: 3.74 MIL/uL — AB (ref 4.22–5.81)
RDW: 14.5 % (ref 11.5–15.5)
WBC: 4.5 10*3/uL (ref 4.0–10.5)

## 2015-05-01 MED ORDER — DARUNAVIR-COBICISTAT 800-150 MG PO TABS: 1.0000 | ORAL_TABLET | Freq: Every day | ORAL | Status: DC

## 2015-05-01 MED ORDER — SULFAMETHOXAZOLE-TRIMETHOPRIM 800-160 MG PO TABS: 1.0000 | ORAL_TABLET | ORAL | Status: DC

## 2015-05-01 MED ORDER — EMTRICITABINE-TENOFOVIR AF 200-25 MG PO TABS: 1.0000 | ORAL_TABLET | Freq: Every day | ORAL | Status: DC

## 2015-05-01 MED ORDER — IMIQUIMOD 5 % EX CREA: TOPICAL_CREAM | CUTANEOUS | Status: DC

## 2015-05-01 NOTE — Progress Notes (Signed)
Patient's bp has been low for the past couple of days, patient is asymptomatic; no issues during the night; interpreter line used to communicate with the patient.  DB

## 2015-05-01 NOTE — Discharge Summary (Signed)
Physician Discharge Summary   Patient ID: David Knapp MRN: [id number] DOB/AGE: [birth year] 35 y.o.  Admit date: 04/27/2015 Discharge date: 05/01/2015  Primary Care Physician:  No PCP Per Patient  Discharge Diagnoses:    . Human immunodeficiency virus (HIV) disease (HCC) . Disseminated histoplasmosis . AIDS (HCC) . Acute renal failure (HCC) . 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 Peripheral Edema . Strep throat . CKD (chronic kidney disease), stage II  Consults:  Infectious disease, Dr. Algis Liming  Recommendations for Outpatient Follow-up:  1. Please repeat CBC/BMET at next visit   DIET: Heart healthy diet    Allergies:  No Known Allergies   DISCHARGE MEDICATIONS: Current Discharge Medication List    START taking these medications   Details  sulfamethoxazole-trimethoprim (BACTRIM DS,SEPTRA DS) 800-160 MG tablet Take 1 tablet by mouth 3 (three) times a week. Monday-Wednesday-Friday Qty: 3 tablet, Refills: 0      CONTINUE these medications which have CHANGED   Details  darunavir-cobicistat (PREZCOBIX) 800-150 MG tablet Take 1 tablet by mouth daily with breakfast. Swallow whole. Do NOT crush, break or chew tablets. Take with food. Qty: 3 tablet, Refills: 0    emtricitabine-tenofovir AF (DESCOVY) 200-25 MG tablet Take 1 tablet by mouth daily. Qty: 3 tablet, Refills: 0   Associated Diagnoses: Human immunodeficiency virus (HIV) disease (HCC)    imiquimod (ALDARA) 5 % cream Apply topically 3 (three) times a week. Qty: 12 each, Refills: 2      CONTINUE these medications which have NOT CHANGED   Details  acetaminophen (TYLENOL) 325 MG tablet Take 1 tablet (325 mg total) by mouth every 6 (six) hours as needed.         Brief H and P: For complete details please refer to admission H and P, but in brief Briefly 35 year old male with HIV/AIDS, recurrent disseminated histoplasmosis with CNS involvement (s/p of itraconazole for nearly 2 years per Dr.  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 In the ED, creatinine 1.4, UA negative for UTI, protein, patient was positive for rapid strep. He was admitted for further treatment.  Hospital Course:   Generalized edema/anasarca - Feels a lot better today - Albumin 4.5, even though patient has positive rapid strep throat, UA is negative for any proteinuria or Hgb, not consistent with PSGN. AST elevated.  - Patient was placed on IV Lasix, however creatinine still holding at 1.7 after Lasix discontinued on 4/27. Echo shows preserved EF, no diastolic dysfunction, patient was back to baseline, hence diuresis was discontinued. Peripheral edema was improved. - Hepatitis panel negative , ASO elevated 245, complement levels pending - 2-D echo showed normal EF  Strep-A throat: -give one dose of Bicillin IM -f/u biopsy  Acute on Possible CKD-II: Patient's creatinine was 1.46 on 07/15/14. His creatinine is 1.44 on admission, -Creatinine function likely worsened due to diuresis, renal ultrasound showed no obstructive uropathy or hydronephrosis  Rashes: -Continue Imiquimod cream  HIV/AIDS: Follow-up by Dr. Algis Liming. CD4 was 210 and VL<20 on 07/01/14. On prezcobix and truvada. - ID following, on Prezcobix, descovy, patient was given prescription for 3 days from the pharmacy and will continue further prescriptions from the ID clinic on Tuesday 5/2.  Hx of Disseminated hsitoplasmosis: s/p of itraconazole for nearly 2 years per Dr. Lattie Haw note.  - Per ID  Hyperlipidemia Cholesterol 309, triglycerides 250, LDL 204 - will place on statins after LFTs have normalized   Day of Discharge  BP 82/64 mmHg  Pulse 62  Temp(Src) 97.7 F (36.5 C) (Oral)  Resp 18  Ht 5' 8.5" (1.74 m)  Wt 105.098 kg (231 lb 11.2 oz)  BMI 34.71 kg/m2  SpO2 97%  Physical Exam: General:  Alert and awake oriented x3 not in any acute distress. HEENT: anicteric sclera, pupils reactive to light and accommodation CVS: S1-S2 clear no murmur rubs or gallops Chest: clear to auscultation bilaterally, no wheezing rales or rhonchi Abdomen: soft nontender, nondistended, normal bowel sounds Extremities: no cyanosis, clubbing or edema noted bilaterally Neuro: Cranial nerves II-XII intact, no focal neurological deficits   The results of significant diagnostics from this hospitalization (including imaging, microbiology, ancillary and laboratory) are listed below for reference.    LAB RESULTS: Basic Metabolic Panel:  Recent Labs Lab 04/30/15 0231 05/01/15 0443  NA 135 136  K 3.4* 3.6  CL 96* 98*  CO2 28 29  GLUCOSE 91 92  BUN 14 14  CREATININE 1.78* 1.77*  CALCIUM 9.3 9.4   Liver Function Tests:  Recent Labs Lab 04/28/15 0303 05/01/15 0443  AST 185* 138*  ALT 44 36  ALKPHOS 52 44  BILITOT 1.3* 1.0  PROT 8.1 8.3*  ALBUMIN 4.5 4.4   No results for input(s): LIPASE, AMYLASE in the last 168 hours. No results for input(s): AMMONIA in the last 168 hours. CBC:  Recent Labs Lab 04/27/15 1620  04/30/15 0231 05/01/15 0443  WBC 3.4*  < > 4.0 4.5  NEUTROABS 2.5  --   --   --   HGB 12.3*  < > 12.0* 11.8*  HCT 36.0*  < > 35.6* 35.9*  MCV 92.5  < > 93.9 96.0  PLT 172  < > 184 194  < > = values in this interval not displayed. Cardiac Enzymes:  Recent Labs Lab 04/28/15 0118  TROPONINI <0.03   BNP: Invalid input(s): POCBNP CBG: No results for input(s): GLUCAP in the last 168 hours.  Significant Diagnostic Studies:  Dg Chest 2 View  04/28/2015  CLINICAL DATA:  Acute onset of cough and soft tissue edema. Initial encounter. EXAM: CHEST  2 VIEW COMPARISON:  Chest radiograph performed 05/15/2012 FINDINGS: The lungs are hypoexpanded. Mild vascular crowding and vascular congestion are seen. Minimal bibasilar opacities may reflect mild interstitial edema or possibly  pneumonia. There is no evidence of pleural effusion or pneumothorax. The cardiomediastinal silhouette is borderline enlarged. No acute osseous abnormalities are seen. IMPRESSION: Lungs hypoexpanded. Mild vascular congestion and borderline cardiomegaly. Minimal bibasilar opacities may reflect mild interstitial edema or possibly pneumonia. Electronically Signed   By: Roanna Raider M.D.   On: 04/28/2015 01:39    2D ECHO: Study Conclusions  - Left ventricle: The cavity size was normal. There was  mild-moderate concentric hypertrophy. Systolic function was  vigorous. The estimated ejection fraction was in the range of 65%  to 70%. Wall motion was normal; there were no regional wall  motion abnormalities. Left ventricular diastolic function  parameters were normal. - Aortic valve: Transvalvular velocity was within the normal range.  There was no stenosis. There was no regurgitation. - Mitral valve: There was no regurgitation. - Right ventricle: The cavity size was normal. Wall thickness was  normal. Systolic function was normal. - Tricuspid valve: There was trivial regurgitation. - Inferior vena cava: The vessel was normal in size. The  respirophasic diameter changes were in the normal range (>= 50%),  consistent with normal central venous pressure. - Pericardium, extracardiac: A small pericardial effusion was  identified circumferential  to the heart. Features were not  consistent with tamponade physiology.  Disposition and Follow-up: Discharge Instructions    Diet - low sodium heart healthy    Complete by:  As directed      Increase activity slowly    Complete by:  As directed             DISPOSITION: Home DISCHARGE FOLLOW-UP Follow-up Information    Follow up with Acey Lavornelius Van Dam, MD. Call on 05/04/2015.   Specialty:  Infectious Diseases   Why:  please come to ID clinic or call on Tuesday morning for appt. You need labs checked as well.     Contact information:    [address] [place] [id number] [phone]       Follow up with [facility].   Why:  Go to the clinic Thursday or Friday morning at 08:30am and ask for appointments for insurance, primary care physician, and follow up medical care.   Contact information:   [address] [place] [phone]       Time spent on Discharge: 25mins   Signed:   RAI,RIPUDEEP M.D. [facility] 05/01/2015, 12:15 PM Pager: [phone]

## 2015-05-01 NOTE — Care Management Note (Signed)
Case Management Note  Patient Details  Name: David Knapp MRN: [id number] Date of Birth: 04/30/1980  Subjective/Objective:                  HIV/AIDS, recurrent disseminated histoplasmosis with CNS involvement Action/Plan: Discharge planning Expected Discharge Date:  05/01/15               Expected Discharge Plan:  Home/Self Care  In-House Referral:     Discharge planning Services  CM Consult, Indigent Health Clinic, MATCH Program, Medication Assistance  Post Acute Care Choice:    Choice offered to:  Patient  DME Arranged:    DME Agency:     HH Arranged:    HH Agency:     Status of Service:  Completed, signed off  Medicare Important Message Given:    Date Medicare IM Given:    Medicare IM give by:    Date Additional Medicare IM Given:    Additional Medicare Important Message give by:     If discussed at Long Length of Stay Meetings, dates discussed:    Additional Comments: CM met with pt and gave pt Spanish MATCH letter and English MATCH letter and pt verbalized understanding all parameters of MATCH (pt has used MATCH in the past 2014).  CM gave pt [facility] information for free clinic each Thurs and Fri morning at 08:30 and pt verbalized understanding he can walk in to the clinic at this time and request PCP, follow up care, and insurance aid.  No other CM needs were communicated. Jeffries, Sarah Christine, RN 05/01/2015, 11:09 AM  

## 2015-05-06 ENCOUNTER — Encounter: Payer: Self-pay | Admitting: Infectious Disease

## 2015-05-06 LAB — HIV-1 GENOTYPR PLUS

## 2015-05-07 LAB — COMPLEMENT COMPONENT C1Q: C1q Complement Protein CC1Q: 12.8 mg/dL (ref 11.8–23.8)

## 2015-05-26 ENCOUNTER — Other Ambulatory Visit: Payer: Self-pay | Admitting: Infectious Disease

## 2015-05-26 DIAGNOSIS — B20 Human immunodeficiency virus [HIV] disease: Secondary | ICD-10-CM

## 2015-05-26 MED ORDER — EMTRICITABINE-TENOFOVIR AF 200-25 MG PO TABS: 1.0000 | ORAL_TABLET | Freq: Every day | ORAL | Status: DC

## 2015-05-26 NOTE — Telephone Encounter (Signed)
David Knapp needs a follow-up appointment with David Knapp at his earliest convenience.  He may call [phone] to make this appointment or he may contact David Knapp for assistance.  Needs appointment to renew ADAP/RW

## 2015-08-04 ENCOUNTER — Ambulatory Visit: Payer: Self-pay

## 2015-08-12 ENCOUNTER — Encounter: Payer: Self-pay | Admitting: Infectious Disease

## 2015-09-28 ENCOUNTER — Other Ambulatory Visit: Payer: Self-pay | Admitting: Infectious Disease

## 2015-09-28 DIAGNOSIS — B20 Human immunodeficiency virus [HIV] disease: Secondary | ICD-10-CM

## 2015-11-22 ENCOUNTER — Other Ambulatory Visit (INDEPENDENT_AMBULATORY_CARE_PROVIDER_SITE_OTHER): Payer: Self-pay

## 2015-11-22 DIAGNOSIS — Z79899 Other long term (current) drug therapy: Secondary | ICD-10-CM

## 2015-11-22 DIAGNOSIS — B20 Human immunodeficiency virus [HIV] disease: Secondary | ICD-10-CM

## 2015-11-22 DIAGNOSIS — Z113 Encounter for screening for infections with a predominantly sexual mode of transmission: Secondary | ICD-10-CM

## 2015-11-22 LAB — CBC WITH DIFFERENTIAL/PLATELET
Basophils Absolute: 39 cells/uL (ref 0–200)
Basophils Relative: 1 %
EOS PCT: 6 %
Eosinophils Absolute: 234 cells/uL (ref 15–500)
HEMATOCRIT: 37.3 % — AB (ref 38.5–50.0)
Hemoglobin: 12.5 g/dL — ABNORMAL LOW (ref 13.2–17.1)
LYMPHS PCT: 28 %
Lymphs Abs: 1092 cells/uL (ref 850–3900)
MCH: 31.9 pg (ref 27.0–33.0)
MCHC: 33.5 g/dL (ref 32.0–36.0)
MCV: 95.2 fL (ref 80.0–100.0)
MONO ABS: 195 {cells}/uL — AB (ref 200–950)
MPV: 9.2 fL (ref 7.5–12.5)
Monocytes Relative: 5 %
NEUTROS PCT: 60 %
Neutro Abs: 2340 cells/uL (ref 1500–7800)
Platelets: 165 10*3/uL (ref 140–400)
RBC: 3.92 MIL/uL — AB (ref 4.20–5.80)
RDW: 16.2 % — AB (ref 11.0–15.0)
WBC: 3.9 10*3/uL (ref 3.8–10.8)

## 2015-11-22 LAB — COMPLETE METABOLIC PANEL WITH GFR
ALK PHOS: 37 U/L — AB (ref 40–115)
ALT: 30 U/L (ref 9–46)
AST: 106 U/L — AB (ref 10–40)
Albumin: 4.8 g/dL (ref 3.6–5.1)
BILIRUBIN TOTAL: 0.7 mg/dL (ref 0.2–1.2)
BUN: 18 mg/dL (ref 7–25)
CO2: 30 mmol/L (ref 20–31)
CREATININE: 1.62 mg/dL — AB (ref 0.60–1.35)
Calcium: 9.5 mg/dL (ref 8.6–10.3)
Chloride: 99 mmol/L (ref 98–110)
GFR, Est African American: 62 mL/min (ref >=60)
GFR, Est Non African American: 54 mL/min — ABNORMAL LOW (ref >=60)
Glucose, Bld: 98 mg/dL (ref 65–99)
Potassium: 3.9 mmol/L (ref 3.5–5.3)
Sodium: 135 mmol/L (ref 135–146)
TOTAL PROTEIN: 8.1 g/dL (ref 6.1–8.1)

## 2015-11-22 LAB — LIPID PANEL
CHOLESTEROL: 313 mg/dL — AB (ref <200)
HDL: 48 mg/dL (ref >40)
LDL Cholesterol: 212 mg/dL — ABNORMAL HIGH (ref <100)
TRIGLYCERIDES: 264 mg/dL — AB (ref <150)
Total CHOL/HDL Ratio: 6.5 Ratio — ABNORMAL HIGH (ref <5.0)
VLDL: 53 mg/dL — ABNORMAL HIGH (ref <30)

## 2015-11-23 LAB — T-HELPER CELL (CD4) - (RCID CLINIC ONLY)
CD4 T CELL ABS: 200 /uL — AB (ref 400–2700)
CD4 T CELL HELPER: 18 % — AB (ref 33–55)

## 2015-11-23 LAB — RPR

## 2015-11-26 LAB — HIV-1 RNA ULTRAQUANT REFLEX TO GENTYP+: HIV 1 RNA Quant: <20 copies/mL (ref <20)

## 2015-12-06 ENCOUNTER — Ambulatory Visit: Payer: Self-pay | Admitting: Infectious Disease

## 2016-02-10 ENCOUNTER — Ambulatory Visit: Payer: Self-pay

## 2016-02-24 ENCOUNTER — Encounter: Payer: Self-pay | Admitting: Infectious Disease

## 2016-03-22 ENCOUNTER — Ambulatory Visit (INDEPENDENT_AMBULATORY_CARE_PROVIDER_SITE_OTHER): Payer: Self-pay | Admitting: Infectious Disease

## 2016-03-22 ENCOUNTER — Encounter: Payer: Self-pay | Admitting: Infectious Disease

## 2016-03-22 VITALS — BP 100/66 | HR 68 | Temp 98.6°F | Wt 239.1 lb

## 2016-03-22 DIAGNOSIS — R221 Localized swelling, mass and lump, neck: Secondary | ICD-10-CM

## 2016-03-22 DIAGNOSIS — R601 Generalized edema: Secondary | ICD-10-CM

## 2016-03-22 DIAGNOSIS — Z23 Encounter for immunization: Secondary | ICD-10-CM

## 2016-03-22 DIAGNOSIS — T7840XD Allergy, unspecified, subsequent encounter: Secondary | ICD-10-CM

## 2016-03-22 DIAGNOSIS — T7840XA Allergy, unspecified, initial encounter: Secondary | ICD-10-CM | POA: Insufficient documentation

## 2016-03-22 DIAGNOSIS — R499 Unspecified voice and resonance disorder: Secondary | ICD-10-CM

## 2016-03-22 DIAGNOSIS — B20 Human immunodeficiency virus [HIV] disease: Secondary | ICD-10-CM

## 2016-03-22 DIAGNOSIS — Z113 Encounter for screening for infections with a predominantly sexual mode of transmission: Secondary | ICD-10-CM

## 2016-03-22 DIAGNOSIS — B399 Histoplasmosis, unspecified: Secondary | ICD-10-CM

## 2016-03-22 HISTORY — DX: Localized swelling, mass and lump, neck: R22.1

## 2016-03-22 HISTORY — DX: Unspecified voice and resonance disorder: R49.9

## 2016-03-22 HISTORY — DX: Allergy, unspecified, initial encounter: T78.40XA

## 2016-03-22 LAB — COMPLETE METABOLIC PANEL WITH GFR
ALBUMIN: 4.4 g/dL (ref 3.6–5.1)
ALK PHOS: 47 U/L (ref 40–115)
ALT: 23 U/L (ref 9–46)
AST: 88 U/L — AB (ref 10–40)
BILIRUBIN TOTAL: 0.5 mg/dL (ref 0.2–1.2)
BUN: 17 mg/dL (ref 7–25)
CO2: 32 mmol/L — AB (ref 20–31)
Calcium: 9.1 mg/dL (ref 8.6–10.3)
Chloride: 101 mmol/L (ref 98–110)
Creat: 1.54 mg/dL — ABNORMAL HIGH (ref 0.60–1.35)
GFR, Est African American: 66 mL/min (ref >=60)
GFR, Est Non African American: 57 mL/min — ABNORMAL LOW (ref >=60)
GLUCOSE: 99 mg/dL (ref 65–99)
POTASSIUM: 3.7 mmol/L (ref 3.5–5.3)
Sodium: 139 mmol/L (ref 135–146)
TOTAL PROTEIN: 7.5 g/dL (ref 6.1–8.1)

## 2016-03-22 MED ORDER — BICTEGRAVIR-EMTRICITAB-TENOFOV 50-200-25 MG PO TABS: 1.0000 | ORAL_TABLET | Freq: Every day | ORAL | 5 refills | Status: DC

## 2016-03-22 NOTE — Patient Instructions (Signed)
David Knapp needs appt with Minh in next 2 weeks after switching from   Prezcobix and Descovy to [facility]

## 2016-03-22 NOTE — Progress Notes (Signed)
HPI: David Knapp is a 36 y.o. male who is here for his HIV visit.   Allergies: No Known Allergies  Vitals: Temp: 98.6 F (37 C) (03/21 1532) BP: 100/66 (03/21 1532) Pulse Rate: 68 (03/21 1532)  Past Medical History: Past Medical History:  Diagnosis Date  . CKD (chronic kidney disease), stage II   . 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 Possible recurrence 2014 (currently being evaluated 05/2012)  . Edema 04/27/2015  . HIV (human immunodeficiency virus infection) (HCC)   . Pancytopenia (HCC)   . 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 Marital status: Single    Spouse name: N/A  . Number of children: 0  . 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 Smoking status: Never Smoker  . Smokeless tobacco: Never Used  . Alcohol use No  . Drug use: No  . Sexual activity: Yes    Partners: Female     Comment: given condoms   Other Topics Concern  . None   Social History Narrative   [place] in [place] with his wife and 3 nephews/nieces. Is spanish speaking only - requiring interpretor. He is able to read and write in Spanish only    Previous Regimen: ATV/r/TRV, ATP  Current Regimen: Prezcobix/Descovy  Labs: HIV 1 RNA Quant (copies/mL)  Date Value  11/22/2015 <20  04/27/2015 22,290 (H)  07/01/2014 <20   CD4 T Cell Abs (/uL)  Date Value  11/22/2015 200 (L)  04/27/2015 120 (L)  07/01/2014 210 (L)   Hep B S Ab (no units)  Date Value  09/18/2006 NEG   Hepatitis B Surface Ag (no units)  Date Value  04/28/2015 Negative   HCV Ab (s/co ratio)  Date Value  04/28/2015 <0.1    CrCl: CrCl cannot be calculated (Patient's most recent lab result is older than the maximum 21 days allowed.).  Lipids:    Component Value Date/Time   CHOL 313 (H) 11/22/2015 1417   TRIG 264 (H) 11/22/2015 1417   HDL 48  11/22/2015 1417   CHOLHDL 6.5 (H) 11/22/2015 1417   VLDL 53 (H) 11/22/2015 1417   LDLCALC 212 (H) 11/22/2015 1417    Assessment: David Knapp is here for his rountine HIV visit. He has been doing well on his the boosted PI regimen. I dig back to see if I can find any old genotype. 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 There was mention of one in 01/25/2007 but I could not find in the computer. Dr. Daiva EvesVan Dam plans to change him to [facility]. We both believe that he may have a sulfa allergy? He had some throat swelling that past? Would not make sense if he has no issue now on Prezcobix. He'll pick up Biktarvy today and start it today.   Recommendations:  Stop Prezcobix/Descovy Start Biktarvy 1 PO qday F/u with me in 1 month for labs  Ulyses SouthwardMinh Nena Hampe, PharmD, BCPS, AAHIVP, CPP Clinical Infectious Disease Pharmacist [facility] 03/22/2016, 4:10 PM

## 2016-03-22 NOTE — Progress Notes (Signed)
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    When I last saw him in clinic in April of 2017 he had complaints of sore throat in past 3 weeks that he thought was due to an infection. He then began developing diffuse swelling of his arms, legs, face, eyelids and neck.  He has not taken any new medications or been exposed to any allergens.   He was UNUSUALLY edematous throughout entire body. I was  concerned that this may be a poststreptococcal glomerulonephritis with renal failure vs other reaction. I had him admitted the hospital directly. [facility] his renal function was completely normal. He did have elevated ASO titers he was given penicillin for that and he was diuresed with Lasix. He was then discharged and I had not seen him since that hospital discharge. Allergic reaction was still thought to be possible at that time though he was not given corticosteroids to treat a severe allergic reaction. His home medications were stopped briefly be then resumed. His labs from April were Axid disconcerting with a viral load in the 20,000 range. Genotype showed only the K103N that has been seen previously. He has renewed his ADAP and had labs in November which showed him to have a viral load that was undetectable again and his CD4 count that it risen to 200. He had not seen me in the interim however and now returns to care. He has had his ADAP renewed having worked with [employer].  He tells me today when translated by Kelby Fam that he is having sensation of his throat swelling in his neck swelling when he takes his PREZCOBIX and DESCOVY. He  believes these medications are causing this because on some days he is tried to see what would happen if he did not take his medications and he has not noticed the swelling in his neck and throat.  He has also stopped his Bactrim altogether.   Past Medical History:  Diagnosis Date  . CKD (chronic kidney disease), stage II   . 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 Possible recurrence 2014 (currently being evaluated 05/2012)  . Edema 04/27/2015  . HIV (human immunodeficiency virus infection) (HCC)   . Pancytopenia (HCC)   . 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 NO PAST SURGERIES      Family History  Problem Relation Age of Onset  . Coronary artery disease Mother     died from MI @ age 39 yo      Social History   Social History  . Marital status: Single    Spouse name: N/A  . Number of children: 0  . 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 Smoking status: Never Smoker  . Smokeless tobacco: Never Used  . Alcohol use No  . Drug use: No  . Sexual activity: Yes    Partners: Female     Comment: given condoms   Other Topics Concern  . None   Social History Narrative   [place]ives  in [place] with his wife and 3 nephews/nieces. Is spanish speaking only - requiring interpretor. He is able to read and write in Spanish only    No Known Allergies   Current Outpatient Prescriptions:  .  acetaminophen (TYLENOL) 325 MG tablet, Take 1 tablet (325 mg total) by mouth every 6 (six) hours as needed. (Patient not taking: Reported on 04/27/2015), Disp: , Rfl:  .  DESCOVY 200-25 MG tablet, TAKE 1 TABLET BY MOUTH DAILY, Disp: 30 tablet, Rfl: 2 .  imiquimod (ALDARA) 5 % cream, Apply topically 3 (three) times a week., Disp: 12 each, Rfl: 2 .  PREZCOBIX 800-150 MG tablet, TAKE 1 TABLET BY MOUTH EVERY DAY, Disp: 30 tablet, Rfl: 2 .   sulfamethoxazole-trimethoprim (BACTRIM DS,SEPTRA DS) 800-160 MG tablet, Take 1 tablet by mouth 3 (three) times a week. Monday-Wednesday-Friday, Disp: 3 tablet, Rfl: 0   Review of Systems  Constitutional: Negative for appetite change, chills, diaphoresis and fever.  HENT: Positive for sore throat and voice change. Negative for congestion, rhinorrhea, sinus pressure, sneezing and trouble swallowing.   Eyes: Negative for photophobia and visual disturbance.  Respiratory: Negative for chest tightness, shortness of breath, wheezing and stridor.   Cardiovascular: Positive for leg swelling. Negative for chest pain and palpitations.  Gastrointestinal: Negative for abdominal distention, abdominal pain, anal bleeding, blood in stool, constipation, diarrhea, nausea and vomiting.  Genitourinary: Negative for difficulty urinating, dysuria, flank pain and hematuria.  Musculoskeletal: Negative for arthralgias, back pain, gait problem, joint swelling and myalgias.  Skin: Negative for pallor and wound.  Neurological: Negative for dizziness, tremors, weakness and light-headedness.  Hematological: Negative for adenopathy. Does not bruise/bleed easily.  Psychiatric/Behavioral: Negative for agitation, behavioral problems, confusion, decreased concentration, dysphoric mood and sleep disturbance.     Physical Exam  Constitutional: He is oriented to person, place, and time. He appears well-nourished. No distress.  HENT:  Head: Normocephalic and atraumatic.  Mouth/Throat: Oropharynx is clear and moist. No oropharyngeal exudate.    Eyes: Conjunctivae and EOM are normal. No scleral icterus.  Neck: Normal range of motion. Neck supple.  Cardiovascular: Normal rate, regular rhythm and normal heart sounds.  Exam reveals no gallop and no friction rub.   No murmur heard. Pulmonary/Chest: Effort normal and breath sounds normal. No respiratory distress. He has no wheezes. He has no rales. He exhibits no tenderness.    Abdominal: He exhibits no distension and no mass. There is no tenderness. There is no rebound and no guarding.  Musculoskeletal: He exhibits edema. He exhibits no tenderness.  Neurological: He is alert and oriented to person, place, and time. He exhibits normal muscle tone. Coordination normal.  Skin: Skin is warm and dry. Rash noted. He is not diaphoretic. No erythema. No pallor.     Psychiatric: He has a normal mood and affect. His behavior is normal. Judgment and thought content normal.    I did not take pictures today but his overall appearance was similar to what is described below in photographic form in terms of edema the left edge of his lips seemed a bit swollen to be stated that this is because he had accidentally hit his mouth against an object which would cause the swelling.  Facial edema 04/27/15:      Skin: Still with pedunculated lesions on his fingers several visits ago    07/15/14:      edema of arms, legs, hands, face  04/27/15:       Assessment/Plan:   #1 ? Allergic reaction to his  ARV. He believes that his medications are causing him to have swelling of his throat and deepening of his voice. Prezcobix does contain a sole from OT and perhaps this is driving his symptoms of perception that his throat and neck are swelling. We will change him from PREZCOBIX and DESCOVY to [facility], we will not restart bactrim right now  #2 HIV: We will recheck a viral load with reflex to genotype CD4 count and basic labs  #3 Disseminated hsitoplasmosis: hopefully cured but risk for relapse if off meds for sufficient amount of time  #4 Edema: I do wonder if he has had an adverse reaction to his medications and is rather bizarre that he would have it for this protracted period of time without progressively worsening. Is been nearly a year since we saw him last.  We  spent greater than 40 minutes with the patient including greater than 50% of time in face to face counsel of  the patient re his whole body edema, throat pain possible allergic reaction, HIV, histoplasmosis and in coordination of his care.

## 2016-03-23 LAB — CBC WITH DIFFERENTIAL/PLATELET
BASOS ABS: 45 {cells}/uL (ref 0–200)
BASOS PCT: 1 %
EOS ABS: 270 {cells}/uL (ref 15–500)
EOS PCT: 6 %
HCT: 34.3 % — ABNORMAL LOW (ref 38.5–50.0)
HEMOGLOBIN: 11.3 g/dL — AB (ref 13.2–17.1)
Lymphocytes Relative: 27 %
Lymphs Abs: 1215 cells/uL (ref 850–3900)
MCH: 32.2 pg (ref 27.0–33.0)
MCHC: 32.9 g/dL (ref 32.0–36.0)
MCV: 97.7 fL (ref 80.0–100.0)
MPV: 8.7 fL (ref 7.5–12.5)
Monocytes Absolute: 180 cells/uL — ABNORMAL LOW (ref 200–950)
Monocytes Relative: 4 %
NEUTROS ABS: 2790 {cells}/uL (ref 1500–7800)
Neutrophils Relative %: 62 %
PLATELETS: 215 10*3/uL (ref 140–400)
RBC: 3.51 MIL/uL — AB (ref 4.20–5.80)
RDW: 15.8 % — ABNORMAL HIGH (ref 11.0–15.0)
WBC: 4.5 10*3/uL (ref 3.8–10.8)

## 2016-03-23 LAB — T-HELPER CELL (CD4) - (RCID CLINIC ONLY)
CD4 T CELL ABS: 240 /uL — AB (ref 400–2700)
CD4 T CELL HELPER: 21 % — AB (ref 33–55)

## 2016-03-23 LAB — RPR

## 2016-03-24 LAB — HIV-1 RNA,QN PCR W/REFLEX GENOTYPE
HIV-1 RNA, QN PCR: <1.3 Log cps/mL
HIV-1 RNA, QN PCR: <20 Copies/mL

## 2016-04-19 ENCOUNTER — Ambulatory Visit (INDEPENDENT_AMBULATORY_CARE_PROVIDER_SITE_OTHER): Payer: Self-pay | Admitting: Pharmacist

## 2016-04-19 DIAGNOSIS — B20 Human immunodeficiency virus [HIV] disease: Secondary | ICD-10-CM

## 2016-04-19 NOTE — Progress Notes (Signed)
HPI: David Knapp is a 36 y.o. male who presents to the [facility] pharmacy clinic today for HIV follow-up. He recently switched to [employer].  Allergies: Allergies  Allergen Reactions  . Sulfa Antibiotics Other (See Comments)    He believes he has experienced throat swelling with prezcobix and Descovy for nearly a year since April 2017 to present March 22, 2016    Past Medical History: Past Medical History:  Diagnosis Date  . Allergic reaction 03/22/2016  . Change in voice 03/22/2016  . CKD (chronic kidney disease), stage II   . 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 Possible recurrence 2014 (currently being evaluated 05/2012)  . Edema 04/27/2015  . HIV (human immunodeficiency virus infection) (HCC)   . Pancytopenia (HCC)   . 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 Throat swelling 03/22/2016    Social History: Social History   Social History  . Marital status: Single    Spouse name: N/A  . Number of children: 0  . 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 Smoking status: Never Smoker  . Smokeless tobacco: Never Used  . Alcohol use No  . Drug use: No  . Sexual activity: Yes    Partners: Female     Comment: given condoms   Other Topics Concern  . Not on file   Social History Narrative   Lives in [place] with his wife and 3 nephews/nieces. Is spanish speaking only - requiring interpretor. He is able to read and write in Spanish only    Current Regimen: Biktarvy  Labs: HIV 1 RNA Quant (copies/mL)  Date Value  11/22/2015 <20  04/27/2015 22,290 (H)  07/01/2014 <20   CD4 T Cell Abs (/uL)  Date Value  03/22/2016 240 (L)  11/22/2015 200 (L)  04/27/2015 120 (L)   Hep B S Ab (no units)  Date Value  09/18/2006 NEG   Hepatitis B Surface Ag (no units)  Date Value  04/28/2015 Negative   HCV Ab (s/co ratio)  Date Value  04/28/2015 <0.1    CrCl: CrCl cannot  be calculated (Patient's most recent lab result is older than the maximum 21 days allowed.).  Lipids:    Component Value Date/Time   CHOL 313 (H) 11/22/2015 1417   TRIG 264 (H) 11/22/2015 1417   HDL 48 11/22/2015 1417   CHOLHDL 6.5 (H) 11/22/2015 1417   VLDL 53 (H) 11/22/2015 1417   LDLCALC 212 (H) 11/22/2015 1417    Assessment: David Knapp is here today for HIV follow-up.  He was previously on Prezcobix + Descovy and switched to [facility] for simplicity and due to thoughts of having an allergic reaction to the Prezcobix.  He tells me today he is doing quite well on the [place].  He says it is much easier to swallow than the Prezcobix. He states he hasn't missed a dose in the last month.  He also said he usually takes it at 8 or 9 am but sometimes takes it in the evening at 6 or 7 pm.  Counseled him on the importance of taking it at the same time every day. He states he will start trying to take it every morning from now on.  He is not having any side effects - no nausea, no diarrhea, no vomiting, etc. We will get labs again today.  He sees Dr. Daiva Eves again in July.   Plans: - Continue Biktarvy 1 pill PO daily -  HIV VL today - F/u with Dr. Daiva Eves 7/2 at 2:15pm  David Knapp, PharmD, CPP Infectious Diseases Clinical Pharmacist [facility] 04/19/2016, 10:46 AM

## 2016-04-21 LAB — HIV-1 RNA QUANT-NO REFLEX-BLD
HIV 1 RNA Quant: <20 copies/mL
HIV-1 RNA QUANT, LOG: NOT DETECTED {Log_copies}/mL

## 2016-07-03 ENCOUNTER — Ambulatory Visit: Payer: Self-pay | Admitting: Infectious Disease

## 2016-09-25 ENCOUNTER — Other Ambulatory Visit: Payer: Self-pay

## 2016-09-25 ENCOUNTER — Ambulatory Visit (INDEPENDENT_AMBULATORY_CARE_PROVIDER_SITE_OTHER): Payer: Self-pay | Admitting: Infectious Disease

## 2016-09-25 VITALS — BP 116/73 | HR 69 | Temp 98.5°F | Wt 248.0 lb

## 2016-09-25 DIAGNOSIS — Z23 Encounter for immunization: Secondary | ICD-10-CM

## 2016-09-25 DIAGNOSIS — Z79899 Other long term (current) drug therapy: Secondary | ICD-10-CM

## 2016-09-25 DIAGNOSIS — D61818 Other pancytopenia: Secondary | ICD-10-CM

## 2016-09-25 DIAGNOSIS — B20 Human immunodeficiency virus [HIV] disease: Secondary | ICD-10-CM

## 2016-09-25 DIAGNOSIS — Z113 Encounter for screening for infections with a predominantly sexual mode of transmission: Secondary | ICD-10-CM

## 2016-09-25 DIAGNOSIS — B399 Histoplasmosis, unspecified: Secondary | ICD-10-CM

## 2016-09-25 MED ORDER — BICTEGRAVIR-EMTRICITAB-TENOFOV 50-200-25 MG PO TABS
1.0000 | ORAL_TABLET | Freq: Every day | ORAL | 11 refills | Status: DC
Start: 2016-09-25 — End: 2016-09-25

## 2016-09-25 MED ORDER — BICTEGRAVIR-EMTRICITAB-TENOFOV 50-200-25 MG PO TABS: 1.0000 | ORAL_TABLET | Freq: Every day | ORAL | 11 refills | Status: DC

## 2016-09-25 MED ORDER — BICTEGRAVIR-EMTRICITAB-TENOFOV 50-200-25 MG PO TABS: 1.0000 | ORAL_TABLET | Freq: Every day | ORAL | 3 refills | Status: DC

## 2016-09-25 MED FILL — BIKTARVY 50-200-25 MG TABS: 50-200-25 | 30 days supply | Qty: 30 | Fill #0

## 2016-09-25 NOTE — Addendum Note (Signed)
Addended by: Aggie Cosier L on: 09/25/2016 01:06 PM   Modules accepted: Orders

## 2016-09-25 NOTE — Progress Notes (Signed)
HPI: David Knapp is a 36 y.o. male who presents to the [facility] clinic today to follow-up with Dr. Daiva Eves for his HIV infection.  Allergies: Allergies  Allergen Reactions  . Sulfa Antibiotics Other (See Comments)    He believes he has experienced throat swelling with prezcobix and Descovy for nearly a year since April 2017 to present March 22, 2016    Past Medical History: Past Medical History:  Diagnosis Date  . Allergic reaction 03/22/2016  . Change in voice 03/22/2016  . CKD (chronic kidney disease), stage II   . 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 Possible recurrence 2014 (currently being evaluated 05/2012)  . Edema 04/27/2015  . HIV (human immunodeficiency virus infection) (HCC)   . Pancytopenia (HCC)   . 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 Throat swelling 03/22/2016    Social History: Social History   Social History  . Marital status: Single    Spouse name: N/A  . Number of children: 0  . 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 Smoking status: Never Smoker  . Smokeless tobacco: Never Used  . Alcohol use No  . Drug use: No  . Sexual activity: Yes    Partners: Female     Comment: given condoms   Other Topics Concern  . Not on file   Social History Narrative   Lives in [place] with his wife and 3 nephews/nieces. Is spanish speaking only - requiring interpretor. He is able to read and write in Spanish only    Current Regimen: Biktarvy  Labs: HIV 1 RNA Quant (copies/mL)  Date Value  04/19/2016 <20 NOT DETECTED  11/22/2015 <20  04/27/2015 22,290 (H)   CD4 T Cell Abs (/uL)  Date Value  03/22/2016 240 (L)  11/22/2015 200 (L)  04/27/2015 120 (L)   Hep B S Ab (no units)  Date Value  09/18/2006 NEG   Hepatitis B Surface Ag (no units)  Date Value  04/28/2015 Negative   HCV Ab (s/co ratio)  Date Value  04/28/2015 <0.1    CrCl: CrCl  cannot be calculated (Patient's most recent lab result is older than the maximum 21 days allowed.).  Lipids:    Component Value Date/Time   CHOL 313 (H) 11/22/2015 1417   TRIG 264 (H) 11/22/2015 1417   HDL 48 11/22/2015 1417   CHOLHDL 6.5 (H) 11/22/2015 1417   VLDL 53 (H) 11/22/2015 1417   LDLCALC 212 (H) 11/22/2015 1417    Assessment: Panayiotis is here today to follow-up for his HIV infection.  He is doing well on Biktarvy - no missed doses and no issues taking it.  He has neglected to renew his ADAP, so Morrie Sheldon will help him get a 30 day advancing access through [place].  I will see him back in 3-4 weeks to make sure he doesn't have a lapse in care.   Plans: - Continue Biktarvy PO once daily - F/u with me 10/17 at 1030am  Cassie L. Kuppelweiser, PharmD, CPP Infectious Diseases Clinical Pharmacist [facility] 09/25/2016, 11:41 AM

## 2016-09-25 NOTE — Telephone Encounter (Signed)
Script needed for patient assistance.

## 2016-09-25 NOTE — Progress Notes (Signed)
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   He had been previously treated with PREZCOBIX and DESCOVY with nice virological suppression recently changed to [facility] with continued suppression. He is late and renewing his ADAP and we will need to bridge him with the advancing access program via filling one month's drugs at [facility].  He met with myself Francina Ames, Juliann Pulse, and to ensure all of these processes put into place.  Lab Results  Component Value Date   HIV1RNAQUANT <20 NOT DETECTED 04/19/2016   HIV1RNAQUANT <20 11/22/2015   HIV1RNAQUANT 22,290 (H) 04/27/2015   Lab Results  Component Value Date   CD4TABS 240 (L) 03/22/2016   CD4TABS 200 (L) 11/22/2015   CD4TABS 120 (L) 04/27/2015      Past Medical History:  Diagnosis Date  . Allergic reaction 03/22/2016  . Change in voice 03/22/2016  . CKD (chronic kidney disease), stage II   . 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 Possible recurrence 2014 (currently being evaluated 05/2012)  . Edema 04/27/2015  . HIV (human immunodeficiency virus infection) ([place])   . Pancytopenia ([place])   . 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 Throat swelling 03/22/2016    Past Surgical History:  Procedure Laterality Date  . NO PAST SURGERIES      Family History  Problem Relation Age of Onset  . Coronary artery disease Mother        died from MI @ age 31 yo      Social History   Social History  . Marital status: Single    Spouse name: N/A  . Number of children: 0  . 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 Smoking status: Never Smoker  . Smokeless  tobacco: Never Used  . Alcohol use No  . Drug use: No  . Sexual activity: Yes    Partners: Female     Comment: given condoms   Other Topics Concern  . Not on file   Social History Narrative   Lives in [place] with his wife and 3 nephews/nieces. Is spanish speaking only - requiring interpretor. He is able to read and write in Spanish only    Allergies  Allergen Reactions  . Sulfa Antibiotics Other (See Comments)    He believes he has experienced throat swelling with prezcobix and Descovy for nearly a year since April 2017 to present March 22, 2016     Current Outpatient Prescriptions:  .  acetaminophen (TYLENOL) 325 MG tablet, Take 1 tablet (325 mg total) by mouth every 6 (six) hours as needed., Disp: , Rfl:  .  bictegravir-emtricitabine-tenofovir AF (BIKTARVY) 50-200-25 MG TABS tablet, Take 1 tablet by mouth daily., Disp: 30 tablet, Rfl: 11 .  imiquimod (ALDARA) 5 % cream, Apply topically 3 (three) times a week. (Patient not taking: Reported on 09/25/2016), Disp: 12 each, Rfl: 2   Review of Systems  Constitutional: Negative for appetite change, chills, diaphoresis and fever.  HENT: Negative for congestion, rhinorrhea, sinus pressure, sneezing, sore throat, trouble swallowing and voice change.   Eyes: Negative for photophobia and visual disturbance.  Respiratory: Negative for chest tightness, shortness of breath, wheezing and stridor.  Cardiovascular: Negative for chest pain and palpitations.  Gastrointestinal: Negative for abdominal distention, abdominal pain, anal bleeding, blood in stool, constipation, diarrhea, nausea and vomiting.  Genitourinary: Negative for difficulty urinating, dysuria, flank pain and hematuria.  Musculoskeletal: Negative for arthralgias, back pain, gait problem, joint swelling and myalgias.  Skin: Negative for pallor and wound.  Neurological: Negative for dizziness, tremors, weakness and light-headedness.  Hematological: Negative for adenopathy. Does  not bruise/bleed easily.  Psychiatric/Behavioral: Negative for agitation, behavioral problems, confusion, decreased concentration, dysphoric mood and sleep disturbance.     Physical Exam  Constitutional: He is oriented to person, place, and time. He appears well-nourished. No distress.  HENT:  Head: Normocephalic and atraumatic.  Mouth/Throat: Oropharynx is clear and moist. No oropharyngeal exudate.    Eyes: Conjunctivae and EOM are normal. No scleral icterus.  Neck: Normal range of motion. Neck supple.  Cardiovascular: Normal rate and regular rhythm.  Exam reveals no friction rub.   Pulmonary/Chest: Effort normal and breath sounds normal. No respiratory distress. He has no wheezes.  Abdominal: Soft. He exhibits no distension.  Musculoskeletal: He exhibits no tenderness.  Neurological: He is alert and oriented to person, place, and time. He exhibits normal muscle tone. Coordination normal.  Skin: Skin is warm and dry. Rash noted. He is not diaphoretic. No erythema. No pallor.     Psychiatric: He has a normal mood and affect. His behavior is normal. Judgment and thought content normal.    I did not take pictures today but his overall appearance was similar to what is described below in photographic form in terms of edema the left edge of his lips seemed a bit swollen to be stated that this is because he had accidentally hit his mouth against an object which would cause the swelling.  Facial edema 04/27/15:      Skin: Still with pedunculated lesions on his fingers several visits ago    07/15/14:        Assessment/Plan:   #1 HIV: Check labs today renew BIKTARVY prescription send one month supply to [facility] using advancing access. Return to clinic and [facility] in a few weeks and see me in a month  #3 Disseminated hsitoplasmosis: hopefully cured but risk for relapse if off meds for sufficient amount of time  I spent greater than 25 minutes with the patient  including greater than 50% of time in face to face counsel of the patient regarding need to be highly adherent to his regimen, consequences of not doing so, need to come to clinic early to renew the ADAP program explanation of how we would still meds in the meantime and in coordination of his care.

## 2016-09-26 LAB — LIPID PANEL
CHOLESTEROL: 291 mg/dL — AB (ref <200)
HDL: 51 mg/dL (ref >40)
LDL Cholesterol (Calc): 200 mg/dL (calc) — ABNORMAL HIGH
NON-HDL CHOLESTEROL (CALC): 240 mg/dL — AB (ref <130)
TRIGLYCERIDES: 210 mg/dL — AB (ref <150)
Total CHOL/HDL Ratio: 5.7 (calc) — ABNORMAL HIGH (ref <5.0)

## 2016-09-26 LAB — CBC WITH DIFFERENTIAL/PLATELET
BASOS PCT: 1.2 %
Basophils Absolute: 61 cells/uL (ref 0–200)
EOS PCT: 6.3 %
Eosinophils Absolute: 321 cells/uL (ref 15–500)
HCT: 36.9 % — ABNORMAL LOW (ref 38.5–50.0)
Hemoglobin: 12.7 g/dL — ABNORMAL LOW (ref 13.2–17.1)
Lymphs Abs: 1255 cells/uL (ref 850–3900)
MCH: 31.9 pg (ref 27.0–33.0)
MCHC: 34.4 g/dL (ref 32.0–36.0)
MCV: 92.7 fL (ref 80.0–100.0)
MONOS PCT: 5.3 %
MPV: 10.1 fL (ref 7.5–12.5)
NEUTROS PCT: 62.6 %
Neutro Abs: 3193 cells/uL (ref 1500–7800)
PLATELETS: 186 10*3/uL (ref 140–400)
RBC: 3.98 10*6/uL — AB (ref 4.20–5.80)
RDW: 15 % (ref 11.0–15.0)
TOTAL LYMPHOCYTE: 24.6 %
WBC mixed population: 270 cells/uL (ref 200–950)
WBC: 5.1 10*3/uL (ref 3.8–10.8)

## 2016-09-26 LAB — T-HELPER CELL (CD4) - (RCID CLINIC ONLY)
CD4 % Helper T Cell: 19 % — ABNORMAL LOW (ref 33–55)
CD4 T CELL ABS: 270 /uL — AB (ref 400–2700)

## 2016-09-26 LAB — COMPLETE METABOLIC PANEL WITH GFR
AG Ratio: 1.4 (calc) (ref 1.0–2.5)
ALBUMIN MSPROF: 4.8 g/dL (ref 3.6–5.1)
ALKALINE PHOSPHATASE (APISO): 50 U/L (ref 40–115)
ALT: 50 U/L — ABNORMAL HIGH (ref 9–46)
AST: 113 U/L — AB (ref 10–40)
BILIRUBIN TOTAL: 0.8 mg/dL (ref 0.2–1.2)
BUN / CREAT RATIO: 12 (calc) (ref 6–22)
BUN: 21 mg/dL (ref 7–25)
CHLORIDE: 99 mmol/L (ref 98–110)
CO2: 30 mmol/L (ref 20–32)
Calcium: 9.5 mg/dL (ref 8.6–10.3)
Creat: 1.73 mg/dL — ABNORMAL HIGH (ref 0.60–1.35)
GFR, Est African American: 57 mL/min/{1.73_m2} — ABNORMAL LOW (ref >=60)
GFR, Est Non African American: 49 mL/min/{1.73_m2} — ABNORMAL LOW (ref >=60)
GLOBULIN: 3.5 g/dL (ref 1.9–3.7)
GLUCOSE: 96 mg/dL (ref 65–99)
Potassium: 4.2 mmol/L (ref 3.5–5.3)
SODIUM: 138 mmol/L (ref 135–146)
Total Protein: 8.3 g/dL — ABNORMAL HIGH (ref 6.1–8.1)

## 2016-09-26 LAB — URINE CYTOLOGY ANCILLARY ONLY
Chlamydia: NEGATIVE
Neisseria Gonorrhea: NEGATIVE

## 2016-09-26 LAB — RPR: RPR Ser Ql: NONREACTIVE

## 2016-09-27 ENCOUNTER — Encounter: Payer: Self-pay | Admitting: Infectious Disease

## 2016-09-30 LAB — HIV RNA, RTPCR W/R GT (RTI, PI,INT)
HIV 1 RNA QUANT: DETECTED {copies}/mL
HIV-1 RNA Quant, Log: <1.3 Log copies/mL

## 2016-10-18 ENCOUNTER — Ambulatory Visit (INDEPENDENT_AMBULATORY_CARE_PROVIDER_SITE_OTHER): Payer: Self-pay | Admitting: Pharmacist

## 2016-10-18 DIAGNOSIS — B20 Human immunodeficiency virus [HIV] disease: Secondary | ICD-10-CM
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
## 2016-10-19 ENCOUNTER — Other Ambulatory Visit: Payer: Self-pay | Admitting: Pharmacist

## 2016-10-19 DIAGNOSIS — B20 Human immunodeficiency virus [HIV] disease: Secondary | ICD-10-CM

## 2016-10-19 MED ORDER — BICTEGRAVIR-EMTRICITAB-TENOFOV 50-200-25 MG PO TABS
1.0000 | ORAL_TABLET | Freq: Every day | ORAL | 3 refills | Status: DC
Start: 2016-10-19 — End: 2016-10-31

## 2016-10-31 ENCOUNTER — Other Ambulatory Visit: Payer: Self-pay | Admitting: Pharmacist

## 2016-10-31 DIAGNOSIS — B20 Human immunodeficiency virus [HIV] disease: Secondary | ICD-10-CM

## 2016-10-31 MED ORDER — BICTEGRAVIR-EMTRICITAB-TENOFOV 50-200-25 MG PO TABS: 1.0000 | ORAL_TABLET | Freq: Every day | ORAL | 5 refills | Status: DC

## 2016-11-15 ENCOUNTER — Ambulatory Visit (INDEPENDENT_AMBULATORY_CARE_PROVIDER_SITE_OTHER): Payer: Self-pay | Admitting: Infectious Disease

## 2016-11-15 ENCOUNTER — Encounter: Payer: Self-pay | Admitting: Infectious Disease

## 2016-11-15 VITALS — BP 100/63 | HR 70 | Temp 98.3°F | Wt 250.0 lb

## 2016-11-15 DIAGNOSIS — B078 Other viral warts: Secondary | ICD-10-CM

## 2016-11-15 DIAGNOSIS — B399 Histoplasmosis, unspecified: Secondary | ICD-10-CM

## 2016-11-15 DIAGNOSIS — B20 Human immunodeficiency virus [HIV] disease: Secondary | ICD-10-CM

## 2016-11-15 MED ORDER — BICTEGRAVIR-EMTRICITAB-TENOFOV 50-200-25 MG PO TABS: 1.0000 | ORAL_TABLET | Freq: Every day | ORAL | 11 refills | Status: DC

## 2016-11-15 MED ORDER — IMIQUIMOD 5 % EX CREA: TOPICAL_CREAM | CUTANEOUS | 2 refills | Status: DC

## 2016-11-15 NOTE — Progress Notes (Signed)
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   He had been previously treated with PREZCOBIX and DESCOVY with nice virological suppression recently changed to [facility] with continued suppression. He had been late and renewing his ADAP and we will need to bridge him with the advancing access program via filling one month's drugs at [facility].  He met with myself Francina Ames, Juliann Pulse, and to ensure all of these processes put into place at last visit.   His VL was <20, CD4 count was healthy.  He now states that he is getting his medications regularly from [facility] and the ADAP is active.  He will need to renew this in January and I counseled him with regards to this.  He is still suffering from warts being present throughout both hands that are extensive in nature they responded somewhat to Aldara but not sufficiently to get a good resolution.  He lacks insurance and is not a citizen so this limits our options.  I would like to see if we can send him to [facility] since we do not have a dermatology clinic here in network that would see an uninsured patient  Lab Results  Component Value Date   HIV1RNAQUANT <20 DETECTED 09/25/2016   HIV1RNAQUANT <20 NOT DETECTED 04/19/2016   HIV1RNAQUANT <20 11/22/2015   Lab Results  Component Value Date   CD4TABS 270 (L) 09/25/2016   CD4TABS 240 (L) 03/22/2016   CD4TABS 200 (L) 11/22/2015      Past Medical History:  Diagnosis Date  . Allergic reaction 03/22/2016  . Change in voice 03/22/2016  . CKD (chronic kidney disease), stage II   . 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 Possible recurrence 2014 (currently being evaluated  05/2012)  . Edema 04/27/2015  . HIV (human immunodeficiency virus infection) ([place])   . Pancytopenia ([place])   . 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 Throat swelling 03/22/2016    Past Surgical History:  Procedure Laterality Date  . NO PAST SURGERIES      Family History  Problem Relation Age of Onset  . Coronary artery disease Mother        died from MI @ age 4 yo      Social History   Socioeconomic History  . Marital status: Single    Spouse name: None  . Number of children: 0  . Years of education: 6th grade  . Highest education level: None  Social Needs  . Financial resource strain: None  . Food insecurity - worry: None  . Food insecurity - inability: None  . Transportation needs - medical: None  . Transportation needs - non-medical: None  Occupational History  . Occupation: Teacher, music: landscaping  Tobacco Use  . Smoking status: Never Smoker  . Smokeless tobacco: Never Used  Substance and Sexual Activity  . Alcohol use: No    Alcohol/week: 3.0 oz    Types: 6 drink(s) per week  . Drug use: No  . Sexual activity: Yes    Partners: Female    Comment: given condoms  Other Topics Concern  . None  Social History Narrative   Lives in [place] with his wife and 3  nephews/nieces. Is spanish speaking only - requiring interpretor. He is able to read and write in Spanish only    Allergies  Allergen Reactions  . Sulfa Antibiotics Other (See Comments)    He believes he has experienced throat swelling with prezcobix and Descovy for nearly a year since April 2017 to present March 22, 2016     Current Outpatient Medications:  .  acetaminophen (TYLENOL) 325 MG tablet, Take 1 tablet (325 mg total) by mouth every 6 (six) hours as needed., Disp: , Rfl:  .  bictegravir-emtricitabine-tenofovir AF (BIKTARVY) 50-200-25 MG TABS tablet, Take 1 tablet by mouth daily., Disp: 30 tablet, Rfl: 5 .  imiquimod (ALDARA) 5 % cream, Apply topically 3 (three)  times a week. (Patient not taking: Reported on 09/25/2016), Disp: 12 each, Rfl: 2   Review of Systems  Constitutional: Negative for appetite change, chills, diaphoresis and fever.  HENT: Negative for congestion, rhinorrhea, sinus pressure, sneezing, sore throat, trouble swallowing and voice change.   Eyes: Negative for photophobia and visual disturbance.  Respiratory: Negative for chest tightness, shortness of breath, wheezing and stridor.   Cardiovascular: Negative for chest pain and palpitations.  Gastrointestinal: Negative for abdominal distention, abdominal pain, anal bleeding, blood in stool, constipation, diarrhea, nausea and vomiting.  Genitourinary: Negative for difficulty urinating, dysuria, flank pain and hematuria.  Musculoskeletal: Negative for arthralgias, back pain, gait problem, joint swelling and myalgias.  Skin: Positive for rash. Negative for pallor and wound.  Neurological: Negative for dizziness, tremors, weakness and light-headedness.  Hematological: Negative for adenopathy. Does not bruise/bleed easily.  Psychiatric/Behavioral: Negative for agitation, behavioral problems, confusion, decreased concentration, dysphoric mood and sleep disturbance.     Physical Exam  Constitutional: He is oriented to person, place, and time. He appears well-nourished. No distress.  HENT:  Head: Normocephalic and atraumatic.  Mouth/Throat: Oropharynx is clear and moist. No oropharyngeal exudate.  Eyes: Conjunctivae and EOM are normal. No scleral icterus.  Neck: Normal range of motion. Neck supple.  Cardiovascular: Normal rate and regular rhythm. Exam reveals no friction rub.  Pulmonary/Chest: Effort normal and breath sounds normal. No respiratory distress. He has no wheezes.  Abdominal: Soft. He exhibits no distension.  Musculoskeletal: He exhibits no tenderness.  Neurological: He is alert and oriented to person, place, and time. He exhibits normal muscle tone. Coordination normal.    Skin: Skin is warm and dry. Rash noted. He is not diaphoretic. No erythema. No pallor.  Psychiatric: He has a normal mood and affect. His behavior is normal. Judgment and thought content normal.    Skin: Still with pedunculated lesions on his fingers several visits ago    07/15/14:     Today's visit 11/15/16:       Assessment/Plan:   #1 HIV: Check labs today renew BIKTARVY prescription. RTC in January for ADAP renewal  #2 Extensive verrucae which are worse: refer to [facility], in the interim try Aldara  #3 Disseminated hsitoplasmosis: hopefully cured but risk for relapse if off meds for sufficient amount of time  I spent greater than 25 minutes with the patient including greater than 50% of time in face to face counsel of the patient and in coordination of his care review of all of his labs over the last several years pointing out times where he had been noncompliant and other times where he had been highly adherent.  I emphasized that at present based on his labs his viral load was suppressed and now at undetectable levels and that his immune  system is currently being protected from his HIV but it needs to remains this way provided he stays highly adherent to his antiretroviral medications.

## 2016-11-16 LAB — COMPLETE METABOLIC PANEL WITH GFR
AG RATIO: 1.5 (calc) (ref 1.0–2.5)
ALBUMIN MSPROF: 4.6 g/dL (ref 3.6–5.1)
ALT: 38 U/L (ref 9–46)
AST: 120 U/L — AB (ref 10–40)
Alkaline phosphatase (APISO): 49 U/L (ref 40–115)
BUN / CREAT RATIO: 9 (calc) (ref 6–22)
BUN: 14 mg/dL (ref 7–25)
CALCIUM: 9.8 mg/dL (ref 8.6–10.3)
CO2: 35 mmol/L — AB (ref 20–32)
CREATININE: 1.51 mg/dL — AB (ref 0.60–1.35)
Chloride: 94 mmol/L — ABNORMAL LOW (ref 98–110)
GFR, EST AFRICAN AMERICAN: 67 mL/min/{1.73_m2} (ref >=60)
GFR, EST NON AFRICAN AMERICAN: 58 mL/min/{1.73_m2} — AB (ref >=60)
GLOBULIN: 3.1 g/dL (ref 1.9–3.7)
Glucose, Bld: 85 mg/dL (ref 65–99)
POTASSIUM: 4.8 mmol/L (ref 3.5–5.3)
SODIUM: 136 mmol/L (ref 135–146)
TOTAL PROTEIN: 7.7 g/dL (ref 6.1–8.1)
Total Bilirubin: 0.7 mg/dL (ref 0.2–1.2)

## 2016-11-16 LAB — CBC WITH DIFFERENTIAL/PLATELET
BASOS ABS: 48 {cells}/uL (ref 0–200)
BASOS PCT: 1.1 %
EOS ABS: 304 {cells}/uL (ref 15–500)
EOS PCT: 6.9 %
HEMATOCRIT: 36.8 % — AB (ref 38.5–50.0)
HEMOGLOBIN: 12.4 g/dL — AB (ref 13.2–17.1)
Lymphs Abs: 1060 cells/uL (ref 850–3900)
MCH: 31.5 pg (ref 27.0–33.0)
MCHC: 33.7 g/dL (ref 32.0–36.0)
MCV: 93.4 fL (ref 80.0–100.0)
MONOS PCT: 4.8 %
MPV: 9.4 fL (ref 7.5–12.5)
Neutro Abs: 2776 cells/uL (ref 1500–7800)
Neutrophils Relative %: 63.1 %
Platelets: 199 10*3/uL (ref 140–400)
RBC: 3.94 10*6/uL — ABNORMAL LOW (ref 4.20–5.80)
RDW: 14.5 % (ref 11.0–15.0)
Total Lymphocyte: 24.1 %
WBC: 4.4 10*3/uL (ref 3.8–10.8)
WBCMIX: 211 {cells}/uL (ref 200–950)

## 2016-11-16 LAB — RPR: RPR Ser Ql: NONREACTIVE

## 2016-11-16 LAB — T-HELPER CELL (CD4) - (RCID CLINIC ONLY)
CD4 T CELL ABS: 230 /uL — AB (ref 400–2700)
CD4 T CELL HELPER: 20 % — AB (ref 33–55)

## 2016-11-17 LAB — HIV RNA, RTPCR W/R GT (RTI, PI,INT)
HIV 1 RNA QUANT: NOT DETECTED {copies}/mL
HIV-1 RNA Quant, Log: <1.3 Log copies/mL

## 2017-01-15 ENCOUNTER — Ambulatory Visit (INDEPENDENT_AMBULATORY_CARE_PROVIDER_SITE_OTHER): Payer: Self-pay | Admitting: Infectious Disease

## 2017-01-15 VITALS — BP 107/72 | HR 72 | Temp 99.0°F | Wt 256.0 lb

## 2017-01-15 DIAGNOSIS — Z23 Encounter for immunization: Secondary | ICD-10-CM

## 2017-01-15 DIAGNOSIS — B20 Human immunodeficiency virus [HIV] disease: Secondary | ICD-10-CM

## 2017-01-15 DIAGNOSIS — B399 Histoplasmosis, unspecified: Secondary | ICD-10-CM

## 2017-01-15 DIAGNOSIS — N182 Chronic kidney disease, stage 2 (mild): Secondary | ICD-10-CM

## 2017-01-15 DIAGNOSIS — B078 Other viral warts: Secondary | ICD-10-CM

## 2017-01-15 NOTE — Progress Notes (Signed)
       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   He had been previously treated with PREZCOBIX and DESCOVY with nice virological suppression recently changed to BIKTARVY with continued suppression. He had been late and renewing his ADAP and we will need to bridge him with the advancing access program via filling one month's drugs at  outpatient pharmacy.  He met with myself Manuel Cassie, Kathy, and to ensure all of these processes put into place at last visit.     Lab Results  Component Value Date   HIV1RNAQUANT <20 NOT DETECTED 11/15/2016   HIV1RNAQUANT <20 DETECTED 09/25/2016   HIV1RNAQUANT <20 NOT DETECTED 04/19/2016   Lab Results  Component Value Date   CD4TABS 230 (L) 11/15/2016   CD4TABS 270 (L) 09/25/2016   CD4TABS 240 (L) 03/22/2016   He remains nicely suppressed and is taking his BIKTARVY he claims not to have missed any doses at all since I last saw him.  I had referred him to Dr. Jorizo at [facility] but they had called him 3X to setup appt and he had not returned call. Ashley spoke with Dermatology there and they CAN see him IF he calls them and answers a few questions.    Past Medical History:  Diagnosis Date  . Allergic reaction 03/22/2016  . Change in voice 03/22/2016  . CKD (chronic kidney disease), stage II   . 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 Possible recurrence 2014 (currently being evaluated 05/2012)  . Edema 04/27/2015  . HIV (human immunodeficiency virus infection) (H[facility])   . Pancytopenia (HCC)   . 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 Throat swelling 03/22/2016    Past Surgical History:  Procedure Laterality Date  . NO PAST SURGERIES       Family History  Problem Relation Age of Onset  . Coronary artery disease Mother        died from MI @ age 70 yo      Social History   Socioeconomic History  . Marital status: Single    Spouse name: Not on file  . Number of children: 0  . Years of education: 6th grade  . Highest education level: Not on file  Social Needs  . Financial resource strain: Not on file  . Food insecurity - worry: Not on file  . Food insecurity - inability: Not on file  . Transportation needs - medical: Not on file  . Transportation needs - non-medical: Not on file  Occupational History  . Occupation: Landscaping    Employer: landscaping  Tobacco Use  . Smoking status: Never Smoker  . Smokeless tobacco: Never Used  Substance and Sexual Activity  . Alcohol use: No    Alcohol/week: 3.0 oz    Types: 6 drink(s) per week  . Drug use: No  . Sexual activity: Yes    Partners: Female    Comment: given condoms  Other Topics Concern  . Not on file  Social History Narrative   Lives in [place] with his wife and 3 nephews/nieces. Is spanish speaking only - requiring interpretor. He is able to read and write in Spanish only    Allergies  Allergen Reactions  . Sulfa Antibiotics Other (See Comments)      He believes he has experienced throat swelling with prezcobix and Descovy for nearly a year since April 2017 to present March 22, 2016     Current Outpatient Medications:  .  acetaminophen (TYLENOL) 325 MG tablet, Take 1 tablet (325 mg total) by mouth every 6 (six) hours as needed., Disp: , Rfl:  .  bictegravir-emtricitabine-tenofovir AF (BIKTARVY) 50-200-25 MG TABS tablet, Take 1 tablet daily by mouth., Disp: 30 tablet, Rfl: 11 .  imiquimod (ALDARA) 5 % cream, Apply 3 (three) times a week topically., Disp: 12 each, Rfl: 2   Review of Systems  Constitutional: Negative for appetite change, chills, diaphoresis and fever.  HENT: Negative for congestion, rhinorrhea, sinus pressure, sneezing, sore  throat, trouble swallowing and voice change.   Eyes: Negative for photophobia and visual disturbance.  Respiratory: Negative for chest tightness, shortness of breath, wheezing and stridor.   Cardiovascular: Negative for chest pain and palpitations.  Gastrointestinal: Negative for abdominal distention, abdominal pain, anal bleeding, blood in stool, constipation, diarrhea, nausea and vomiting.  Genitourinary: Negative for difficulty urinating, dysuria, flank pain and hematuria.  Musculoskeletal: Negative for arthralgias, back pain, gait problem, joint swelling and myalgias.  Skin: Positive for rash. Negative for pallor and wound.  Neurological: Negative for dizziness, tremors, weakness and light-headedness.  Hematological: Negative for adenopathy. Does not bruise/bleed easily.  Psychiatric/Behavioral: Negative for agitation, behavioral problems, confusion, decreased concentration, dysphoric mood and sleep disturbance.     Physical Exam  Constitutional: He is oriented to person, place, and time. He appears well-nourished. No distress.  HENT:  Head: Normocephalic and atraumatic.  Mouth/Throat: Oropharynx is clear and moist. No oropharyngeal exudate.  Eyes: Conjunctivae and EOM are normal. No scleral icterus.  Neck: Normal range of motion. Neck supple.  Cardiovascular: Normal rate and regular rhythm. Exam reveals no friction rub.  Pulmonary/Chest: Effort normal and breath sounds normal. No respiratory distress. He has no wheezes.  Abdominal: Soft. He exhibits no distension.  Musculoskeletal: He exhibits no tenderness.  Neurological: He is alert and oriented to person, place, and time. He exhibits normal muscle tone. Coordination normal.  Skin: Skin is warm and dry. Rash noted. He is not diaphoretic. No erythema. No pallor.  Psychiatric: He has a normal mood and affect. His behavior is normal. Judgment and thought content normal.  Nursing note and vitals reviewed.   Skin: Still with  pedunculated lesions on his fingers several visits ago    07/15/14:     Today's visit 11/15/16:    Hands unchanged today on 01/15/17   Assessment/Plan:   #1 HIV: continue BIKTARVY prescription. Renew HMAP and then rtc in July and get labs, see me afterwards and renew HMAP  #2 Extensive verrucae which are worse: referred again to [facility]. Ashley has called them and now Jadie has to make the phone call to get in there and he should do so.  #3 Disseminated hsitoplasmosis: hopefully cured but risk for relapse if off meds for sufficient amount of time  I spent greater than 25 minutes with the patient including greater than 50% of time in face to face counsel of the patient re nature of HIV infection, importance of maintaining virological suppression to preserve immune health and prevent infection , emphasizing critical part of his care being renewal of HMAP twice yearly and in coordination of his care.  

## 2017-01-15 NOTE — Addendum Note (Signed)
Addended by: Gildardo GriffesHILL, Kareem Aul M on: 01/15/2017 11:08 AM   Modules accepted: Orders

## 2017-02-12 ENCOUNTER — Encounter: Payer: Self-pay | Admitting: Infectious Disease

## 2017-03-05 ENCOUNTER — Encounter: Payer: Self-pay | Admitting: Infectious Diseases

## 2017-03-05 ENCOUNTER — Ambulatory Visit (INDEPENDENT_AMBULATORY_CARE_PROVIDER_SITE_OTHER): Payer: Self-pay | Admitting: Infectious Diseases

## 2017-03-05 VITALS — BP 130/77 | HR 69 | Temp 97.6°F | Wt 262.0 lb

## 2017-03-05 DIAGNOSIS — B078 Other viral warts: Secondary | ICD-10-CM

## 2017-03-05 DIAGNOSIS — R601 Generalized edema: Secondary | ICD-10-CM

## 2017-03-05 DIAGNOSIS — B20 Human immunodeficiency virus [HIV] disease: Secondary | ICD-10-CM

## 2017-03-05 DIAGNOSIS — T7840XD Allergy, unspecified, subsequent encounter: Secondary | ICD-10-CM

## 2017-03-05 MED ORDER — METHYLPREDNISOLONE SODIUM SUCC 125 MG IJ SOLR
125.0000 mg | Freq: Once | INTRAMUSCULAR | Status: AC
  Administered 2017-03-05: 125 mg via INTRAMUSCULAR

## 2017-03-05 MED ORDER — PREDNISONE 10 MG (21) PO TBPK: ORAL_TABLET | ORAL | 0 refills | Status: DC

## 2017-03-05 MED ORDER — HYDROXYZINE HCL 10 MG PO TABS: 10.0000 mg | ORAL_TABLET | Freq: Three times a day (TID) | ORAL | 0 refills | Status: DC | PRN

## 2017-03-05 NOTE — Assessment & Plan Note (Signed)
He appears to be doing well on his biktarvy HIV 1 RNA Quant (copies/mL)  Date Value  11/15/2016 <20 NOT DETECTED  09/25/2016 <20 DETECTED  04/19/2016 <20 NOT DETECTED   CD4 T Cell Abs (/uL)  Date Value  11/15/2016 230 (L)  09/25/2016 270 (L)  03/22/2016 240 (L)    F/u labs when he sees his PCP.

## 2017-03-05 NOTE — Assessment & Plan Note (Signed)
Please see "generalized edema" section.  Will try to get him into allergy.

## 2017-03-05 NOTE — Progress Notes (Signed)
   Subjective:    Patient ID: David Knapp, David Knapp    DOB: July 11, 1980, 37 y.o.   MRN: [id number]  HPI 37 yo M from [place] (came to [place] ~2004), HIV/AIDS and disseminated histoplasmosis then recurrent disseminated histoplasmosis with CNS involvement sp 2 weeks ampho (see prior notes) and sp itraconazole  for nearly 2 years.   Treated with PREZCOBIX/DESCOVY --> BIKTARVY  He also has a hx of severe molluscom/condyloma. Has been referred to [facility] derm.   He is seen today with phone interpreter.  Today he comes to clinic for "swelling" of his hands, legs, and face. Has been sleeping more. Swelling started around 8 days ago.  No new medications. No new soaps, shampoos, detergents.  Has pain in his feet.  No problems with breathing or swallowing. Voice has changed, hoarse.   Review of Systems  Constitutional: Negative for appetite change, chills, fever and unexpected weight change.  HENT: Positive for voice change.   Respiratory: Negative for cough and shortness of breath.   Gastrointestinal: Negative for constipation and diarrhea.  Genitourinary: Negative for difficulty urinating.  Skin: Positive for rash.  Please see HPI. All other systems reviewed and negative.     Objective:   Physical Exam  Constitutional: He appears well-developed and well-nourished. No distress.  HENT:  Mouth/Throat: No oropharyngeal exudate.  Eyes: EOM are normal. Pupils are equal, round, and reactive to light.  Neck: Neck supple.  Cardiovascular: Normal rate, regular rhythm and normal heart sounds.  Pulmonary/Chest: Effort normal and breath sounds normal. No respiratory distress. He has no wheezes. He has no rales.  Abdominal: Soft. Bowel sounds are normal. There is no tenderness. There is no rebound.  Musculoskeletal: He exhibits edema.  Lymphadenopathy:    He has no cervical adenopathy.  Skin:     Psychiatric: He has a normal mood and affect.       Assessment & Plan:

## 2017-03-05 NOTE — Assessment & Plan Note (Addendum)
He is only taking biktarvy.  Will give him solumedrol injection.  Will give him steroid taper, atrarax.  Will try to have him seen by allergy.  Gave him explicit directions via interpreter regarding worsening of swallowing, dysphagia, SOB to go to ED.

## 2017-03-05 NOTE — Assessment & Plan Note (Signed)
Will f/u with Morrie SheldonAshley to see status of this appt.

## 2017-04-02 DIAGNOSIS — M7989 Other specified soft tissue disorders: Secondary | ICD-10-CM

## 2017-04-02 HISTORY — DX: Other specified soft tissue disorders: M79.89

## 2017-04-22 ENCOUNTER — Emergency Department (HOSPITAL_COMMUNITY): Payer: Self-pay

## 2017-04-22 ENCOUNTER — Emergency Department (HOSPITAL_COMMUNITY)
Admission: EM | Admit: 2017-04-22 | Discharge: 2017-04-23 | Disposition: A | Discharge status: Home / Self Care | Payer: Self-pay | Source: Home / Self Care | Attending: Emergency Medicine | Admitting: Emergency Medicine

## 2017-04-22 ENCOUNTER — Encounter (HOSPITAL_COMMUNITY): Payer: Self-pay | Admitting: Emergency Medicine

## 2017-04-22 DIAGNOSIS — R609 Edema, unspecified: Secondary | ICD-10-CM

## 2017-04-22 NOTE — ED Notes (Signed)
Delay in lab draw,  Pt currently in xray. 

## 2017-04-22 NOTE — ED Notes (Signed)
Pt to CT via WC

## 2017-04-22 NOTE — ED Triage Notes (Signed)
Pt presents with generalized swelling, with pain to L hand. Pt speaks limited english, pt states he was here recently for same, feels like swelling has gotten worse. No airway compromise at this time. Pt very slow to answer questions.

## 2017-04-23 ENCOUNTER — Encounter (HOSPITAL_COMMUNITY): Payer: Self-pay | Admitting: Internal Medicine

## 2017-04-23 ENCOUNTER — Ambulatory Visit (INDEPENDENT_AMBULATORY_CARE_PROVIDER_SITE_OTHER): Payer: Self-pay | Admitting: Family

## 2017-04-23 ENCOUNTER — Emergency Department (HOSPITAL_COMMUNITY): Payer: Self-pay

## 2017-04-23 ENCOUNTER — Other Ambulatory Visit: Payer: Self-pay

## 2017-04-23 ENCOUNTER — Inpatient Hospital Stay (HOSPITAL_COMMUNITY)
Admission: AD | Admit: 2017-04-23 | Discharge: 2017-04-27 | DRG: 081 | Disposition: A | Discharge status: Home / Self Care | Payer: Self-pay | Source: Ambulatory Visit | Attending: Family Medicine | Admitting: Family Medicine

## 2017-04-23 ENCOUNTER — Encounter: Payer: Self-pay | Admitting: Family

## 2017-04-23 VITALS — BP 129/81 | HR 69 | Temp 98.8°F | Wt 266.0 lb

## 2017-04-23 DIAGNOSIS — I5032 Chronic diastolic (congestive) heart failure: Secondary | ICD-10-CM | POA: Diagnosis present

## 2017-04-23 DIAGNOSIS — I509 Heart failure, unspecified: Secondary | ICD-10-CM

## 2017-04-23 DIAGNOSIS — Z882 Allergy status to sulfonamides status: Secondary | ICD-10-CM

## 2017-04-23 DIAGNOSIS — B20 Human immunodeficiency virus [HIV] disease: Secondary | ICD-10-CM

## 2017-04-23 DIAGNOSIS — Z8249 Family history of ischemic heart disease and other diseases of the circulatory system: Secondary | ICD-10-CM

## 2017-04-23 DIAGNOSIS — R21 Rash and other nonspecific skin eruption: Secondary | ICD-10-CM

## 2017-04-23 DIAGNOSIS — E039 Hypothyroidism, unspecified: Secondary | ICD-10-CM | POA: Diagnosis present

## 2017-04-23 DIAGNOSIS — R601 Generalized edema: Secondary | ICD-10-CM

## 2017-04-23 DIAGNOSIS — E669 Obesity, unspecified: Secondary | ICD-10-CM | POA: Diagnosis present

## 2017-04-23 DIAGNOSIS — I440 Atrioventricular block, first degree: Secondary | ICD-10-CM | POA: Diagnosis present

## 2017-04-23 DIAGNOSIS — I313 Pericardial effusion (noninflammatory): Secondary | ICD-10-CM | POA: Diagnosis present

## 2017-04-23 DIAGNOSIS — R600 Localized edema: Secondary | ICD-10-CM | POA: Diagnosis present

## 2017-04-23 DIAGNOSIS — B079 Viral wart, unspecified: Secondary | ICD-10-CM | POA: Diagnosis present

## 2017-04-23 DIAGNOSIS — E871 Hypo-osmolality and hyponatremia: Secondary | ICD-10-CM | POA: Diagnosis present

## 2017-04-23 DIAGNOSIS — N183 Chronic kidney disease, stage 3 unspecified: Secondary | ICD-10-CM | POA: Diagnosis present

## 2017-04-23 DIAGNOSIS — N182 Chronic kidney disease, stage 2 (mild): Secondary | ICD-10-CM | POA: Diagnosis present

## 2017-04-23 DIAGNOSIS — D631 Anemia in chronic kidney disease: Secondary | ICD-10-CM | POA: Diagnosis present

## 2017-04-23 DIAGNOSIS — Z6837 Body mass index (BMI) 37.0-37.9, adult: Secondary | ICD-10-CM

## 2017-04-23 DIAGNOSIS — E035 Myxedema coma: Principal | ICD-10-CM | POA: Diagnosis present

## 2017-04-23 DIAGNOSIS — G4733 Obstructive sleep apnea (adult) (pediatric): Secondary | ICD-10-CM | POA: Diagnosis present

## 2017-04-23 DIAGNOSIS — R001 Bradycardia, unspecified: Secondary | ICD-10-CM | POA: Diagnosis present

## 2017-04-23 DIAGNOSIS — R609 Edema, unspecified: Secondary | ICD-10-CM | POA: Diagnosis present

## 2017-04-23 DIAGNOSIS — Z8619 Personal history of other infectious and parasitic diseases: Secondary | ICD-10-CM

## 2017-04-23 DIAGNOSIS — Z79899 Other long term (current) drug therapy: Secondary | ICD-10-CM

## 2017-04-23 DIAGNOSIS — R945 Abnormal results of liver function studies: Secondary | ICD-10-CM | POA: Diagnosis present

## 2017-04-23 DIAGNOSIS — I878 Other specified disorders of veins: Secondary | ICD-10-CM | POA: Diagnosis present

## 2017-04-23 HISTORY — DX: Other specified soft tissue disorders: M79.89

## 2017-04-23 HISTORY — DX: Heart failure, unspecified: I50.9

## 2017-04-23 HISTORY — DX: Hypothyroidism, unspecified: E03.9

## 2017-04-23 LAB — CBC WITH DIFFERENTIAL/PLATELET
BASOS ABS: 0 10*3/uL (ref 0.0–0.1)
BASOS PCT: 0 %
Eosinophils Absolute: 0.2 10*3/uL (ref 0.0–0.7)
Eosinophils Relative: 5 %
HCT: 36.9 % — ABNORMAL LOW (ref 39.0–52.0)
HEMOGLOBIN: 12.7 g/dL — AB (ref 13.0–17.0)
Lymphocytes Relative: 13 %
Lymphs Abs: 0.6 10*3/uL — ABNORMAL LOW (ref 0.7–4.0)
MCH: 31.3 pg (ref 26.0–34.0)
MCHC: 34.4 g/dL (ref 30.0–36.0)
MCV: 90.9 fL (ref 78.0–100.0)
Monocytes Absolute: 0.1 10*3/uL (ref 0.1–1.0)
Monocytes Relative: 3 %
NEUTROS ABS: 3.7 10*3/uL (ref 1.7–7.7)
NEUTROS PCT: 79 %
Platelets: 185 10*3/uL (ref 150–400)
RBC: 4.06 MIL/uL — AB (ref 4.22–5.81)
RDW: 14.5 % (ref 11.5–15.5)
WBC: 4.7 10*3/uL (ref 4.0–10.5)

## 2017-04-23 LAB — HEPATIC FUNCTION PANEL
ALBUMIN: 4.5 g/dL (ref 3.5–5.0)
ALT: 54 U/L (ref 17–63)
AST: 164 U/L — ABNORMAL HIGH (ref 15–41)
Alkaline Phosphatase: 58 U/L (ref 38–126)
BILIRUBIN INDIRECT: 1.1 mg/dL — AB (ref 0.3–0.9)
Bilirubin, Direct: 0.1 mg/dL (ref 0.1–0.5)
TOTAL PROTEIN: 8 g/dL (ref 6.5–8.1)
Total Bilirubin: 1.2 mg/dL (ref 0.3–1.2)

## 2017-04-23 LAB — BASIC METABOLIC PANEL
ANION GAP: 11 (ref 5–15)
BUN: 12 mg/dL (ref 6–20)
CO2: 28 mmol/L (ref 22–32)
Calcium: 8.7 mg/dL — ABNORMAL LOW (ref 8.9–10.3)
Chloride: 86 mmol/L — ABNORMAL LOW (ref 101–111)
Creatinine, Ser: 1.3 mg/dL — ABNORMAL HIGH (ref 0.61–1.24)
Glucose, Bld: 101 mg/dL — ABNORMAL HIGH (ref 65–99)
Potassium: 4.4 mmol/L (ref 3.5–5.1)
SODIUM: 125 mmol/L — AB (ref 135–145)

## 2017-04-23 LAB — OSMOLALITY, URINE: OSMOLALITY UR: 699 mosm/kg (ref 300–900)

## 2017-04-23 LAB — URINALYSIS, ROUTINE W REFLEX MICROSCOPIC
Bilirubin Urine: NEGATIVE
GLUCOSE, UA: NEGATIVE mg/dL
HGB URINE DIPSTICK: NEGATIVE
Ketones, ur: NEGATIVE mg/dL
LEUKOCYTES UA: NEGATIVE
Nitrite: NEGATIVE
Protein, ur: NEGATIVE mg/dL
SPECIFIC GRAVITY, URINE: 1.018 (ref 1.005–1.030)
pH: 7 (ref 5.0–8.0)

## 2017-04-23 LAB — BRAIN NATRIURETIC PEPTIDE: B Natriuretic Peptide: 38.7 pg/mL (ref 0.0–100.0)

## 2017-04-23 LAB — I-STAT TROPONIN, ED: TROPONIN I, POC: 0 ng/mL (ref 0.00–0.08)

## 2017-04-23 LAB — SODIUM, URINE, RANDOM: Sodium, Ur: 126 mmol/L

## 2017-04-23 LAB — TSH: TSH: 96.129 u[IU]/mL — ABNORMAL HIGH (ref 0.350–4.500)

## 2017-04-23 LAB — T4, FREE: Free T4: <0.25 ng/dL — ABNORMAL LOW (ref 0.61–1.12)

## 2017-04-23 LAB — LIPASE, BLOOD: LIPASE: 30 U/L (ref 11–51)

## 2017-04-23 LAB — GAMMA GT: GGT: 16 U/L (ref 7–50)

## 2017-04-23 LAB — I-STAT CG4 LACTIC ACID, ED: Lactic Acid, Venous: 1.58 mmol/L (ref 0.5–1.9)

## 2017-04-23 LAB — OSMOLALITY: OSMOLALITY: 261 mosm/kg — AB (ref 275–295)

## 2017-04-23 LAB — AMMONIA: Ammonia: 34 umol/L (ref 9–35)

## 2017-04-23 LAB — TROPONIN I: Troponin I: <0.03 ng/mL (ref <0.03)

## 2017-04-23 MED ORDER — ONDANSETRON HCL 4 MG/2ML IJ SOLN: 4.0000 mg | Freq: Four times a day (QID) | INTRAMUSCULAR | Status: DC | PRN

## 2017-04-23 MED ORDER — HYDROXYZINE HCL 10 MG PO TABS
10.0000 mg | ORAL_TABLET | Freq: Three times a day (TID) | ORAL | Status: DC | PRN
  Filled 2017-04-23: qty 1

## 2017-04-23 MED ORDER — IMIQUIMOD 5 % EX CREA: TOPICAL_CREAM | CUTANEOUS | Status: DC

## 2017-04-23 MED ORDER — SODIUM CHLORIDE 0.9% FLUSH
3.0000 mL | Freq: Two times a day (BID) | INTRAVENOUS | Status: DC
  Administered 2017-04-23 – 2017-04-27 (×7): 3 mL via INTRAVENOUS

## 2017-04-23 MED ORDER — ENOXAPARIN SODIUM 40 MG/0.4ML ~~LOC~~ SOLN
40.0000 mg | SUBCUTANEOUS | Status: DC
  Administered 2017-04-23 – 2017-04-26 (×4): 40 mg via SUBCUTANEOUS
  Filled 2017-04-23 (×4): qty 0.4

## 2017-04-23 MED ORDER — SODIUM CHLORIDE 0.9% FLUSH: 3.0000 mL | INTRAVENOUS | Status: DC | PRN

## 2017-04-23 MED ORDER — FUROSEMIDE 10 MG/ML IJ SOLN: 40.0000 mg | Freq: Two times a day (BID) | INTRAMUSCULAR | Status: DC

## 2017-04-23 MED ORDER — SODIUM CHLORIDE 0.9 % IV SOLN: 250.0000 mL | INTRAVENOUS | Status: DC | PRN

## 2017-04-23 MED ORDER — ACETAMINOPHEN 325 MG PO TABS: 650.0000 mg | ORAL_TABLET | ORAL | Status: DC | PRN

## 2017-04-23 MED ORDER — BICTEGRAVIR-EMTRICITAB-TENOFOV 50-200-25 MG PO TABS
1.0000 | ORAL_TABLET | Freq: Every day | ORAL | Status: DC
Start: 2017-04-24 — End: 2017-04-27
  Administered 2017-04-24 – 2017-04-27 (×4): 1 via ORAL
  Filled 2017-04-23 (×4): qty 1

## 2017-04-23 NOTE — H&P (Signed)
History and Physical    David Knapp:[id number] DOB: 07/01/1980 DOA: 04/23/2017  Referring MD/NP/PA: Dr. Marlin Canary PCP: Patient, No Pcp Per  Patient coming from: ID clinic  Chief Complaint: Swelling  I have personally briefly reviewed patient's old medical records in [facility]   HPI: David Knapp is a 37 y.o. male with medical history significant of HIV, CKD stage II, HFpEF, histoplasmosis, and pancytopenia; who presents with complaints of progressive swelling over the last several months.  History is obtained with the use of Spanish translator services.  He complains of swelling mostly in his legs and feet and makes it difficult for him to walk and causes him pain.  Estimates having an approximately 50+ pound weight gain in the last 2-3 months.  He was evaluated and treated with prednisone 3/4 and subsequently furosemide without relief of symptoms.  Associated symptoms include complaints of chills, generalized fatigue, dyspnea on exertion, decreased urine output, constipation and increased lethargy.  Denies any fever, chest pain, dysuria, cough, change in vision, nausea, vomiting, or diarrhea.  He admits to drinking 2-3 beers per week on average.  Patient takes all of his HIV medications as prescribed.  Lastly, patient reports that the warts on his hands have been there for long time and not significantly changed.  He followed-up at the ID clinic today for recent ER visit, and lab work was obtained showing relatively normal CBC, sodium 125, BUN 12, creatinine 1.3, AST 164, BNP 38.7,  and troponin 0.  Chest x-ray showed cardiomegaly with some ve central venous congestion.  Patient was directly admitted to a telemetry bed for further workup.   ED Course: as seen above  Review of Systems  Constitutional: Positive for chills. Negative for fever and weight loss.  HENT: Negative for congestion and nosebleeds.   Eyes: Negative for photophobia and pain.  Respiratory: Negative for  shortness of breath.   Cardiovascular: Positive for leg swelling. Negative for chest pain.  Gastrointestinal: Positive for constipation. Negative for abdominal pain and vomiting.  Genitourinary: Negative for dysuria and hematuria.  Musculoskeletal: Negative for joint pain.  Skin: Positive for rash. Negative for itching.  Neurological: Negative for focal weakness and loss of consciousness.  Endo/Heme/Allergies: Negative for environmental allergies and polydipsia.  Psychiatric/Behavioral: Negative for substance abuse. The patient is not nervous/anxious.     Past Medical History:  Diagnosis Date  . Allergic reaction 03/22/2016  . Change in voice 03/22/2016  . CKD (chronic kidney disease), stage II   . 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 Possible recurrence 2014 (currently being evaluated 05/2012)  . Edema 04/27/2015  . HIV (human immunodeficiency virus infection) (HCC)   . Pancytopenia (HCC)   . 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 Throat swelling 03/22/2016    Past Surgical History:  Procedure Laterality Date  . NO PAST SURGERIES       reports that he has never smoked. He has never used smokeless tobacco. He reports that he does not drink alcohol or use drugs.  Allergies  Allergen Reactions  . Sulfa Antibiotics Other (See Comments)    He believes he has experienced throat swelling with prezcobix and Descovy for nearly a year since April 2017 to present March 22, 2016    Family History  Problem Relation Age of Onset  . Coronary artery disease Mother        died from MI @ age 59 yo    Prior to Admission medications   Medication Sig Start Date End  Date Taking? Authorizing Provider  acetaminophen (TYLENOL) 325 MG tablet Take 1 tablet (325 mg total) by mouth every 6 (six) hours as needed. 05/23/12   Genelle GatherGlenn, Kathryn F, MD  bictegravir-emtricitabine-tenofovir AF [facility](BIKTARVY) [phone] MG TABS tablet Take 1 tablet daily by mouth. 11/15/16   Daiva EvesVan Dam,  Lisette Grinderornelius N, MD  hydrOXYzine (ATARAX/VISTARIL) 10 MG tablet Take 1 tablet (10 mg total) by mouth 3 (three) times daily as needed. 03/05/17   Ginnie SmartHatcher, Jeffrey C, MD  imiquimod (ALDARA) 5 % cream Apply 3 (three) times a week topically. 11/15/16   Randall HissVan Dam, Cornelius N, MD  predniSONE (STERAPRED UNI-PAK 21 TAB) 10 MG (21) TBPK tablet 5 tabs on day 1, 4 tabs on day 2, 3 tabs on day 3 and 4, 2 tabs on day 5 and 6 and 7 03/05/17   Ginnie SmartHatcher, Jeffrey C, MD    Physical Exam:  Constitutional: Obese male in NAD, calm, comfortable Vitals:   04/23/17 1916  BP: 103/63  Pulse: 61  Resp: 16  Temp: 98.6 F (37 C)  TempSrc: Oral  SpO2: 94%   Eyes: PERRL, lids and conjunctivae normal ENMT: Mucous membranes are moist. Posterior pharynx clear of any exudate or lesions.  Neck: normal, supple, no masses, no thyromegaly.  Mild JVD noted. Respiratory: Decreased overall aeration, but clear to auscultation bilaterally, no wheezing, no crackles. Normal respiratory effort. No accessory muscle use.  Cardiovascular:  Bradycardic, no murmurs / rubs / gallops.  3+ pitting lower extremity edema. 2+ pedal pulses. No carotid bruits.  Abdomen: no tenderness, no masses palpated. No hepatosplenomegaly. Bowel sounds positive.  Musculoskeletal: no clubbing / cyanosis.  Swelling noted of the bilateral hands and legs with decreased range of motion due to symptoms. Skin: multiple warts noted at the bilateral hands as well as lip Neurologic: CN 2-12 grossly intact. Sensation intact, DTR normal. Strength 5/5 in all 4.  Psychiatric: Normal judgment and insight. Alert and oriented x 3. Normal mood.     Labs on Admission: I have personally reviewed following labs and imaging studies  CBC: Recent Labs  Lab 04/23/17 0001  WBC 4.7  NEUTROABS 3.7  HGB 12.7*  HCT 36.9*  MCV 90.9  PLT 185   Basic Metabolic Panel: Recent Labs  Lab 04/23/17 0001  NA 125*  K 4.4  CL 86*  CO2 28  GLUCOSE 101*  BUN 12  CREATININE 1.30*    CALCIUM 8.7*   GFR: CrCl cannot be calculated (Unknown ideal weight.). Liver Function Tests: Recent Labs  Lab 04/23/17 0001  AST 164*  ALT 54  ALKPHOS 58  BILITOT 1.2  PROT 8.0  ALBUMIN 4.5   Recent Labs  Lab 04/23/17 0001  LIPASE 30   Recent Labs  Lab 04/23/17 0001  AMMONIA 34   Coagulation Profile: No results for input(s): INR, PROTIME in the last 168 hours. Cardiac Enzymes: No results for input(s): CKTOTAL, CKMB, CKMBINDEX, TROPONINI in the last 168 hours. BNP (last 3 results) No results for input(s): PROBNP in the last 8760 hours. HbA1C: No results for input(s): HGBA1C in the last 72 hours. CBG: No results for input(s): GLUCAP in the last 168 hours. Lipid Profile: No results for input(s): CHOL, HDL, LDLCALC, TRIG, CHOLHDL, LDLDIRECT in the last 72 hours. Thyroid Function Tests: No results for input(s): TSH, T4TOTAL, FREET4, T3FREE, THYROIDAB in the last 72 hours. Anemia Panel: No results for input(s): VITAMINB12, FOLATE, FERRITIN, TIBC, IRON, RETICCTPCT in the last 72 hours. Urine analysis:    Component Value Date/Time   COLORURINE  YELLOW 04/27/2015 1635   APPEARANCEUR CLEAR 04/27/2015 1635   LABSPEC 1.009 04/27/2015 1635   PHURINE 7.0 04/27/2015 1635   GLUCOSEU NEGATIVE 04/27/2015 1635   HGBUR NEGATIVE 04/27/2015 1635   BILIRUBINUR NEGATIVE 04/27/2015 1635   KETONESUR NEGATIVE 04/27/2015 1635   PROTEINUR NEGATIVE 04/27/2015 1635   UROBILINOGEN 1.0 05/15/2012 0640   NITRITE NEGATIVE 04/27/2015 1635   LEUKOCYTESUR NEGATIVE 04/27/2015 1635   Sepsis Labs: No results found for this or any previous visit (from the past 240 hour(s)).   Radiological Exams on Admission: Dg Chest 2 View  Result Date: 04/23/2017 CLINICAL DATA:  Diffuse edema EXAM: CHEST - 2 VIEW COMPARISON:  04/28/2015 FINDINGS: Low lung volumes with mild bibasilar atelectasis. Mild cardiomegaly with minimal central vascular congestion. No pleural effusion. No pneumothorax. IMPRESSION: 1.  Low lung volumes with minimal basilar atelectasis 2. Mild cardiomegaly with slight central vascular congestion Electronically Signed   By: Jasmine Pang M.D.   On: 04/23/2017 00:29   Ct Head Wo Contrast  Result Date: 04/23/2017 CLINICAL DATA:  Altered LOC EXAM: CT HEAD WITHOUT CONTRAST TECHNIQUE: Contiguous axial images were obtained from the base of the skull through the vertex without intravenous contrast. COMPARISON:  05/21/2012 FINDINGS: Brain: No acute territorial infarction, hemorrhage or intracranial mass. Normal ventricle size. Vascular: No hyperdense vessels.  No unexpected calcification Skull: Normal. Negative for fracture or focal lesion. Sinuses/Orbits: Mild mucosal thickening in the ethmoid and maxillary sinuses. Retention cysts in the right maxillary sinus. Other: Periorbital soft tissue swelling and soft tissue swelling over the forehead. IMPRESSION: 1. Negative non contrasted CT appearance of the brain 2. Periorbital and forehead soft tissue swelling Electronically Signed   By: Jasmine Pang M.D.   On: 04/23/2017 00:08    EKG: Independently reviewed.  Sinus bradycardia at 54 bpm with first-degree heart block and low voltage noted  Assessment/Plan Peripheral edema: Acute.  Patient presents with multiple month history of progressively worsening swelling.  Reports 50 pound weight gain.  3+ pitting edema noted of the lower extremities, hands, and face.  Chest x-ray showing cardiomegaly with some mild vascular congestion. Suspect hypothyroidism versus CHF versus other. - Admit to a telemetry bed - Heart failure orders set  initiated  - Continuous pulse oximetry with nasal cannula oxygen as needed to keep O2 saturations >92% - Strict I&Os and daily weights - Elevate lower extremities - Check thyroid studies - Will give Lasix if thyroid studies normal - Check echocardiogram - Determine if further workup needed  H/O Subclinical hypothyroidism: Review of records shows that patient last  had a TSH >90 back in 04/2015 - Checking TSH and free T4 - Will start on low-dose levothyroxine lab work shows hypothyroidism  Bradycardia with first-degree heart block: Patient's heart rates initially in the 40-50s on admission.  He is not on any rate controlling medications suspect secondary to above. - Follow-up telemetry overnight  Hyponatremia: Acute.  Initial sodium noted to be 125 on admission.  - Check urinalysis, urine osm  History of histoplasmosis  - Follow-up urine histoplasmosis  HIV: Patient reports taking his antiretroviral medications as prescribed.  Last CD4 % helper T-cell count 20 absolute and CD4 cell count 230 on 11/15/2016. - Continue Biktarvy - Continue outpatient follow-up  Elevated AST: Acute on chronic.  Patient presents with a AST of 164 with ALT 54.  Baseline previously noted to be around 120 during last check on 11/15/2016.  - Check GGT   H/O HFpEF: Patient's last EF was noted to be 65-70% on  04/29/2015. - Orders as seen above  Warts - Continue cream  Chronic kidney disease stage II: Patient creatinine appears slightly improved at 1.3 as baseline creatinine previously noted to be around 1.5-1.7. - Recheck creatinine in a.m.  DVT prophylas:lovenox  Code Status: Full  Family Communication: none Disposition Plan: TBD  Consults called: none  Admission status: inpatient   Clydie Braun MD [facility] Pager [phone]   If 7PM-7AM, please contact night-coverage [email or web address] Password [facility]  04/23/2017, 6:27 PM

## 2017-04-23 NOTE — Progress Notes (Signed)
Pt's HR goes down to 36 bpm. Pt resting, denies pain or any discomfort. Made MD aware. Will continue to monitor pt.

## 2017-04-23 NOTE — Progress Notes (Signed)
Subjective:    Patient ID: David Knapp, male    DOB: 1980/10/07, 37 y.o.   MRN: [id number]  Chief Complaint  Patient presents with  . Follow-up     HPI:  David Knapp is a 37 y.o. male with a previous medical history of HIV/AIDS, disseminated histoplasmosis, pancytopenia and CDK II who presents for follow up from ER visit. [name] speaks primarily Spanish and a medical translator is present via computer to interpret.   [name] has been experiencing the associated symptoms of progressively worsening edema that has been going on for several months and refractory to treatment with prednisone and furosemide. Was seen in the ED on 4/21 with accelerating symptoms. Severity is enough that he has possibly gained about 15 pounds over the last month and regular clothes are tight and difficult to get on. CT imaging of the head with normal appearance of the brain. Chest x-ray with low lung volumes and mild cardiomegaly. His last echocardiogram with ejection fraction of 65-70% and normal wall motion with no vegetation was completed in April 2017.   He has gained about 50 pounds over the past 2 months having increased in weight from his base weight of around 200. Severity of the symptoms is enough to cause pain when he walks and leave him short of breath at times. He is unable to lay flat for a long period of time. Described as feeling a tightness throughout. The previously prescribed prednisone did not help very much. He denies fevers, chills or night sweats.   Allergies  Allergen Reactions  . Sulfa Antibiotics Other (See Comments)    He believes he has experienced throat swelling with prezcobix and Descovy for nearly a year since April 2017 to present March 22, 2016      Outpatient Medications Prior to Visit  Medication Sig Dispense Refill  . acetaminophen (TYLENOL) 325 MG tablet Take 1 tablet (325 mg total) by mouth every 6 (six) hours as needed.    . bictegravir-emtricitabine-tenofovir AF  (BIKTARVY) 50-200-25 MG TABS tablet Take 1 tablet daily by mouth. 30 tablet 11  . hydrOXYzine (ATARAX/VISTARIL) 10 MG tablet Take 1 tablet (10 mg total) by mouth 3 (three) times daily as needed. 30 tablet 0  . imiquimod (ALDARA) 5 % cream Apply 3 (three) times a week topically. 12 each 2  . predniSONE (STERAPRED UNI-PAK 21 TAB) 10 MG (21) TBPK tablet 5 tabs on day 1, 4 tabs on day 2, 3 tabs on day 3 and 4, 2 tabs on day 5 and 6 and 7 21 tablet 0   No facility-administered medications prior to visit.      Past Medical History:  Diagnosis Date  . Allergic reaction 03/22/2016  . Change in voice 03/22/2016  . CKD (chronic kidney disease), stage II   . 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 Possible recurrence 2014 (currently being evaluated 05/2012)  . Edema 04/27/2015  . HIV (human immunodeficiency virus infection) (HCC)   . Pancytopenia (HCC)   . 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 Throat swelling 03/22/2016     Past Surgical History:  Procedure Laterality Date  . NO PAST SURGERIES         Review of Systems  Constitutional: Positive for unexpected weight change. Negative for chills, fatigue and fever.  Respiratory: Positive for chest tightness and shortness of breath (occcasional). Negative for cough and wheezing.   Cardiovascular: Positive for leg swelling. Negative for chest pain.  Gastrointestinal: Negative for abdominal pain, blood  in stool, constipation, diarrhea, nausea and vomiting.  Endocrine: Negative for cold intolerance and heat intolerance.      Objective:    BP 129/81   Pulse 69   Temp 98.8 F (37.1 C) (Oral)   Wt 266 lb (120.7 kg)   BMI 39.86 kg/m  Nursing note and vital signs reviewed.  Physical Exam  Constitutional: He appears well-developed. No distress.  Pleasant; seated in the chair;  Cardiovascular: Exam reveals no gallop and no friction rub.  No murmur heard. Significant non-pitting edema of bilateral lower  extremities with some generalized tenderness.  Pulmonary/Chest: Effort normal. No respiratory distress. He has no wheezes. He has rales. He exhibits no tenderness.       Assessment & Plan:   Problem List Items Addressed This Visit      Musculoskeletal and Integument   SKIN RASH    Skin rash with multiple verruca appearing lesions. Question relation to current situation. Was previously referred to dermatology. Will follow up on referral post-hospitalization.         Other   Generalized edema - Primary    [name] presents with generalized edema of undetermined cause with significant weight gain of 50 pounds within the past couple of months that has been refractory to previous treatments with prednisone. His HIV is well controlled with last viral load being undetectable. Renal function and BNP reviewed from ED were normal with the exception of sodium which was low likely related to fluid. Question re-emergence of histoplasmosis. Would recommend a histoplasmosis urine antigen. May need echocardiogram to check current heart failure status. Given his shortness of breath, weight gain, and adventitious lung sounds I recommended hospitalization. Spoke with hospitalists for direct admission.           I am having Eliane DecreeJavier Daleo maintain his acetaminophen, bictegravir-emtricitabine-tenofovir AF, imiquimod, predniSONE, and hydrOXYzine.    Follow-up:  Post-hospitalization  Marcos EkeGreg Nayan Proch, MSN, [facility]NP[facility]

## 2017-04-23 NOTE — Assessment & Plan Note (Signed)
David Knapp presents with generalized edema of undetermined cause with significant weight gain of 50 pounds within the past couple of months that has been refractory to previous treatments with prednisone. His HIV is well controlled with last viral load being undetectable. Renal function and BNP reviewed from ED were normal with the exception of sodium which was low likely related to fluid. Question re-emergence of histoplasmosis. Would recommend a histoplasmosis urine antigen. May need echocardiogram to check current heart failure status. Given his shortness of breath, weight gain, and adventitious lung sounds I recommended hospitalization. Spoke with hospitalists for direct admission.

## 2017-04-23 NOTE — Assessment & Plan Note (Signed)
Skin rash with multiple verruca appearing lesions. Question relation to current situation. Was previously referred to dermatology. Will follow up on referral post-hospitalization.

## 2017-04-23 NOTE — ED Provider Notes (Signed)
[facility] Provider Note   CSN: [id number] Arrival date & time: 04/22/17  2252     History   Chief Complaint Chief Complaint  Patient presents with  . swelling    HPI Eliane DecreeJavier Puccinelli is a 37 y.o. male.  HPI  37 year old male comes in with chief complaint of worsening swelling in his arms and legs.  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  Patient states that his swelling has been slowly getting worse over the past several months.  He has noted acceleration of the swelling over the last 2 weeks.  Patient has seen his ID doctor and was given some pills about 2 weeks ago which he has now run out of.  He does not think the pills helped.  With the swelling patient denies any chest pain, shortness of breath, new headaches, new neurologic symptoms such as numbness or tingling or vision changes.  Patient reports that he has difficulty putting on his regular clothes now.  He thinks that his weight has gone up by 15 pounds in the last 1 month.  Our notes show that patient has been seen by infectious disease team where they have addressed peripheral edema before.  In fact when patient was admitted to the hospital on 05/01/2015 he had peripheral edema, and had an echocardiogram that showed preserved EF and no evidence of low protein state.  Patient was given Lasix at that time.  More recently patient was seen in the clinic and he was started on prednisone taper.  Past Medical History:  Diagnosis Date  . Allergic reaction 03/22/2016  . Change in voice 03/22/2016  . CKD (chronic kidney disease), stage II   . 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 Possible recurrence 2014 (currently being evaluated 05/2012)  . Edema 04/27/2015  . HIV (human immunodeficiency virus infection) (HCC)   . Pancytopenia (HCC)   . 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 Throat swelling  03/22/2016    Patient Active Problem List   Diagnosis Date Noted  . Allergic reaction 03/22/2016  . Throat swelling 03/22/2016  . Change in voice 03/22/2016  . Acute kidney injury (HCC)   . Strep throat 04/28/2015  . Generalized edema   . CKD (chronic kidney disease), stage II   . Strep pharyngitis   . 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 Acute renal failure (HCC) 07/15/2014  . Orthostatic hypotension 05/20/2012  . Fever 05/15/2012  . AIDS (HCC) 05/15/2012  . SIRS (systemic inflammatory response syndrome) (HCC) 05/15/2012  . Viral warts 07/26/2007  . PSORIASIS 02/13/2007  . SKIN RASH 01/25/2007  . Pancytopenia (HCC) 08/01/2006  . SEPTIC SHOCK 08/01/2006  . PNEUMONIA, HX OF 07/31/2006  . 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 NO PAST SURGERIES          Home Medications    Prior to Admission medications   Medication Sig Start Date End Date Taking? Authorizing Provider  acetaminophen (TYLENOL) 325 MG tablet Take 1 tablet (325 mg total) by mouth every 6 (six) hours as needed. 05/23/12   Genelle GatherGlenn, Kathryn F, MD  bictegravir-emtricitabine-tenofovir AF [facility](BIKTARVY) [phone] MG TABS tablet Take 1 tablet daily by mouth. 11/15/16   Daiva EvesVan Dam, Lisette Grinderornelius N, MD  hydrOXYzine (ATARAX/VISTARIL) 10 MG tablet Take 1 tablet (10 mg total) by mouth 3 (three) times daily as needed. 03/05/17   Ginnie SmartHatcher, Jeffrey C, MD  imiquimod [facility](ALDARA) 5 %  cream Apply 3 (three) times a week topically. Patient not taking: Reported on 01/15/2017 11/15/16   Daiva Eves, Lisette Grinder, MD  predniSONE (STERAPRED UNI-PAK 21 TAB) 10 MG (21) TBPK tablet 5 tabs on day 1, 4 tabs on day 2, 3 tabs on day 3 and 4, 2 tabs on day 5 and 6 and 7 03/05/17   Ginnie Smart, MD    Family History Family History  Problem Relation Age of Onset  . Coronary artery disease Mother        died from MI @ age 73 yo    Social History Social History   Tobacco Use  . Smoking  status: Never Smoker  . Smokeless tobacco: Never Used  Substance Use Topics  . Alcohol use: No    Alcohol/week: 3.0 oz    Types: 6 drink(s) per week  . Drug use: No     Allergies   Sulfa antibiotics   Review of Systems Review of Systems  Constitutional: Positive for activity change.  Respiratory: Negative for shortness of breath.   Cardiovascular: Negative for chest pain.  Gastrointestinal: Negative for abdominal pain.  Genitourinary: Negative for dysuria.  Skin: Positive for rash.  Allergic/Immunologic: Positive for immunocompromised state.  Hematological: Does not bruise/bleed easily.  All other systems reviewed and are negative.    Physical Exam Updated Vital Signs BP 115/76 (BP Location: Right Wrist)   Pulse 69   Temp (!) 97.5 F (36.4 C) (Oral)   Resp 18   Wt 98.9 kg (218 lb)   SpO2 95%   BMI 32.66 kg/m   Physical Exam  Constitutional: He is oriented to person, place, and time. He appears well-developed.  HENT:  Head: Atraumatic.  Neck: Neck supple.  Cardiovascular: Normal rate.  Pulmonary/Chest: Effort normal. He has no rales.  Abdominal: Soft. There is no tenderness. There is no guarding.  Musculoskeletal: He exhibits edema.  Patient has generalized edema over the bilateral upper and lower extremities.  Mild pitting appreciated.  Patient's face also appears edematous.   Neurological: He is alert and oriented to person, place, and time.  Skin: Skin is warm. Rash noted.  Nursing note and vitals reviewed.        ED Treatments / Results  Labs (all labs ordered are listed, but only abnormal results are displayed) Labs Reviewed  CBC WITH DIFFERENTIAL/PLATELET - Abnormal; Notable for the following components:      Result Value   RBC 4.06 (*)    Hemoglobin 12.7 (*)    HCT 36.9 (*)    Lymphs Abs 0.6 (*)    All other components within normal limits  BASIC METABOLIC PANEL - Abnormal; Notable for the following components:   Sodium 125 (*)     Chloride 86 (*)    Glucose, Bld 101 (*)    Creatinine, Ser 1.30 (*)    Calcium 8.7 (*)    All other components within normal limits  HEPATIC FUNCTION PANEL - Abnormal; Notable for the following components:   AST 164 (*)    Indirect Bilirubin 1.1 (*)    All other components within normal limits  LIPASE, BLOOD  BRAIN NATRIURETIC PEPTIDE  AMMONIA  URINALYSIS, ROUTINE W REFLEX MICROSCOPIC  I-STAT CG4 LACTIC ACID, ED  I-STAT TROPONIN, ED  I-STAT CG4 LACTIC ACID, ED    EKG EKG Interpretation  Date/Time:  Sunday April 22 2017 23:21:35 EDT Ventricular Rate:  70 PR Interval:  182 QRS Duration: 94 QT Interval:  390 QTC Calculation: 421 R Axis:  154 Text Interpretation:  Undetermined rhythm Right axis deviation Abnormal ECG No acute changes Nonspecific ST and T wave abnormality Confirmed by Derwood Kaplan [phone]) on 04/23/2017 1:54:14 AM   Radiology Dg Chest 2 View  Result Date: 04/23/2017 CLINICAL DATA:  Diffuse edema EXAM: CHEST - 2 VIEW COMPARISON:  04/28/2015 FINDINGS: Low lung volumes with mild bibasilar atelectasis. Mild cardiomegaly with minimal central vascular congestion. No pleural effusion. No pneumothorax. IMPRESSION: 1. Low lung volumes with minimal basilar atelectasis 2. Mild cardiomegaly with slight central vascular congestion Electronically Signed   By: Jasmine Pang M.D.   On: 04/23/2017 00:29   Ct Head Wo Contrast  Result Date: 04/23/2017 CLINICAL DATA:  Altered LOC EXAM: CT HEAD WITHOUT CONTRAST TECHNIQUE: Contiguous axial images were obtained from the base of the skull through the vertex without intravenous contrast. COMPARISON:  05/21/2012 FINDINGS: Brain: No acute territorial infarction, hemorrhage or intracranial mass. Normal ventricle size. Vascular: No hyperdense vessels.  No unexpected calcification Skull: Normal. Negative for fracture or focal lesion. Sinuses/Orbits: Mild mucosal thickening in the ethmoid and maxillary sinuses. Retention cysts in the right  maxillary sinus. Other: Periorbital soft tissue swelling and soft tissue swelling over the forehead. IMPRESSION: 1. Negative non contrasted CT appearance of the brain 2. Periorbital and forehead soft tissue swelling Electronically Signed   By: Jasmine Pang M.D.   On: 04/23/2017 00:08    Procedures Procedures (including critical care time)  Medications Ordered in ED Medications - No data to display   Initial Impression / Assessment and Plan / ED Course  I have reviewed the triage vital signs and the nursing notes.  Pertinent labs & imaging results that were available during my care of the patient were reviewed by me and considered in my medical decision making (see chart for details).     37 year old comes in with chief complaint of peripheral edema.  Patient is noted to be somnolent.  Patient has history of HIV AIDS and has histoplasmosis for which she was treated with amphotericin.  Patient states that his somnolence is not new.  His leg swelling is also not new, but it is getting worse.  I reviewed patient's medication and this could be potentially result of his medications.  In addition patient had his albumin and urinalysis checked and it does not seem like he is in a low protein state.  At this time I do not see any emergent process that needs to be looked into.  I have emailed the ID team to see if they can see the patient quickly.  In the recent past patient has been treated with Lasix and prednisone taper for his edema.  We will not initiate any treatment at this time given neither of those therapies have had significant effects.  Final Clinical Impressions(s) / ED Diagnoses   Final diagnoses:  Peripheral edema    ED Discharge Orders    None       Derwood Kaplan, MD 04/24/17 2308

## 2017-04-23 NOTE — Patient Instructions (Signed)
Nice to meet.  Bed control will call you with a bed to go to.

## 2017-04-23 NOTE — Discharge Instructions (Signed)
We saw you in the ER for the swelling. All the results in the ER are normal, labs and imaging. We are not sure what is causing your symptoms. The workup in the ER is not complete, and is limited to screening for life threatening and emergent conditions only, so please see your infectious disease doctor as soon as possible.

## 2017-04-24 ENCOUNTER — Inpatient Hospital Stay (HOSPITAL_COMMUNITY): Payer: Self-pay

## 2017-04-24 ENCOUNTER — Encounter (HOSPITAL_COMMUNITY): Payer: Self-pay | Admitting: Internal Medicine

## 2017-04-24 DIAGNOSIS — I509 Heart failure, unspecified: Secondary | ICD-10-CM

## 2017-04-24 DIAGNOSIS — E871 Hypo-osmolality and hyponatremia: Secondary | ICD-10-CM | POA: Diagnosis present

## 2017-04-24 DIAGNOSIS — Z21 Asymptomatic human immunodeficiency virus [HIV] infection status: Secondary | ICD-10-CM

## 2017-04-24 DIAGNOSIS — N183 Chronic kidney disease, stage 3 unspecified: Secondary | ICD-10-CM | POA: Diagnosis present

## 2017-04-24 DIAGNOSIS — E039 Hypothyroidism, unspecified: Secondary | ICD-10-CM

## 2017-04-24 DIAGNOSIS — R011 Cardiac murmur, unspecified: Secondary | ICD-10-CM

## 2017-04-24 DIAGNOSIS — K5900 Constipation, unspecified: Secondary | ICD-10-CM

## 2017-04-24 DIAGNOSIS — R498 Other voice and resonance disorders: Secondary | ICD-10-CM

## 2017-04-24 DIAGNOSIS — R609 Edema, unspecified: Secondary | ICD-10-CM | POA: Diagnosis present

## 2017-04-24 DIAGNOSIS — Z882 Allergy status to sulfonamides status: Secondary | ICD-10-CM

## 2017-04-24 HISTORY — DX: Hypothyroidism, unspecified: E03.9

## 2017-04-24 LAB — CBC WITH DIFFERENTIAL/PLATELET
BASOS ABS: 0 10*3/uL (ref 0.0–0.1)
BASOS PCT: 1 %
Eosinophils Absolute: 0.3 10*3/uL (ref 0.0–0.7)
Eosinophils Relative: 8 %
HEMATOCRIT: 36.6 % — AB (ref 39.0–52.0)
HEMOGLOBIN: 12.3 g/dL — AB (ref 13.0–17.0)
LYMPHS PCT: 15 %
Lymphs Abs: 0.6 10*3/uL — ABNORMAL LOW (ref 0.7–4.0)
MCH: 30.5 pg (ref 26.0–34.0)
MCHC: 33.6 g/dL (ref 30.0–36.0)
MCV: 90.8 fL (ref 78.0–100.0)
MONO ABS: 0.2 10*3/uL (ref 0.1–1.0)
Monocytes Relative: 6 %
NEUTROS ABS: 3 10*3/uL (ref 1.7–7.7)
NEUTROS PCT: 72 %
Platelets: 187 10*3/uL (ref 150–400)
RBC: 4.03 MIL/uL — AB (ref 4.22–5.81)
RDW: 14.1 % (ref 11.5–15.5)
WBC: 4.2 10*3/uL (ref 4.0–10.5)

## 2017-04-24 LAB — COMPREHENSIVE METABOLIC PANEL
ALBUMIN: 4 g/dL (ref 3.5–5.0)
ALK PHOS: 50 U/L (ref 38–126)
ALT: 50 U/L (ref 17–63)
AST: 171 U/L — AB (ref 15–41)
Anion gap: 9 (ref 5–15)
BILIRUBIN TOTAL: 1.2 mg/dL (ref 0.3–1.2)
BUN: 7 mg/dL (ref 6–20)
CO2: 29 mmol/L (ref 22–32)
CREATININE: 1.07 mg/dL (ref 0.61–1.24)
Calcium: 8.6 mg/dL — ABNORMAL LOW (ref 8.9–10.3)
Chloride: 84 mmol/L — ABNORMAL LOW (ref 101–111)
GFR calc Af Amer: >60 mL/min (ref >60)
Glucose, Bld: 99 mg/dL (ref 65–99)
POTASSIUM: 3.7 mmol/L (ref 3.5–5.1)
Sodium: 122 mmol/L — ABNORMAL LOW (ref 135–145)
TOTAL PROTEIN: 7.2 g/dL (ref 6.5–8.1)

## 2017-04-24 LAB — BASIC METABOLIC PANEL WITH GFR
Anion gap: 9 (ref 5–15)
BUN: 7 mg/dL (ref 6–20)
CO2: 30 mmol/L (ref 22–32)
Calcium: 9 mg/dL (ref 8.9–10.3)
Chloride: 82 mmol/L — ABNORMAL LOW (ref 101–111)
Creatinine, Ser: 1.27 mg/dL — ABNORMAL HIGH (ref 0.61–1.24)
GFR calc Af Amer: >60 mL/min
GFR calc non Af Amer: >60 mL/min
Glucose, Bld: 99 mg/dL (ref 65–99)
Potassium: 3.8 mmol/L (ref 3.5–5.1)
Sodium: 121 mmol/L — ABNORMAL LOW (ref 135–145)

## 2017-04-24 LAB — BASIC METABOLIC PANEL
Anion gap: 10 (ref 5–15)
BUN: 6 mg/dL (ref 6–20)
CALCIUM: 8.6 mg/dL — AB (ref 8.9–10.3)
CHLORIDE: 83 mmol/L — AB (ref 101–111)
CO2: 27 mmol/L (ref 22–32)
CREATININE: 0.99 mg/dL (ref 0.61–1.24)
GFR calc non Af Amer: >60 mL/min (ref >60)
GLUCOSE: 97 mg/dL (ref 65–99)
Potassium: 3.8 mmol/L (ref 3.5–5.1)
Sodium: 120 mmol/L — ABNORMAL LOW (ref 135–145)

## 2017-04-24 LAB — OSMOLALITY, URINE: OSMOLALITY UR: 568 mosm/kg (ref 300–900)

## 2017-04-24 LAB — HISTOPLASMA ANTIGEN, URINE

## 2017-04-24 LAB — NA AND K (SODIUM & POTASSIUM), RAND UR
POTASSIUM UR: 33 mmol/L
Sodium, Ur: 146 mmol/L

## 2017-04-24 LAB — ECHOCARDIOGRAM COMPLETE: WEIGHTICAEL: 4194.03 [oz_av]

## 2017-04-24 MED ORDER — HYDROCORTISONE NA SUCCINATE PF 100 MG IJ SOLR
50.0000 mg | Freq: Three times a day (TID) | INTRAMUSCULAR | Status: DC
  Administered 2017-04-24 – 2017-04-26 (×7): 50 mg via INTRAVENOUS
  Filled 2017-04-24 (×8): qty 2

## 2017-04-24 MED ORDER — HYDROCORTISONE NA SUCCINATE PF 100 MG IJ SOLR
50.0000 mg | INTRAMUSCULAR | Status: AC
  Administered 2017-04-24: 50 mg via INTRAVENOUS

## 2017-04-24 MED ORDER — LEVOTHYROXINE SODIUM 100 MCG IV SOLR
200.0000 ug | Freq: Once | INTRAVENOUS | Status: AC
  Administered 2017-04-24: 200 ug via INTRAVENOUS
  Filled 2017-04-24: qty 10

## 2017-04-24 MED ORDER — LEVOTHYROXINE SODIUM 50 MCG PO TABS
50.0000 ug | ORAL_TABLET | Freq: Every day | ORAL | Status: DC
  Administered 2017-04-24: 50 ug via ORAL
  Filled 2017-04-24: qty 1

## 2017-04-24 MED ORDER — SODIUM CHLORIDE 1 G PO TABS
2.0000 g | ORAL_TABLET | Freq: Two times a day (BID) | ORAL | Status: DC
  Administered 2017-04-24: 2 g via ORAL
  Filled 2017-04-24: qty 2

## 2017-04-24 MED ORDER — LEVOTHYROXINE SODIUM 100 MCG IV SOLR
100.0000 ug | Freq: Every day | INTRAVENOUS | Status: DC
  Administered 2017-04-25 – 2017-04-27 (×3): 100 ug via INTRAVENOUS
  Filled 2017-04-24 (×3): qty 5

## 2017-04-24 MED ORDER — FUROSEMIDE 10 MG/ML IJ SOLN
20.0000 mg | Freq: Once | INTRAMUSCULAR | Status: AC
  Administered 2017-04-24: 20 mg via INTRAVENOUS
  Filled 2017-04-24: qty 2

## 2017-04-24 NOTE — Progress Notes (Signed)
  Echocardiogram 2D Echocardiogram has been performed.  David Knapp, [name] R 04/24/2017, 11:02 AM

## 2017-04-24 NOTE — Consult Note (Signed)
[facility]    Date of Admission:  04/23/2017     Total days of antibiotics 0               Reason for Consult: HIV infection     Referring Provider: Elgergawy Primary Care Provider: Patient, No Pcp Per   Assessment: 37 y.o. Hispanic male with HIV infection well controlled (11/2016 VL < 20 and CD4 240) on Biktarvy and reports continued good adherence since we last check viral load. UMAP approved and has access to medications. He is here due to 50 lb weight gain and worsening of swelling including face/hands. TSH found to be > 90. Swelling c/w myxedema from untreated hypothyroidism - also having bradycardic episodes. Appreciate Dr. Teena IraniElgergawy's and Dr. Daune PerchKerr's help for management.  He will follow up with ID in a few weeks.   Plan: 1. Continue Biktarvy  2. Check HIV VL and CD4 tomorrow AM 3. Out[facility] as previously instructed   Available as needed - thank you.  Principal Problem:   Peripheral edema Active Problems:   HIV (human immunodeficiency virus infection) (HCC)   Hypothyroidism   Hyponatremia   CKD (chronic kidney disease), stage III (HCC)   . bictegravir-emtricitabine-tenofovir AF  1 tablet Oral Daily  . enoxaparin (LOVENOX) injection  40 mg Subcutaneous Q24H  . hydrocortisone sod succinate (SOLU-CORTEF) inj  50 mg Intravenous Q8H  . imiquimod   Topical Once per day on Mon Wed Fri  . [START ON 04/25/2017] levothyroxine  100 mcg Intravenous Daily  . levothyroxine  200 mcg Intravenous Once  . sodium chloride flush  3 mL Intravenous Q12H    HPI: David Knapp is a 37 y.o. male from [place] with HIV infection. Spanish phone interpreter was used. Initially treated with Prezcobix/Descovy and [facility] once he achieved suppression and has been suppressed since with CD4 count ~ 250. He does have a history of disseminated histoplasmosis with CNS involvement s/p secondary prophylaxis with  itraconazole for nearly 2 years after induction therapy. He saw Dr. Ninetta LightsHatcher in March of 2019 with increased swelling of his hands and face as well.   Presently he has been admitted for worsened edema involving his face. TSH discovered to be > 90. He is feeling "OK" since admitted. Mostly tired.   Review of Systems: Review of Systems  Constitutional: Positive for malaise/fatigue. Negative for chills, fever and weight loss.  HENT: Negative for sore throat.        Facial swelling   Respiratory: Negative for cough and sputum production.   Cardiovascular: Positive for leg swelling (generalizd swelling). Negative for chest pain.  Gastrointestinal: Positive for constipation. Negative for abdominal pain, diarrhea and vomiting.  Genitourinary: Negative for dysuria and flank pain.  Musculoskeletal: Negative for joint pain, myalgias and neck pain.  Skin: Negative for rash.  Neurological: Positive for speech change (deep voice ). Negative for dizziness, tingling and headaches.  Psychiatric/Behavioral: Negative for depression and substance abuse. The patient is not nervous/anxious and does not have insomnia.     Past Medical History:  Diagnosis Date  . Allergic reaction 03/22/2016  . Change in voice 03/22/2016  . CHF (congestive heart failure) (HCC) 04/23/2017  . CKD (chronic kidney disease), stage II   . 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 Possible recurrence 2014 (currently being evaluated 05/2012)  . Edema 04/27/2015  . HIV (human immunodeficiency virus infection) (HCC)   .  Hypothyroidism 04/24/2017  . Pancytopenia (HCC)   . 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 Throat swelling 03/22/2016    Social History   Tobacco Use  . Smoking status: Never Smoker  . Smokeless tobacco: Never Used  Substance Use Topics  . Alcohol use: No    Alcohol/week: 3.0 oz    Types: 6 drink(s) per week  . Drug use: No    Family History  Problem Relation Age of Onset  .  Coronary artery disease Mother        died from MI @ age 60 yo   Allergies  Allergen Reactions  . Sulfa Antibiotics Other (See Comments)    He believes he has experienced throat swelling with prezcobix and Descovy for nearly a year since April 2017 to present March 22, 2016    OBJECTIVE: Blood pressure (!) 100/51, pulse 66, temperature 97.7 F (36.5 C), temperature source Oral, resp. rate 14, weight 262 lb 2 oz (118.9 kg), SpO2 100 %.  Physical Exam  Constitutional: He is oriented to person, place, and time. He appears well-developed and well-nourished.  Resting in bed. Falls asleep easily.   HENT:  Mouth/Throat: Oropharynx is clear and moist and mucous membranes are normal. Normal dentition. No dental abscesses.  Swelling to eyes/face/lips.   Eyes: EOM are normal. No scleral icterus.  Neck: No JVD present.  Cardiovascular: Regular rhythm and normal heart sounds. Bradycardia present.  Pulmonary/Chest: Effort normal and breath sounds normal.  Abdominal: Soft. He exhibits no distension. There is no tenderness.  Musculoskeletal: He exhibits edema.  Lymphadenopathy:    He has no cervical adenopathy.  Neurological: He is alert and oriented to person, place, and time.  Skin: Skin is warm and dry. No rash noted.  Psychiatric: He has a normal mood and affect. Judgment normal.    Lab Results Lab Results  Component Value Date   WBC 4.2 04/24/2017   HGB 12.3 (L) 04/24/2017   HCT 36.6 (L) 04/24/2017   MCV 90.8 04/24/2017   PLT 187 04/24/2017    Lab Results  Component Value Date   CREATININE 1.07 04/24/2017   BUN 7 04/24/2017   NA 122 (L) 04/24/2017   K 3.7 04/24/2017   CL 84 (L) 04/24/2017   CO2 29 04/24/2017    Lab Results  Component Value Date   ALT 50 04/24/2017   AST 171 (H) 04/24/2017   ALKPHOS 50 04/24/2017   BILITOT 1.2 04/24/2017    HIV 1 RNA Quant (copies/mL)  Date Value  11/15/2016 <20 NOT DETECTED  09/25/2016 <20 DETECTED  04/19/2016 <20 NOT DETECTED    CD4 T Cell Abs (/uL)  Date Value  11/15/2016 230 (L)  09/25/2016 270 (L)  03/22/2016 240 (L)    Microbiology: No results found for this or any previous visit (from the past 240 hour(s)).  Rexene Alberts, MSN, NP-C [facility] Cell: [phone] Pager: [phone]  04/24/2017 1:25 PM

## 2017-04-24 NOTE — Progress Notes (Addendum)
PROGRESS NOTE                                                                                                                                                                                                             Patient Demographics:    David Knapp, is a 37 y.o. male, DOB - 01-23-1980, ZOX:[id number]  Admit date - 04/23/2017   Admitting Physician Clydie Braun, MD  Outpatient Primary MD for the patient is Patient, No Pcp Per  LOS - 1  No chief complaint on file.      Brief Narrative    37 y.o. male with medical history significant of HIV, CKD stage II, HFpEF, histoplasmosis, and pancytopenia; who presents with complaints of progressive swelling over the last several months.  Patient was sent as a direct admission from ID clinic, giving abnormal labs including hyponatremia, and progressive swelling no response to steroids and diuresis, by reviewing workup, she had abnormal TSH hormone couple years ago, not on any Synthroid supplement, and was noted to have significant fluid retention, bradycardia, hyponatremia, workup significant for low free T4, and significantly elevated TSH.    Subjective:    David Knapp today has, No headache, No chest pain, No abdominal pain - No Nausea,    Assessment  & Plan :    Principal Problem:   Peripheral edema Active Problems:   AIDS (HCC)   Hypothyroidism   Hyponatremia   CKD (chronic kidney disease), stage III (HCC)   Thyroidism/myxedema -Patient physical exam and labs significant for myxedema, he is sleepy, but wakes up and responds appropriately, TSH significantly elevated at 96, free T4 <0.25, discussed at length with endocrinology Dr.Kerr, who will evaluate patient later today, but for now there is  indication for IV Synthroid, will give 200 mcg IV as a loading dose, and from tomorrow continue with Synthroid 100 mcg IV, patient has been on prednisone, so cortisol level would not be appropriate, so for  now recommendation is to continue empirically with the cortisone at 50 mg IV every 8 hours until insufficiency is ruled out. -Peripheral edema is likely related to his myxedema coma, for now no indication for diuresis, follow on 2D echo.  Bradycardia with first-degree heart block: -  Patient's heart rates initially in the 40-50s on admission.    He is not not on any heart  rate controlling agent, this is most likely related to his myxedema, currently heart rate in the 50s-60s, Continue to monitor on telemetry .  Hyponatremia -Most likely in the setting of increased free water excretion secondary to myxedema, urine  sodium of 126, osmotic 699, will repeat BMP, have requested renal input. Addendum: Noted sodium has dropped further down to 120, have discussed with nephrology, at this point still no indication for hypertonic saline given patient with acute changes in mental status, accommodation is to repeat another BMP at 8 PM, he was already started on salt tablets and Lasix by renal.  History of histoplasmosis  - Follow-up urine histoplasmosis  HIV:  - Patient reports taking his antiretroviral medications as prescribed.  Last CD4 % helper T-cell count 20 absolute and CD4 cell count 230 on 11/15/2016. - Continue Biktarvy - Continue outpatient follow-up  Elevated AST:  - Acute on chronic.  Patient presents with a AST of 164 with ALT 54.  Baseline previously noted to be around 120 during last check on 11/15/2016.  - Check GGT   H/O HFpEF:  - Patient's last EF was noted to be 65-70% on 04/29/2015. - Following repeat 2D echo  Warts - Continue cream  Chronic kidney disease stage II:  - Patient creatinine appears slightly improved at 1.3 as baseline creatinine previously noted to be around 1.5-1.7.      Code Status : Full  Family Communication  : None at bedside  Disposition Plan  : pending further workup.  Consults  :  Endocrinology,renal  Procedures  : none  DVT Prophylaxis   :  Lovenox   Lab Results  Component Value Date   PLT 187 04/24/2017    Antibiotics  :    Anti-infectives (From admission, onward)   Start     Dose/Rate Route Frequency Ordered Stop   04/24/17 1000  bictegravir-emtricitabine-tenofovir AF (BIKTARVY) 50-200-25 MG per tablet 1 tablet     1 tablet Oral Daily 04/23/17 1835          Objective:   Vitals:   04/23/17 1916 04/23/17 2139 04/24/17 0500 04/24/17 0517  BP: 103/63 110/66  124/74  Pulse: 61 60  66  Resp: 16   16  Temp: 98.6 F (37 C) (!) 97.5 F (36.4 C)  97.7 F (36.5 C)  TempSrc: Oral Oral  Oral  SpO2: 94% 91%  95%  Weight:   118.9 kg (262 lb 2 oz)     Wt Readings from Last 3 Encounters:  04/24/17 118.9 kg (262 lb 2 oz)  04/23/17 120.7 kg (266 lb)  04/22/17 98.9 kg (218 lb)     Intake/Output Summary (Last 24 hours) at 04/24/2017 1213 Last data filed at 04/24/2017 0531 Gross per 24 hour  Intake 123 ml  Output 200 ml  Net -77 ml     Physical Exam  Sleeping comfortably, but wakes up and answer question appropriately . Exam typical for myxedema face including swollen face, with periorbital swelling, enlarged and macroglossia , and thinning hair . Supple Neck,No JVD, No cervical lymphadenopathy appriciated.  Symmetrical Chest wall movement, Good air movement bilaterally, CTAB RRR,No Gallops,Rubs or new Murmurs, No Parasternal Heave +ve B.Sounds, Abd Soft, No tenderness,No rebound - guarding or rigidity. No Cyanosis, Clubbing , has nonpitting edema ,no new Rash or bruise , has warts on his fingers.    Data Review:    CBC Recent Labs  Lab 04/23/17 0001 04/24/17 0657  WBC 4.7 4.2  HGB 12.7* 12.3*  HCT 36.9* 36.6*  PLT 185 187  MCV 90.9 90.8  MCH 31.3 30.5  MCHC 34.4 33.6  RDW 14.5 14.1  LYMPHSABS 0.6* 0.6*  MONOABS 0.1 0.2  EOSABS 0.2 0.3  BASOSABS 0.0 0.0    Chemistries  Recent Labs  Lab 04/23/17 0001 04/24/17 0657  NA 125* 122*  K 4.4 3.7  CL 86* 84*  CO2 28 29  GLUCOSE 101* 99    BUN 12 7  CREATININE 1.30* 1.07  CALCIUM 8.7* 8.6*  AST 164* 171*  ALT 54 50  ALKPHOS 58 50  BILITOT 1.2 1.2   ------------------------------------------------------------------------------------------------------------------ No results for input(s): CHOL, HDL, LDLCALC, TRIG, CHOLHDL, LDLDIRECT in the last 72 hours.  Lab Results  Component Value Date   HGBA1C 6.0 (H) 04/28/2015   ------------------------------------------------------------------------------------------------------------------ Recent Labs    04/23/17 2029  TSH 96.129*   ------------------------------------------------------------------------------------------------------------------ No results for input(s): VITAMINB12, FOLATE, FERRITIN, TIBC, IRON, RETICCTPCT in the last 72 hours.  Coagulation profile No results for input(s): INR, PROTIME in the last 168 hours.  No results for input(s): DDIMER in the last 72 hours.  Cardiac Enzymes Recent Labs  Lab 04/23/17 2029  TROPONINI <0.03   ------------------------------------------------------------------------------------------------------------------    Component Value Date/Time   BNP 38.7 04/23/2017 0001    Inpatient Medications  Scheduled Meds: . bictegravir-emtricitabine-tenofovir AF  1 tablet Oral Daily  . enoxaparin (LOVENOX) injection  40 mg Subcutaneous Q24H  . hydrocortisone sod succinate (SOLU-CORTEF) inj  50 mg Intravenous Q8H  . imiquimod   Topical Once per day on Mon Wed Fri  . [START ON 04/25/2017] levothyroxine  100 mcg Intravenous Daily  . levothyroxine  200 mcg Intravenous Once  . sodium chloride flush  3 mL Intravenous Q12H   Continuous Infusions: . sodium chloride     PRN Meds:.sodium chloride, acetaminophen, hydrOXYzine, ondansetron (ZOFRAN) IV, sodium chloride flush  Micro Results No results found for this or any previous visit (from the past 240 hour(s)).  Radiology Reports Dg Chest 2 View  Result Date: 04/23/2017 CLINICAL  DATA:  Diffuse edema EXAM: CHEST - 2 VIEW COMPARISON:  04/28/2015 FINDINGS: Low lung volumes with mild bibasilar atelectasis. Mild cardiomegaly with minimal central vascular congestion. No pleural effusion. No pneumothorax. IMPRESSION: 1. Low lung volumes with minimal basilar atelectasis 2. Mild cardiomegaly with slight central vascular congestion Electronically Signed   By: Jasmine Pang M.D.   On: 04/23/2017 00:29   Ct Head Wo Contrast  Result Date: 04/23/2017 CLINICAL DATA:  Altered LOC EXAM: CT HEAD WITHOUT CONTRAST TECHNIQUE: Contiguous axial images were obtained from the base of the skull through the vertex without intravenous contrast. COMPARISON:  05/21/2012 FINDINGS: Brain: No acute territorial infarction, hemorrhage or intracranial mass. Normal ventricle size. Vascular: No hyperdense vessels.  No unexpected calcification Skull: Normal. Negative for fracture or focal lesion. Sinuses/Orbits: Mild mucosal thickening in the ethmoid and maxillary sinuses. Retention cysts in the right maxillary sinus. Other: Periorbital soft tissue swelling and soft tissue swelling over the forehead. IMPRESSION: 1. Negative non contrasted CT appearance of the brain 2. Periorbital and forehead soft tissue swelling Electronically Signed   By: Jasmine Pang M.D.   On: 04/23/2017 00:08    Time Spent in minutes  35 minutes   Huey Bienenstock M.D on 04/24/2017 at 12:13 PM  Between 7am to 7pm - Pager - [phone]  After 7pm go to [email or web address] - password [facility]  [facility] -  Office  [phone]

## 2017-04-24 NOTE — Consult Note (Signed)
Reason for Consult: possible myxedema coma Referring Physician: [facility]  David Knapp is an 37 y.o. male.  HPI: For a few months, David Knapp has experienced progressively worse edema in his legs with associated swelling of his face--especially over the past month.  In regards to possible precipitating or contributing factors, severe hypothyroidism was discovered.  He is not aware of any thyroid disorders in his family.  He does not recall any thyroid diagnosis for himself prior to this admission.  He does not recall any thyroid hormone or thyroid treatment prior to this hospital stay.    Past Medical History:  Diagnosis Date  . Allergic reaction 03/22/2016  . Change in voice 03/22/2016  . CHF (congestive heart failure) ([place]) 04/23/2017  . CKD (chronic kidney disease), stage II   . 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 Possible recurrence 2014 (currently being evaluated 05/2012)  . Edema 04/27/2015  . HIV (human immunodeficiency virus infection) ([place])   . Hypothyroidism 04/24/2017  . Pancytopenia ([place])   . 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 Throat swelling 03/22/2016    Past Surgical History:  Procedure Laterality Date  . NO PAST SURGERIES      Family History  Problem Relation Age of Onset  . Coronary artery disease Mother        died from MI @ age 53 yo    Social History:  reports that he has never smoked. He has never used smokeless tobacco. He reports that he does not drink alcohol or use drugs.  Allergies:  Allergies  Allergen Reactions  . Sulfa Antibiotics Other (See Comments)    He believes he has experienced throat swelling with prezcobix and Descovy for nearly a year since April 2017 to present March 22, 2016    Review of systems: (somewhat limited by his altered voice, somnolence, and reliance on Ship [employer]) General: Weight gain Ear/nose/mouth/throat:  Hoarseness, Gradual enlargement of tongue Neuro:   Drowsiness. Respiratory: Snoring.  Medications: I have reviewed the patient's current medications.  Results for orders placed or performed during the hospital encounter of 04/23/17 (from the past 48 hour(s))  Osmolality, urine     Status: None   Collection Time: 04/23/17  7:36 PM  Result Value Ref Range   Osmolality, Ur 699 300 - 900 mOsm/kg    Comment: Performed at [facility] [address] [place], [place] [address]  Histoplasma antigen, urine     Status: None   Collection Time: 04/23/17  7:37 PM  Result Value Ref Range   Histoplasma Antigen, urine <0.5 <0.5 ng/mL   Disclaimer: Comment     Comment: (NOTE) This test was developed and its performance characteristics determined by [facility]. It has not been cleared or approved by the Food and Drug Administration. Performed At: [facility] [place], [place] [id number] Rush Farmer MD [id number] Performed at [facility], [place] [address], [place], [place] [address]   Urinalysis, Routine w reflex microscopic     Status: None   Collection Time: 04/23/17  7:38 PM  Result Value Ref Range   Color, Urine YELLOW YELLOW   APPearance CLEAR CLEAR   Specific Gravity, Urine 1.018 1.005 - 1.030   pH 7.0 5.0 - 8.0   Glucose, UA NEGATIVE NEGATIVE mg/dL   Hgb urine dipstick NEGATIVE NEGATIVE   Bilirubin Urine NEGATIVE NEGATIVE   Ketones, ur NEGATIVE NEGATIVE mg/dL   Protein, ur NEGATIVE NEGATIVE mg/dL   Nitrite NEGATIVE NEGATIVE   Leukocytes,  UA NEGATIVE NEGATIVE    Comment: Performed at [facility], [place] [address] [place], [place] [address]  Sodium, urine, random     Status: None   Collection Time: 04/23/17  7:39 PM  Result Value Ref Range   Sodium, Ur 126 mmol/L    Comment: Performed at [facility] [address] [place], [place] [address]  TSH     Status: Abnormal   Collection Time: 04/23/17  8:29 PM  Result Value Ref Range   TSH 96.129 (H) 0.350 - 4.500 uIU/mL    Comment:  Performed by a 3rd Generation assay with a functional sensitivity of <=0.01 uIU/mL. Performed at [facility], [place] [address] [place], [place] [address]   Osmolality     Status: Abnormal   Collection Time: 04/23/17  8:29 PM  Result Value Ref Range   Osmolality 261 (L) 275 - 295 mOsm/kg    Comment: Performed at [facility], [place] [address], [place], [place] [address]  T4, free     Status: Abnormal   Collection Time: 04/23/17  8:29 PM  Result Value Ref Range   Free T4 <0.25 (L) 0.61 - 1.12 ng/dL    Comment: Performed at [facility] [address] [place], [place] [address]  Troponin I     Status: None   Collection Time: 04/23/17  8:29 PM  Result Value Ref Range   Troponin I <0.03 <0.03 ng/mL    Comment: Performed at [facility] [address] [place], [place] [address]  Gamma GT     Status: None   Collection Time: 04/23/17  8:29 PM  Result Value Ref Range   GGT 16 7 - 50 U/L    Comment: Performed at [facility], [place] [address], [place], [place] [address]  CBC with Differential/Platelet     Status: Abnormal   Collection Time: 04/24/17  6:57 AM  Result Value Ref Range   WBC 4.2 4.0 - 10.5 K/uL   RBC 4.03 (L) 4.22 - 5.81 MIL/uL   Hemoglobin 12.3 (L) 13.0 - 17.0 g/dL   HCT 36.6 (L) 39.0 - 52.0 %   MCV 90.8 78.0 - 100.0 fL   MCH 30.5 26.0 - 34.0 pg   MCHC 33.6 30.0 - 36.0 g/dL   RDW 14.1 11.5 - 15.5 %   Platelets 187 150 - 400 K/uL   Neutrophils Relative % 72 %   Neutro Abs 3.0 1.7 - 7.7 K/uL   Lymphocytes Relative 15 %   Lymphs Abs 0.6 (L) 0.7 - 4.0 K/uL   Monocytes Relative 6 %   Monocytes Absolute 0.2 0.1 - 1.0 K/uL   Eosinophils Relative 8 %   Eosinophils Absolute 0.3 0.0 - 0.7 K/uL   Basophils Relative 1 %   Basophils Absolute 0.0 0.0 - 0.1 K/uL    Comment: Performed at [facility] [address] [place], [place] [address]  Comprehensive metabolic panel     Status: Abnormal   Collection Time: 04/24/17  6:57 AM  Result Value  Ref Range   Sodium 122 (L) 135 - 145 mmol/L   Potassium 3.7 3.5 - 5.1 mmol/L   Chloride 84 (L) 101 - 111 mmol/L   CO2 29 22 - 32 mmol/L   Glucose, Bld 99 65 - 99 mg/dL   BUN 7 6 - 20 mg/dL   Creatinine, Ser 1.07 0.61 - 1.24 mg/dL   Calcium 8.6 (L) 8.9 -  10.3 mg/dL   Total Protein 7.2 6.5 - 8.1 g/dL   Albumin 4.0 3.5 - 5.0 g/dL   AST 171 (H) 15 - 41 U/L   ALT 50 17 - 63 U/L   Alkaline Phosphatase 50 38 - 126 U/L   Total Bilirubin 1.2 0.3 - 1.2 mg/dL   GFR calc non Af Amer >60 >60 mL/min   GFR calc Af Amer >60 >60 mL/min    Comment: (NOTE) The eGFR has been calculated using the CKD EPI equation. This calculation has not been validated in all clinical situations. eGFR's persistently <60 mL/min signify possible Chronic Kidney Disease.    Anion gap 9 5 - 15    Comment: Performed at [facility] [address], [place], [place] [address]  Basic metabolic panel     Status: Abnormal   Collection Time: 04/24/17  1:07 PM  Result Value Ref Range   Sodium 120 (L) 135 - 145 mmol/L   Potassium 3.8 3.5 - 5.1 mmol/L   Chloride 83 (L) 101 - 111 mmol/L   CO2 27 22 - 32 mmol/L   Glucose, Bld 97 65 - 99 mg/dL   BUN 6 6 - 20 mg/dL   Creatinine, Ser 0.99 0.61 - 1.24 mg/dL   Calcium 8.6 (L) 8.9 - 10.3 mg/dL   GFR calc non Af Amer >60 >60 mL/min   GFR calc Af Amer >60 >60 mL/min    Comment: (NOTE) The eGFR has been calculated using the CKD EPI equation. This calculation has not been validated in all clinical situations. eGFR's persistently <60 mL/min signify possible Chronic Kidney Disease.    Anion gap 10 5 - 15    Comment: Performed at [facility] [address], [place], [place] [address]    Dg Chest 2 View  Result Date: 04/23/2017 CLINICAL DATA:  Diffuse edema EXAM: CHEST - 2 VIEW COMPARISON:  04/28/2015 FINDINGS: Low lung volumes with mild bibasilar atelectasis. Mild cardiomegaly with minimal central vascular congestion. No pleural effusion. No pneumothorax. IMPRESSION:  1. Low lung volumes with minimal basilar atelectasis 2. Mild cardiomegaly with slight central vascular congestion Electronically Signed   By: Donavan Foil M.D.   On: 04/23/2017 00:29   Ct Head Wo Contrast  Result Date: 04/23/2017 CLINICAL DATA:  Altered LOC EXAM: CT HEAD WITHOUT CONTRAST TECHNIQUE: Contiguous axial images were obtained from the base of the skull through the vertex without intravenous contrast. COMPARISON:  05/21/2012 FINDINGS: Brain: No acute territorial infarction, hemorrhage or intracranial mass. Normal ventricle size. Vascular: No hyperdense vessels.  No unexpected calcification Skull: Normal. Negative for fracture or focal lesion. Sinuses/Orbits: Mild mucosal thickening in the ethmoid and maxillary sinuses. Retention cysts in the right maxillary sinus. Other: Periorbital soft tissue swelling and soft tissue swelling over the forehead. IMPRESSION: 1. Negative non contrasted CT appearance of the brain 2. Periorbital and forehead soft tissue swelling Electronically Signed   By: Donavan Foil M.D.   On: 04/23/2017 00:08    Blood pressure (!) 100/51, pulse 66, temperature 97.7 F (36.5 C), temperature source Oral, resp. rate 14, weight 118.9 kg (262 lb 2 oz), SpO2 100 %. Physical Exam General: No apparent distress, lying quietly in bed. Eyes: No scleral show, mild periorbital puffiness, no lid lag, anicteric Neck: Supple, trachea midline, somewhat muffled voice consistent with enlarged tongue from hypothyroidism. Thyroid: Partially substernal which limits thyroid exam, no palpable nodule, not tender, mobile without fixation. Cardiovascular: Regular rhythm and rate, no murmur, normal radial pulses. Respiratory:  Normal respiratory effort, clear to auscultation over anterior lung fields. Gastrointestinal: Normal pitch hypoactive bowel sounds, nontender abdomen with mild distention. Neurologic: Cranial nerves normal as tested, deep tendon reflexes: diffuse hyporeflexia, no  tremor. Musculoskeletal: Normal muscle tone, no muscle atrophy. Skin: mildly cool, verrucae. Mental status: somnolent, but conversant, thought logical, no hallucinations or delusions evident. Hematologic/lymphatic: No cervical adenopathy, no jaundice.  Assessment/Plan: 1.  Severe hypothyroidism with associated:  A.  Hyponatremia  B.  Normocytic anemia  C.  Edema  Recommendations (discussed today by telephone with the attending physician): 1.  Start levothyroxine with 200 micrograms intravenous today as a loading dose 2.  Then levothyroxine 100 micrograms intravenous once a day for remainder of hospital stay 3.  Hydrocortisone therapy for now until we can exclude concurrent adrenal insufficiency (may be checked as an outpatient) 4.  Transition to levothyroxine 200 micrograms once a day by mouth after hospital discharge  Free T4 level may be checked up to once a day during inpatient stay.  TSH may be checked again as early as one week from now (but TSH is not expected to return to normal that quickly).    I plan to visit David Knapp again tomorrow.  He agreed to outpatient thyroid care with me after this hospital stay.  David Knapp 04/24/2017, 6:07 PM

## 2017-04-24 NOTE — Consult Note (Addendum)
[facility] Nephrology Consultation Note  Requesting MD: Dr. Waldron Labs Reason for consult: hyponatremia  HPI:  David Knapp is a 37 y.o. male.  With history of HIV on medications, hypothyroidism, viral warts, history of disseminated histoplasmosis,  CKD with serum creatinine level around 1.5, presented with worsening lower extremity edema and weight gain.  Patient also drinks 2-3 bottles of beer every day on average.  He is compliant with his HIV medication.  He denied nausea vomiting chest pain shortness of breath.  No abdominal pain.  In the ER, patient was found to have serum sodium level 125, creatinine 1.3 with elevated AST level.  Patient was found to have symptomatic hypothyroidism and possible myxedema.  He developed bradycardia with first-degree block.  He is currently on hydrocortisone.  He has no focal neurological deficit.  Denies dizziness or lightheadedness.  Serum sodium level decreased to 122 today.  Nephrology consult obtained for further evaluation.  Creat  Date/Time Value Ref Range Status  11/15/2016 12:21 PM 1.51 (H) 0.60 - 1.35 mg/dL Final  09/25/2016 10:32 AM 1.73 (H) 0.60 - 1.35 mg/dL Final  03/22/2016 04:41 PM 1.54 (H) 0.60 - 1.35 mg/dL Final  11/22/2015 02:17 PM 1.62 (H) 0.60 - 1.35 mg/dL Final  07/15/2014 04:20 PM 1.46 (H) 0.50 - 1.35 mg/dL Final  07/01/2014 03:14 PM 1.54 (H) 0.50 - 1.35 mg/dL Final  03/31/2014 03:22 PM 1.32 0.50 - 1.35 mg/dL Final  01/12/2014 01:54 PM 1.34 0.50 - 1.35 mg/dL Final  07/22/2013 11:06 AM 0.82 0.50 - 1.35 mg/dL Final  04/17/2013 10:58 AM 0.87 0.50 - 1.35 mg/dL Final  12/09/2012 04:36 PM 1.15 0.50 - 1.35 mg/dL Final  06/19/2012 09:37 AM 0.90 0.50 - 1.35 mg/dL Final  05/30/2012 10:36 AM 0.78 0.50 - 1.35 mg/dL Final  07/18/2010 03:02 PM 0.99 0.50 - 1.35 mg/dL Final   Creatinine, Ser  Date/Time Value Ref Range Status  04/24/2017 01:07 PM 0.99 0.61 - 1.24 mg/dL Final  04/24/2017 06:57 AM 1.07 0.61 - 1.24 mg/dL Final   04/23/2017 12:01 AM 1.30 (H) 0.61 - 1.24 mg/dL Final  05/01/2015 04:43 AM 1.77 (H) 0.61 - 1.24 mg/dL Final  04/30/2015 02:31 AM 1.78 (H) 0.61 - 1.24 mg/dL Final  04/29/2015 03:18 AM 1.75 (H) 0.61 - 1.24 mg/dL Final  04/28/2015 03:03 AM 1.45 (H) 0.61 - 1.24 mg/dL Final  04/27/2015 04:20 PM 1.44 (H) 0.61 - 1.24 mg/dL Final  05/23/2012 05:55 AM 0.73 0.50 - 1.35 mg/dL Final  05/22/2012 05:20 AM 0.88 0.50 - 1.35 mg/dL Final  05/21/2012 05:00 AM 0.68 0.50 - 1.35 mg/dL Final  05/20/2012 09:11 PM 0.72 0.50 - 1.35 mg/dL Final  05/17/2012 10:22 AM 1.02 0.50 - 1.35 mg/dL Final  05/16/2012 06:00 AM 0.97 0.50 - 1.35 mg/dL Final  05/15/2012 06:20 AM 0.72 0.50 - 1.35 mg/dL Final  05/12/2012 07:48 AM 0.74 0.50 - 1.35 mg/dL Final  04/08/2009 08:46 PM 1.31 0.40 - 1.50 mg/dL Final  12/23/2008 07:49 PM 1.01 (0.40-1.50 mg/dL Final  09/03/2008 08:55 PM 1.20 (0.40-1.50 mg/dL Final  05/21/2008 09:34 PM 1.22 0.40 - 1.50 mg/dL Final  01/23/2008 09:35 PM 0.80 0.40 - 1.50 mg/dL Final  10/24/2007 08:43 PM 0.93 0.40 - 1.50 mg/dL Final  07/01/2007 08:32 PM 0.70 0.40 - 1.50 mg/dL Final  03/15/2007 08:18 PM 0.86 0.40 - 1.50 mg/dL Final  01/08/2007 09:06 PM 0.80 0.40 - 1.50 mg/dL Final  09/18/2006 08:57 PM 0.70 0.40 - 1.50 mg/dL Final  07/12/2006 04:25 AM 0.93 DELTA CHECK NOTED  Final  07/11/2006 04:45  AM 0.49  Final  07/10/2006 04:05 AM 0.61  Final  07/09/2006 04:42 AM 0.53 DELTA CHECK NOTED  Final  07/08/2006 04:30 AM 0.81  Final  07/07/2006 04:50 AM 0.56  Final  07/06/2006 05:00 AM 0.67  Final  07/05/2006 04:00 AM 0.76  Final  07/04/2006 03:32 AM 0.55 DELTA CHECK NOTED  Final  07/03/2006 03:30 AM 0.33 (L)  Final  07/02/2006 05:23 AM 0.42  Final  07/01/2006 05:40 PM 0.38 (L)  Final  07/01/2006 04:00 AM 0.32 (L)  Final  06/30/2006 04:00 AM 0.46  Final  06/29/2006 04:10 AM 0.59  Final  06/28/2006 11:43 AM 0.90 0.40 - 1.50 mg/dL Final  06/20/2006 04:15 AM 0.47  Final  06/19/2006 04:30 AM 0.47  Final   06/18/2006 04:30 AM 0.70  Final  06/17/2006 05:30 AM 0.73  Final  06/16/2006 03:05 AM 0.83  Final  06/15/2006 03:45 AM 0.67  Final  06/14/2006 05:00 AM 0.95  Final  06/12/2006 06:20 AM 0.70  Final  06/11/2006 06:58 AM 0.61  Final  06/10/2006 08:19 PM 0.82  Final     PMHx:   Past Medical History:  Diagnosis Date  . Allergic reaction 03/22/2016  . Change in voice 03/22/2016  . CHF (congestive heart failure) ([place]) 04/23/2017  . CKD (chronic kidney disease), stage II   . 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 Possible recurrence 2014 (currently being evaluated 05/2012)  . Edema 04/27/2015  . HIV (human immunodeficiency virus infection) ([place])   . Hypothyroidism 04/24/2017  . Pancytopenia ([place])   . 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 Throat swelling 03/22/2016    Past Surgical History:  Procedure Laterality Date  . NO PAST SURGERIES      Family Hx:  Family History  Problem Relation Age of Onset  . Coronary artery disease Mother        died from MI @ age 28 yo    Social History:  reports that he has never smoked. He has never used smokeless tobacco. He reports that he does not drink alcohol or use drugs.  Allergies:  Allergies  Allergen Reactions  . Sulfa Antibiotics Other (See Comments)    He believes he has experienced throat swelling with prezcobix and Descovy for nearly a year since April 2017 to present March 22, 2016    Medications: Prior to Admission medications   Medication Sig Start Date End Date Taking? Authorizing Provider  acetaminophen (TYLENOL) 325 MG tablet Take 1 tablet (325 mg total) by mouth every 6 (six) hours as needed. 05/23/12   Otho Bellows, MD  bictegravir-emtricitabine-tenofovir AF [facility]) [id number] MG TABS tablet Take 1 tablet daily by mouth. 11/15/16   Tommy Medal, Lavell Islam, MD  hydrOXYzine (ATARAX/VISTARIL) 10 MG tablet Take 1 tablet (10 mg total) by mouth 3 (three) times daily as needed. 03/05/17    Campbell Riches, MD  imiquimod (ALDARA) 5 % cream Apply 3 (three) times a week topically. 11/15/16   Truman Hayward, MD  predniSONE (STERAPRED UNI-PAK 21 TAB) 10 MG (21) TBPK tablet 5 tabs on day 1, 4 tabs on day 2, 3 tabs on day 3 and 4, 2 tabs on day 5 and 6 and 7 03/05/17   Campbell Riches, MD    I have reviewed the patient's current medications.  Labs:  Results for orders placed or performed during the hospital encounter of 04/23/17 (from the past 48 hour(s))  Osmolality, urine     Status: None   Collection Time:  04/23/17  7:36 PM  Result Value Ref Range   Osmolality, Ur 699 300 - 900 mOsm/kg    Comment: Performed at [facility] [address], [place], [place] [address]  Histoplasma antigen, urine     Status: None   Collection Time: 04/23/17  7:37 PM  Result Value Ref Range   Histoplasma Antigen, urine <0.5 <0.5 ng/mL   Disclaimer: Comment     Comment: (NOTE) This test was developed and its performance characteristics determined by [facility]. It has not been cleared or approved by the Food and Drug Administration. Performed At: [facility] [place], [place] [id number] Rush Farmer MD [id number] Performed at [facility], [place] [address] [place], [place] [address]   Urinalysis, Routine w reflex microscopic     Status: None   Collection Time: 04/23/17  7:38 PM  Result Value Ref Range   Color, Urine YELLOW YELLOW   APPearance CLEAR CLEAR   Specific Gravity, Urine 1.018 1.005 - 1.030   pH 7.0 5.0 - 8.0   Glucose, UA NEGATIVE NEGATIVE mg/dL   Hgb urine dipstick NEGATIVE NEGATIVE   Bilirubin Urine NEGATIVE NEGATIVE   Ketones, ur NEGATIVE NEGATIVE mg/dL   Protein, ur NEGATIVE NEGATIVE mg/dL   Nitrite NEGATIVE NEGATIVE   Leukocytes, UA NEGATIVE NEGATIVE    Comment: Performed at [facility] [address] [place], [place] [address]  Sodium, urine, random     Status: None   Collection Time: 04/23/17  7:39 PM  Result  Value Ref Range   Sodium, Ur 126 mmol/L    Comment: Performed at [facility] [address], [place], [place] [address]  TSH     Status: Abnormal   Collection Time: 04/23/17  8:29 PM  Result Value Ref Range   TSH 96.129 (H) 0.350 - 4.500 uIU/mL    Comment: Performed by a 3rd Generation assay with a functional sensitivity of <=0.01 uIU/mL. Performed at [facility], [place] [address] [place], [place] [address]   Osmolality     Status: Abnormal   Collection Time: 04/23/17  8:29 PM  Result Value Ref Range   Osmolality 261 (L) 275 - 295 mOsm/kg    Comment: Performed at [facility], [place] [address] [place], [place] [address]  T4, free     Status: Abnormal   Collection Time: 04/23/17  8:29 PM  Result Value Ref Range   Free T4 <0.25 (L) 0.61 - 1.12 ng/dL    Comment: Performed at [facility] [address], [place], [place] [address]  Troponin I     Status: None   Collection Time: 04/23/17  8:29 PM  Result Value Ref Range   Troponin I <0.03 <0.03 ng/mL    Comment: Performed at [facility] [address] [place], [place] [address]  Gamma GT     Status: None   Collection Time: 04/23/17  8:29 PM  Result Value Ref Range   GGT 16 7 - 50 U/L    Comment: Performed at [facility], [place] [address] [place], [place] [address]  CBC with Differential/Platelet     Status: Abnormal   Collection Time: 04/24/17  6:57 AM  Result Value Ref Range   WBC 4.2 4.0 - 10.5 K/uL   RBC 4.03 (L) 4.22 - 5.81 MIL/uL   Hemoglobin 12.3 (L) 13.0 - 17.0 g/dL   HCT 36.6 (L) 39.0 - 52.0 %  MCV 90.8 78.0 - 100.0 fL   MCH 30.5 26.0 - 34.0 pg   MCHC 33.6 30.0 - 36.0 g/dL   RDW 14.1 11.5 - 15.5 %   Platelets 187 150 - 400 K/uL   Neutrophils Relative % 72 %   Neutro Abs 3.0 1.7 - 7.7 K/uL   Lymphocytes Relative 15 %   Lymphs Abs 0.6 (L) 0.7 - 4.0 K/uL   Monocytes Relative 6 %   Monocytes Absolute 0.2 0.1 - 1.0 K/uL   Eosinophils Relative 8 %   Eosinophils Absolute 0.3 0.0 -  0.7 K/uL   Basophils Relative 1 %   Basophils Absolute 0.0 0.0 - 0.1 K/uL    Comment: Performed at [facility] [address] [place], [place] [address]  Comprehensive metabolic panel     Status: Abnormal   Collection Time: 04/24/17  6:57 AM  Result Value Ref Range   Sodium 122 (L) 135 - 145 mmol/L   Potassium 3.7 3.5 - 5.1 mmol/L   Chloride 84 (L) 101 - 111 mmol/L   CO2 29 22 - 32 mmol/L   Glucose, Bld 99 65 - 99 mg/dL   BUN 7 6 - 20 mg/dL   Creatinine, Ser 1.07 0.61 - 1.24 mg/dL   Calcium 8.6 (L) 8.9 - 10.3 mg/dL   Total Protein 7.2 6.5 - 8.1 g/dL   Albumin 4.0 3.5 - 5.0 g/dL   AST 171 (H) 15 - 41 U/L   ALT 50 17 - 63 U/L   Alkaline Phosphatase 50 38 - 126 U/L   Total Bilirubin 1.2 0.3 - 1.2 mg/dL   GFR calc non Af Amer >60 >60 mL/min   GFR calc Af Amer >60 >60 mL/min    Comment: (NOTE) The eGFR has been calculated using the CKD EPI equation. This calculation has not been validated in all clinical situations. eGFR's persistently <60 mL/min signify possible Chronic Kidney Disease.    Anion gap 9 5 - 15    Comment: Performed at [facility] [address], [place], [place] [address]  Basic metabolic panel     Status: Abnormal   Collection Time: 04/24/17  1:07 PM  Result Value Ref Range   Sodium 120 (L) 135 - 145 mmol/L   Potassium 3.8 3.5 - 5.1 mmol/L   Chloride 83 (L) 101 - 111 mmol/L   CO2 27 22 - 32 mmol/L   Glucose, Bld 97 65 - 99 mg/dL   BUN 6 6 - 20 mg/dL   Creatinine, Ser 0.99 0.61 - 1.24 mg/dL   Calcium 8.6 (L) 8.9 - 10.3 mg/dL   GFR calc non Af Amer >60 >60 mL/min   GFR calc Af Amer >60 >60 mL/min    Comment: (NOTE) The eGFR has been calculated using the CKD EPI equation. This calculation has not been validated in all clinical situations. eGFR's persistently <60 mL/min signify possible Chronic Kidney Disease.    Anion gap 10 5 - 15    Comment: Performed at [facility] [address] [place], [place] [address]      ROS:  Pertinent items noted in HPI and remainder of comprehensive ROS otherwise negative.  Physical Exam: Vitals:   04/24/17 0517 04/24/17 1303  BP: 124/74 (!) 100/51  Pulse: 66 66  Resp: 16 14  Temp: 97.7 F (36.5 C)   SpO2: 95% 100%     General: Not in distress, lying in bed comfortable HEENT: No periorbital edema Heart: Regular rate rhythm  S1-S2 normal, no murmur appreciated Lungs: Clear bilateral, no wheezing or crackle Abdomen: Soft, nontender Extremities: Bilateral lower extremities pitting edema and thickened skin Skin: Generalized thickened skin and multiple digital warts. Neuro: Alert awake and oriented, nonfocal neurologic exam  Assessment/Plan:  #Chronic hyponatremia in the setting of severe hypothyroidism and beer intake.  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  He is alert awake and oriented.  He has no nausea vomiting.  He is not on fluid restriction at this time.  I will place him on fluid restriction to 1200 cc in 24 hours.  I do not think patient needs hypertonic saline at this time.  I will start on oral salt tablets.  Repeat a urine lites and osmolality.  Monitor BMP.  If further drops may need to consider hypertonic.  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  I will order a dose of Lasix today. Low dose because of borderline low BP.  #CKD stage II/III: On reviewing patient's medical record, he has elevated serum creatinine level since 2016 ranging from 1.3-1.7.  Now the creatinine level is lower than baseline likely contributed by hypervolemia.  IV Lasix.  Cherelle Midkiff Tanna Furry 04/24/2017, 2:52 PM

## 2017-04-25 ENCOUNTER — Encounter (HOSPITAL_COMMUNITY): Payer: Self-pay | Admitting: General Practice

## 2017-04-25 ENCOUNTER — Other Ambulatory Visit: Payer: Self-pay

## 2017-04-25 LAB — BASIC METABOLIC PANEL
Anion gap: 8 (ref 5–15)
Anion gap: 10 (ref 5–15)
Anion gap: 9 (ref 5–15)
BUN: 8 mg/dL (ref 6–20)
BUN: 8 mg/dL (ref 6–20)
BUN: 10 mg/dL (ref 6–20)
CALCIUM: 8.7 mg/dL — AB (ref 8.9–10.3)
CALCIUM: 8.8 mg/dL — AB (ref 8.9–10.3)
CHLORIDE: 82 mmol/L — AB (ref 101–111)
CO2: 30 mmol/L (ref 22–32)
CO2: 31 mmol/L (ref 22–32)
CO2: 31 mmol/L (ref 22–32)
CREATININE: 1.23 mg/dL (ref 0.61–1.24)
Calcium: 8.9 mg/dL (ref 8.9–10.3)
Chloride: 82 mmol/L — ABNORMAL LOW (ref 101–111)
Chloride: 80 mmol/L — ABNORMAL LOW (ref 101–111)
Creatinine, Ser: 1.11 mg/dL (ref 0.61–1.24)
Creatinine, Ser: 1.18 mg/dL (ref 0.61–1.24)
GFR calc Af Amer: >60 mL/min (ref >60)
GFR calc Af Amer: >60 mL/min (ref >60)
GFR calc non Af Amer: >60 mL/min (ref >60)
GFR calc non Af Amer: >60 mL/min (ref >60)
GLUCOSE: 117 mg/dL — AB (ref 65–99)
GLUCOSE: 108 mg/dL — AB (ref 65–99)
Glucose, Bld: 96 mg/dL (ref 65–99)
POTASSIUM: 3.6 mmol/L (ref 3.5–5.1)
POTASSIUM: 4 mmol/L (ref 3.5–5.1)
Potassium: 3.7 mmol/L (ref 3.5–5.1)
Sodium: 120 mmol/L — ABNORMAL LOW (ref 135–145)
Sodium: 121 mmol/L — ABNORMAL LOW (ref 135–145)
Sodium: 122 mmol/L — ABNORMAL LOW (ref 135–145)

## 2017-04-25 LAB — CBC
HCT: 36.4 % — ABNORMAL LOW (ref 39.0–52.0)
Hemoglobin: 12.8 g/dL — ABNORMAL LOW (ref 13.0–17.0)
MCH: 31.4 pg (ref 26.0–34.0)
MCHC: 35.2 g/dL (ref 30.0–36.0)
MCV: 89.2 fL (ref 78.0–100.0)
PLATELETS: 202 10*3/uL (ref 150–400)
RBC: 4.08 MIL/uL — AB (ref 4.22–5.81)
RDW: 13.9 % (ref 11.5–15.5)
WBC: 5.9 10*3/uL (ref 4.0–10.5)

## 2017-04-25 LAB — HEPATIC FUNCTION PANEL
ALBUMIN: 3.9 g/dL (ref 3.5–5.0)
ALK PHOS: 51 U/L (ref 38–126)
ALT: 49 U/L (ref 17–63)
AST: 147 U/L — ABNORMAL HIGH (ref 15–41)
BILIRUBIN TOTAL: 1 mg/dL (ref 0.3–1.2)
Bilirubin, Direct: 0.1 mg/dL (ref 0.1–0.5)
Indirect Bilirubin: 0.9 mg/dL (ref 0.3–0.9)
Total Protein: 7.1 g/dL (ref 6.5–8.1)

## 2017-04-25 LAB — MRSA PCR SCREENING: MRSA by PCR: NEGATIVE

## 2017-04-25 LAB — T-HELPER CELLS (CD4) COUNT (NOT AT ARMC)
CD4 % Helper T Cell: 15 % — ABNORMAL LOW (ref 33–55)
CD4 T CELL ABS: 70 /uL — AB (ref 400–2700)

## 2017-04-25 LAB — T4, FREE: FREE T4: 0.29 ng/dL — AB (ref 0.61–1.12)

## 2017-04-25 LAB — OSMOLALITY: OSMOLALITY: 259 mosm/kg — AB (ref 275–295)

## 2017-04-25 LAB — MAGNESIUM: Magnesium: 2 mg/dL (ref 1.7–2.4)

## 2017-04-25 MED ORDER — SODIUM CHLORIDE 1 G PO TABS
2.0000 g | ORAL_TABLET | Freq: Three times a day (TID) | ORAL | Status: DC
  Administered 2017-04-25 – 2017-04-27 (×8): 2 g via ORAL
  Filled 2017-04-25 (×8): qty 2

## 2017-04-25 MED ORDER — FUROSEMIDE 10 MG/ML IJ SOLN
20.0000 mg | Freq: Once | INTRAMUSCULAR | Status: AC
  Administered 2017-04-25: 20 mg via INTRAVENOUS
  Filled 2017-04-25: qty 2

## 2017-04-25 MED ORDER — POTASSIUM CHLORIDE CRYS ER 20 MEQ PO TBCR
40.0000 meq | EXTENDED_RELEASE_TABLET | Freq: Once | ORAL | Status: AC
  Administered 2017-04-25: 40 meq via ORAL
  Filled 2017-04-25: qty 2

## 2017-04-25 NOTE — Progress Notes (Signed)
Subjective: [name] feels better now.  He is pleased with his improvement.  His energy has increased (since starting levothyroxine, hydrocortisone and supportive care).  He hopes to return home as soon as possible.    Social history:  He has bills to pay before the end of the month.  Review of systems: Neuro: No tremor Cardiac:  No palpitations. Gastrointestinal:  No diarrhea, Two bowel movements last evening.   Objective: Vital signs in last 24 hours: Temp:  [97.4 F (36.3 C)-97.9 F (36.6 C)] 97.9 F (36.6 C) (04/24 1308) Pulse Rate:  [60-71] 71 (04/24 1308) Resp:  [16-19] 19 (04/24 1308) BP: (96-115)/(47-63) 98/49 (04/24 1308) SpO2:  [95 %-100 %] 96 % (04/24 1308) Weight:  [116.8 kg (257 lb 8 oz)-118.5 kg (261 lb 3.9 oz)] 116.8 kg (257 lb 8 oz) (04/24 0500) Weight change: -0.4 kg (-14.1 oz) Last BM Date: 04/24/17  Intake/Output from previous day: 04/23 0701 - 04/24 0700 In: 3 [I.V.:3] Out: 750 [Urine:750] Intake/Output this shift: Total I/O In: 240 [P.O.:240] Out: 975 [Urine:975]  Physical Exam General: No apparent distress, lying quietly in bed. Eyes: No scleral show, mild periorbital puffiness, no lid lag, anicteric Neck: Supple, trachea midline, somewhat muffled voice consistent with enlarged tongue from hypothyroidism. Thyroid: Partially substernal which limits thyroid exam, no palpable nodule, not tender, mobile without fixation. Cardiovascular: Regular rhythm and rate, no murmur, normal radial pulses, edema in legs. Respiratory: Normal respiratory effort, clear to auscultation over anterior lung fields. Gastrointestinal: Normal pitch active bowel sounds, nontender abdomen. Neurologic: Cranial nerves normal as tested, deep tendon reflexes: diffuse hyporeflexia, no tremor. Musculoskeletal: Normal muscle tone, no muscle atrophy. Skin: mildly cool, verrucae. Mental status: more alert than yesterday, conversant, appropriate affect, thought logical, no hallucinations  or delusions evident. Hematologic/lymphatic: No cervical adenopathy, no jaundice.   Lab Results  Component Value Date   TSH 96.129 (H) 04/23/2017   FREET4 <0.25 (L) 04/23/2017   FREET4 0.94 07/02/2006   FREET4 0.81 (L) 06/28/2006   HGB 12.8 (L) 04/25/2017   NA 121 (L) 04/25/2017   CRTSLPL 13.2 04/27/2015    Assessment/Plan: 1.  Severe hypothyroidism with associated:             A.  Hyponatremia             B.  Normocytic anemia             C.  Edema  Recommendations: 1.  Levothyroxine 100 micrograms intravenous once a day for remainder of hospital stay 2.  Hydrocortisone therapy for now until we can exclude concurrent adrenal insufficiency (may be checked as an outpatient) 3.  Transition to levothyroxine 200 micrograms once a day by mouth after hospital discharge 4.  Hospital discharge planning at the discretion of attending hospitalist  [name] agreed to an office visit with me at 2:00PM on Friday, April 27, 2017.  If he remains in the hospital or otherwise unable to keep that appointment, then we can reschedule his office visit with me.     LOS: 2 days   Ulys Favia 04/25/2017, 4:44 PM

## 2017-04-25 NOTE — Progress Notes (Signed)
PROGRESS NOTE    David Knapp  ZOX:[id number] DOB: Aug 29, 1980 DOA: 04/23/2017 PCP: David Knapp, No Pcp Per   Brief Narrative:  37 y.o.malewith medical history significant ofHIV, CKD stage II,HFpEF,histoplasmosis, and pancytopenia; who presents with complaints of progressive swelling over the last several months.  David Knapp was sent as a direct admission from ID clinic, giving abnormal labs including hyponatremia, and progressive swelling no response to steroids and diuresis, by reviewing workup, she had abnormal TSH hormone couple years ago, not on any Synthroid supplement, and was noted to have significant fluid retention, bradycardia, hyponatremia, workup significant for low free T4, and significantly elevated TSH  Assessment & Plan:   Principal Problem:   Peripheral edema Active Problems:   HIV disease (HCC)   Hypothyroidism   Hyponatremia   CKD (chronic kidney disease), stage III (HCC)   Hypothyroidism/myxedema - Pt drowsy and with diffuse edema.  Wakes up and responds appropriately. - TSH 96 and free T4 <0.25  - Appreciate Dr. Daune Knapp assistance - S/p 200 mcg IV synthroid followed by 100 mcg IV daily - Started on hydrocortisone 50 mg IV q 8 hrs.  Plan for evaluation for adrenal insufficiency as outpatient - Echo with normal LV function, small pericardial effusion - ctm swelling with treatment of hypothyroidism  - daily Free T4, consider repeating TSH in about 1 week  Hyponatremia - Likely 2/2 hypothyroidism above - urine sodium 146, urine osm 568.   Serum osm low to 259.  - Na 125 at presentation, has downtrended to 120 -> 121  - appreciate nephrology assistance (started on salt tabs, IV lasix, fluid restriction)   Bradycardiawith first-degree heart block: - Likely 2/2 hypothyroidism, currently pt asymptomatic, hemodynamically stable.   - continue telemetry  History of histoplasmosis -Follow-up urine histoplasmosis (negative)  HIV:  - David Knapp reportstaking his  antiretroviral medications as prescribed.  - CD4 count 70 - HIV RNA quant pending -ContinueBiktarvy -Continue outpatient follow-up  Elevated AST: - Acute on chronic.David Knapp presented with a AST of 164 with ALT 54. Baseline previously noted to be around 120 during last check on 11/15/2016.  -Check GGT(wnl) - continue to follow, consider [place]  H/O HFpEF: - David Knapp's last EF was noted to be 65-70% on 04/29/2015. - Repeat echo with normal EF - continue to monitor  Warts -Continue cream  Chronic kidney disease stage II: - David Knapp creatinine appears slightly improved at 1.18 as baseline creatinine previously noted to be around 1.5-1.7. - continue to monitor  Anemia: mild, chronic, follow  DVT prophylaxis: lovenox Code Status: full  Family Communication: none at bedside Disposition Plan: pending improvement   Consultants:   Endocrine  Infectious disease  Procedures:  Echo 4/23 Study Conclusions  - Left ventricle: The cavity size was normal. Wall thickness was   normal. Systolic function was normal. The estimated ejection   fraction was in the range of 55% to 60%. Wall motion was normal;   there were no regional wall motion abnormalities. The study is   not technically sufficient to allow evaluation of LV diastolic   function. - Pericardium, extracardiac: A small pericardial effusion was   identified.  Impressions:  - Normal LV systolic function; small pericardial effusion.  Antimicrobials: Anti-infectives (From admission, onward)   Start     Dose/Rate Route Frequency Ordered Stop   04/24/17 1000  bictegravir-emtricitabine-tenofovir AF (BIKTARVY) 50-200-25 MG per tablet 1 tablet     1 tablet Oral Daily 04/23/17 1835           Subjective: Spanish interpreter used  No pain. Swelling and fatigue seem to have been present for about 1 month.   Objective: Vitals:   04/25/17 0500 04/25/17 0700 04/25/17 1000 04/25/17 1004  BP:  (!) 96/47  (!)  109/53  Pulse:    70  Resp:  16  18  Temp:  97.6 F (36.4 C)  (!) 97.5 F (36.4 C)  TempSrc:    Oral  SpO2:  100%  100%  Weight: 116.8 kg (257 lb 8 oz)     Height:   5' 8.5" (1.74 m)     Intake/Output Summary (Last 24 hours) at 04/25/2017 1248 Last data filed at 04/25/2017 1018 Gross per 24 hour  Intake 243 ml  Output 1425 ml  Net -1182 ml   Filed Weights   04/24/17 0500 04/24/17 1900 04/25/17 0500  Weight: 118.9 kg (262 lb 2 oz) 118.5 kg (261 lb 3.9 oz) 116.8 kg (257 lb 8 oz)    Examination:  General exam: Appears calm and comfortable.  Sleepy.  Respiratory system: Clear to auscultation. Respiratory effort normal. Cardiovascular system: S1 & S2 heard, RRR. No JVD, murmurs, rubs, gallops or clicks. No pedal edema. Gastrointestinal system: Abdomen is nondistended, soft and nontender. No organomegaly or masses felt. Normal bowel sounds heard. Central nervous system: Alert and oriented. No focal neurological deficits. Extremities: Symmetric 5 x 5 power. Skin: Extensive verrucous lesions to upper extremities  Psychiatry: Judgement and insight appear normal. Mood & affect appropriate.     Data Reviewed: I have personally reviewed following labs and imaging studies  CBC: Recent Labs  Lab 04/23/17 0001 04/24/17 0657 04/25/17 0230  WBC 4.7 4.2 5.9  NEUTROABS 3.7 3.0  --   HGB 12.7* 12.3* 12.8*  HCT 36.9* 36.6* 36.4*  MCV 90.9 90.8 89.2  PLT 185 187 202   Basic Metabolic Panel: Recent Labs  Lab 04/23/17 0001 04/24/17 0657 04/24/17 1307 04/24/17 1937 04/25/17 0230  NA 125* 122* 120* 121* 120*  K 4.4 3.7 3.8 3.8 3.7  CL 86* 84* 83* 82* 82*  CO2 28 29 27 30 30   GLUCOSE 101* 99 97 99 117*  BUN 12 7 6 7 8   CREATININE 1.30* 1.07 0.99 1.27* 1.11  CALCIUM 8.7* 8.6* 8.6* 9.0 8.7*  MG  --   --   --   --  2.0   GFR: Estimated Creatinine Clearance: 113 mL/min (by C-G formula based on SCr of 1.11 mg/dL). Liver Function Tests: Recent Labs  Lab 04/23/17 0001  04/24/17 0657  AST 164* 171*  ALT 54 50  ALKPHOS 58 50  BILITOT 1.2 1.2  PROT 8.0 7.2  ALBUMIN 4.5 4.0   Recent Labs  Lab 04/23/17 0001  LIPASE 30   Recent Labs  Lab 04/23/17 0001  AMMONIA 34   Coagulation Profile: No results for input(s): INR, PROTIME in the last 168 hours. Cardiac Enzymes: Recent Labs  Lab 04/23/17 2029  TROPONINI <0.03   BNP (last 3 results) No results for input(s): PROBNP in the last 8760 hours. HbA1C: No results for input(s): HGBA1C in the last 72 hours. CBG: No results for input(s): GLUCAP in the last 168 hours. Lipid Profile: No results for input(s): CHOL, HDL, LDLCALC, TRIG, CHOLHDL, LDLDIRECT in the last 72 hours. Thyroid Function Tests: Recent Labs    04/23/17 2029  TSH 96.129*  FREET4 <0.25*   Anemia Panel: No results for input(s): VITAMINB12, FOLATE, FERRITIN, TIBC, IRON, RETICCTPCT in the last 72 hours. Sepsis Labs: Recent Labs  Lab 04/23/17 0006  LATICACIDVEN 1.58  Recent Results (from the past 240 hour(s))  MRSA PCR Screening     Status: None   Collection Time: 04/25/17  8:02 AM  Result Value Ref Range Status   MRSA by PCR NEGATIVE NEGATIVE Final    Comment:        The GeneXpert MRSA Assay (FDA approved for NASAL specimens only), is one component of a comprehensive MRSA colonization surveillance program. It is not intended to diagnose MRSA infection nor to guide or monitor treatment for MRSA infections. Performed at [facility], [address] [place], [place] [address]          Radiology Studies: No results found.      Scheduled Meds: . bictegravir-emtricitabine-tenofovir AF  1 tablet Oral Daily  . enoxaparin (LOVENOX) injection  40 mg Subcutaneous Q24H  . hydrocortisone sod succinate (SOLU-CORTEF) inj  50 mg Intravenous Q8H  . imiquimod   Topical Once per day on Mon Wed Fri  . levothyroxine  100 mcg Intravenous Daily  . sodium chloride flush  3 mL Intravenous Q12H  . sodium chloride  2  g Oral TID WC   Continuous Infusions: . sodium chloride       LOS: 2 days    Time spent: over 30 min    Lacretia Nicksaldwell Powell, MD [facility] Pager [phone]  If 7PM-7AM, please contact night-coverage [email or web address] Password [email or web address] 04/25/2017, 12:48 PM

## 2017-04-25 NOTE — Progress Notes (Signed)
[facility] NEPHROLOGY PROGRESS NOTE  Assessment/ Plan: Pt is a 37 y.o. yo male HIV on medication, hypothyroidism, viral warts,history of disseminated histoplasmosis,  CKD with serum creatinine level around 1.5, presented with worsening lower extremity edema and weight gain.  Patient also drinks 2-3 bottles of beer every day on average.  Consulted for the management of hyponatremia.   Patient with TSH level of >90 on admission.  Assessment/Plan:  #Chronic hyponatremia in the setting of severe hypothyroidism and beer intake.  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  He is alert awake and oriented.  He has no nausea vomiting.  Continue fluid restriction.  Change salt tablets to 3 times daily and ordered a dose of Lasix this morning for the management of edema.  Repeat BMP in the afternoon.  He is receiving IV Synthroid.  His mental status is good.  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  #CKD stage II/III: On reviewing patient's medical record, he has elevated serum creatinine level since 2016 ranging from 1.3-1.7.  Now the creatinine level is lower than baseline likely contributed by hypervolemia.  Stable now.  Subjective: Seen and examined at bedside.  [name], not in distress.  Denied nausea vomiting chest pain. Objective Vital signs in last 24 hours: Vitals:   04/25/17 0500 04/25/17 0700 04/25/17 1000 04/25/17 1004  BP:  (!) 96/47  (!) 109/53  Pulse:    70  Resp:  16  18  Temp:  97.6 F (36.4 C)  (!) 97.5 F (36.4 C)  TempSrc:    Oral  SpO2:  100%  100%  Weight: 116.8 kg (257 lb 8 oz)     Height:   5' 8.5" (1.74 m)    Weight change: -0.4 kg (-14.1 oz)  Intake/Output Summary (Last 24 hours) at 04/25/2017 1250 Last data filed at 04/25/2017 1018 Gross per 24 hour  Intake 243 ml  Output 1425 ml  Net -1182 ml        Labs: Basic Metabolic Panel: Recent Labs  Lab 04/24/17 1307 04/24/17 1937 04/25/17 0230  NA 120* 121* 120*  K 3.8 3.8 3.7  CL 83* 82* 82*  CO2 27 30 30   GLUCOSE 97 99 117*  BUN 6 7 8   CREATININE 0.99 1.27* 1.11  CALCIUM 8.6* 9.0 8.7*   Liver Function Tests: Recent Labs  Lab 04/23/17 0001 04/24/17 0657  AST 164* 171*  ALT 54 50  ALKPHOS 58 50  BILITOT 1.2 1.2  PROT 8.0 7.2  ALBUMIN 4.5 4.0   Recent Labs  Lab 04/23/17 0001  LIPASE 30   Recent Labs  Lab 04/23/17 0001  AMMONIA 34   CBC: Recent Labs  Lab 04/23/17 0001 04/24/17 0657 04/25/17 0230  WBC 4.7 4.2 5.9  NEUTROABS 3.7 3.0  --   HGB 12.7* 12.3* 12.8*  HCT 36.9* 36.6* 36.4*  MCV 90.9 90.8 89.2  PLT 185 187 202   Cardiac Enzymes: Recent Labs  Lab 04/23/17 2029  TROPONINI <0.03   CBG: No results for input(s): GLUCAP in the last 168 hours.  Iron Studies: No results for input(s): IRON, TIBC, TRANSFERRIN, FERRITIN in the last 72 hours. Studies/Results: No results found.  Medications: Infusions: . sodium chloride      Scheduled Medications: . bictegravir-emtricitabine-tenofovir AF  1 tablet Oral Daily  . enoxaparin (LOVENOX) injection  40 mg Subcutaneous Q24H  . hydrocortisone  sod succinate (SOLU-CORTEF) inj  50 mg Intravenous Q8H  . imiquimod   Topical Once per day on Mon Wed Fri  . levothyroxine  100 mcg Intravenous Daily  . sodium chloride flush  3 mL Intravenous Q12H  . sodium chloride  2 g Oral TID WC    have reviewed scheduled and prn medications.  Physical Exam: General: Not in distress Heart: Regular rate rhythm S1-S2 normal Lungs: Distant lungs sound, no wheezing Abdomen: Soft Extremities: Bilateral lower extremity edema with thickened skin   David Knapp 04/25/2017,12:50 PM  LOS: 2 days

## 2017-04-26 LAB — T4, FREE: FREE T4: 0.37 ng/dL — AB (ref 0.61–1.12)

## 2017-04-26 LAB — BASIC METABOLIC PANEL
Anion gap: 8 (ref 5–15)
Anion gap: 10 (ref 5–15)
BUN: 10 mg/dL (ref 6–20)
BUN: 12 mg/dL (ref 6–20)
CHLORIDE: 85 mmol/L — AB (ref 101–111)
CO2: 30 mmol/L (ref 22–32)
CO2: 32 mmol/L (ref 22–32)
CREATININE: 1.43 mg/dL — AB (ref 0.61–1.24)
Calcium: 8.8 mg/dL — ABNORMAL LOW (ref 8.9–10.3)
Calcium: 9.1 mg/dL (ref 8.9–10.3)
Chloride: 84 mmol/L — ABNORMAL LOW (ref 101–111)
Creatinine, Ser: 1.08 mg/dL (ref 0.61–1.24)
GFR calc Af Amer: >60 mL/min (ref >60)
GFR calc Af Amer: >60 mL/min (ref >60)
GFR calc non Af Amer: >60 mL/min (ref >60)
GLUCOSE: 108 mg/dL — AB (ref 65–99)
Glucose, Bld: 109 mg/dL — ABNORMAL HIGH (ref 65–99)
POTASSIUM: 4.1 mmol/L (ref 3.5–5.1)
Potassium: 4.1 mmol/L (ref 3.5–5.1)
SODIUM: 126 mmol/L — AB (ref 135–145)
Sodium: 123 mmol/L — ABNORMAL LOW (ref 135–145)

## 2017-04-26 LAB — HIV-1 RNA QUANT-NO REFLEX-BLD: LOG10 HIV-1 RNA: UNDETERMINED {Log_copies}/mL

## 2017-04-26 LAB — HEPATIC FUNCTION PANEL
ALK PHOS: 54 U/L (ref 38–126)
ALT: 50 U/L (ref 17–63)
AST: 134 U/L — ABNORMAL HIGH (ref 15–41)
Albumin: 4.1 g/dL (ref 3.5–5.0)
BILIRUBIN DIRECT: 0.1 mg/dL (ref 0.1–0.5)
BILIRUBIN TOTAL: 1 mg/dL (ref 0.3–1.2)
Indirect Bilirubin: 0.9 mg/dL (ref 0.3–0.9)
Total Protein: 7.3 g/dL (ref 6.5–8.1)

## 2017-04-26 LAB — CBC
HEMATOCRIT: 36.7 % — AB (ref 39.0–52.0)
Hemoglobin: 12.7 g/dL — ABNORMAL LOW (ref 13.0–17.0)
MCH: 31.3 pg (ref 26.0–34.0)
MCHC: 34.6 g/dL (ref 30.0–36.0)
MCV: 90.4 fL (ref 78.0–100.0)
Platelets: 231 10*3/uL (ref 150–400)
RBC: 4.06 MIL/uL — ABNORMAL LOW (ref 4.22–5.81)
RDW: 14.5 % (ref 11.5–15.5)
WBC: 6.8 10*3/uL (ref 4.0–10.5)

## 2017-04-26 LAB — MAGNESIUM: MAGNESIUM: 2 mg/dL (ref 1.7–2.4)

## 2017-04-26 MED ORDER — DAPSONE 100 MG PO TABS
100.0000 mg | ORAL_TABLET | Freq: Every day | ORAL | Status: DC
  Administered 2017-04-26 – 2017-04-27 (×2): 100 mg via ORAL
  Filled 2017-04-26 (×2): qty 1

## 2017-04-26 MED ORDER — ITRACONAZOLE 100 MG PO CAPS
200.0000 mg | ORAL_CAPSULE | Freq: Every day | ORAL | Status: DC
  Administered 2017-04-26 – 2017-04-27 (×2): 200 mg via ORAL
  Filled 2017-04-26 (×2): qty 2

## 2017-04-26 MED ORDER — FUROSEMIDE 10 MG/ML IJ SOLN
40.0000 mg | Freq: Every day | INTRAMUSCULAR | Status: DC
  Administered 2017-04-26 – 2017-04-27 (×2): 40 mg via INTRAVENOUS
  Filled 2017-04-26 (×2): qty 4

## 2017-04-26 MED ORDER — HYDROCORTISONE 10 MG PO TABS
10.0000 mg | ORAL_TABLET | Freq: Two times a day (BID) | ORAL | Status: DC
  Administered 2017-04-27: 10 mg via ORAL
  Filled 2017-04-26: qty 1

## 2017-04-26 NOTE — Progress Notes (Signed)
[facility] NEPHROLOGY PROGRESS NOTE  Assessment/ Plan: Pt is a 37 y.o. yo male HIV on medication, hypothyroidism, viral warts,history of disseminated histoplasmosis,  CKD with serum creatinine level around 1.5, presented with worsening lower extremity edema and weight gain.  Patient also drinks 2-3 bottles of beer every day on average.  Consulted for the management of hyponatremia.   Patient with TSH level of >90 on admission.  Assessment/Plan:  #Chronic hyponatremia in the setting of severe hypothyroidism and beer intake.  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 -Clinically improving.  Serum sodium level 123 today.  Continue salt tablet, fluid restriction and starting IV Lasix 40 daily.  His mental status is good and feeling better.    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  Lasix for edema management.  #CKD stage II/III: On reviewing patient's medical record, he has elevated serum creatinine level since 2016 ranging from 1.3-1.7.  Now the creatinine level is lower than baseline likely contributed by hypervolemia.  Creatinine is stable now.  Subjective: Seen and examined at bedside.  Denied nausea vomiting chest pain shortness of breath. Objective Vital signs in last 24 hours: Vitals:   04/25/17 1936 04/25/17 2355 04/26/17 0316 04/26/17 0500  BP: (!) 97/52 98/70 (!) 106/55   Pulse: 70 67 70   Resp: 20 20 (!) 24   Temp: 98.7 F (37.1 C) 97.7 F (36.5 C) 97.7 F (36.5 C)   TempSrc: Oral Oral Oral   SpO2: 97% 100% 98%   Weight:    116.5 kg (256 lb 13.4 oz)  Height:       Weight change: -2 kg (-4 lb 6.6 oz)  Intake/Output Summary (Last 24 hours) at 04/26/2017 0859 Last data filed at 04/26/2017 0858 Gross per 24 hour  Intake 243 ml  Output 1875 ml  Net -1632 ml       Labs: Basic Metabolic Panel: Recent Labs  Lab 04/25/17 1216 04/25/17 1656  04/26/17 0439  NA 121* 122* 123*  K 3.6 4.0 4.1  CL 80* 82* 85*  CO2 31 31 30   GLUCOSE 108* 96 109*  BUN 8 10 10   CREATININE 1.18 1.23 1.08  CALCIUM 8.8* 8.9 8.8*   Liver Function Tests: Recent Labs  Lab 04/24/17 0657 04/25/17 1656 04/26/17 0439  AST 171* 147* 134*  ALT 50 49 50  ALKPHOS 50 51 54  BILITOT 1.2 1.0 1.0  PROT 7.2 7.1 7.3  ALBUMIN 4.0 3.9 4.1   Recent Labs  Lab 04/23/17 0001  LIPASE 30   Recent Labs  Lab 04/23/17 0001  AMMONIA 34   CBC: Recent Labs  Lab 04/23/17 0001 04/24/17 0657 04/25/17 0230 04/26/17 0439  WBC 4.7 4.2 5.9 6.8  NEUTROABS 3.7 3.0  --   --   HGB 12.7* 12.3* 12.8* 12.7*  HCT 36.9* 36.6* 36.4* 36.7*  MCV 90.9 90.8 89.2 90.4  PLT 185 187 202 231   Cardiac Enzymes: Recent Labs  Lab 04/23/17 2029  TROPONINI <0.03   CBG: No results for input(s): GLUCAP in the last 168 hours.  Iron Studies: No results for input(s): IRON, TIBC, TRANSFERRIN, FERRITIN in the last 72 hours. Studies/Results: No results found.  Medications: Infusions: . sodium chloride      Scheduled Medications: . bictegravir-emtricitabine-tenofovir AF  1 tablet Oral Daily  . enoxaparin (LOVENOX) injection  40 mg Subcutaneous Q24H  . furosemide  40 mg Intravenous Daily  . hydrocortisone  sod succinate (SOLU-CORTEF) inj  50 mg Intravenous Q8H  . imiquimod   Topical Once per day on Mon Wed Fri  . levothyroxine  100 mcg Intravenous Daily  . sodium chloride flush  3 mL Intravenous Q12H  . sodium chloride  2 g Oral TID WC    have reviewed scheduled and prn medications.  Physical Exam: General: Not in distress Heart: Regular rate rhythm S1-S2 normal Lungs: Distant lung sounds, no wheezing Abdomen: Soft Extremities: Bilateral lower extremity edema with thickened skin   Dron Prasad Bhandari 04/26/2017,8:59 AM  LOS: 3 days

## 2017-04-26 NOTE — Progress Notes (Signed)
PROGRESS NOTE    David DecreeJavier Knapp  ZOX:[id number] DOB: 01-26-1980 DOA: 04/23/2017 PCP: Patient, No Pcp Per   Brief Narrative:  37 y.o.malewith medical history significant ofHIV, CKD stage II,HFpEF,histoplasmosis, and pancytopenia; who presents with complaints of progressive swelling over the last several months.  Patient was sent as David Knapp direct admission from ID clinic, giving abnormal labs including hyponatremia, and progressive swelling no response to steroids and diuresis, by reviewing workup, she had abnormal TSH hormone couple years ago, not on any Synthroid supplement, and was noted to have significant fluid retention, bradycardia, hyponatremia, workup significant for low free T4, and significantly elevated TSH  Assessment & Plan:   Principal Problem:   Peripheral edema Active Problems:   HIV disease (HCC)   Hypothyroidism   Hyponatremia   CKD (chronic kidney disease), stage III (HCC)   Hypothyroidism/myxedema - Pt initially drowsy and with diffuse edema.  Wakes up and responds appropriately. - TSH 96 and free T4 <0.25  - UA without proteinuria - Appreciate Dr. Daune PerchKerr's assistance - S/p 200 mcg IV synthroid followed by 100 mcg IV daily - Started on hydrocortisone 50 mg IV q 8 hrs.  Will plan to switch to PO steroids tomorrow.  Plan for evaluation for adrenal insufficiency as outpatient - Echo with normal LV function, small pericardial effusion - ctm swelling with treatment of hypothyroidism  - daily Free T4 (improving), consider repeating TSH in about 1 week - LE US pending  Hyponatremia - Likely 2/2 hypothyroidism above - urine sodium 146, urine osm 568.   Serum osm low to 259.  - Na 125 at presentation, initially downtrended to as low as 120.  Now improving slowly.  - appreciate nephrology assistance (started on salt tabs, IV lasix, fluid restriction)   Bradycardiawith first-degree heart block: - Likely 2/2 hypothyroidism, currently pt asymptomatic, hemodynamically  stable.   - continue telemetry, stable  Likely OSA - pt desats at night, but normal O2 sat during the day at rest.  Will need outpatient sleep study   History of histoplasmosis -Follow-up urine histoplasmosis (negative)  HIV:  - Patient reportstaking his antiretroviral medications as prescribed.  - CD4 count 70 -> starting on dapsone and itraconazole - HIV RNA quant pending -ContinueBiktarvy -Continue outpatient follow-up  Elevated AST: - Acute on chronic.Patient presented with Pete Schnitzer AST of 164 with ALT 54. Baseline previously noted to be around 120 during last check on 11/15/2016.  -Check GGT(wnl) - persistently elevated.  Will recheck viral hepatitis studies.   H/O HFpEF: - Patient's last EF was noted to be 65-70% on 04/29/2015. - Repeat echo with normal EF - continue to monitor  Warts -Continue cream  Chronic kidney disease stage II: - Patient creatinine appears slightly improved at 1.08 as baseline creatinine previously noted to be around 1.5-1.7. - continue to monitor  Anemia: stable, chronic, follow  DVT prophylaxis: lovenox Code Status: full  Family Communication: none at bedside Disposition Plan: pending improvement   Consultants:   Endocrine  Infectious disease  Procedures:  Echo 4/23 Study Conclusions  - Left ventricle: The cavity size was normal. Wall thickness was   normal. Systolic function was normal. The estimated ejection   fraction was in the range of 55% to 60%. Wall motion was normal;   there were no regional wall motion abnormalities. The study is   not technically sufficient to allow evaluation of LV diastolic   function. - Pericardium, extracardiac: Alycia Cooperwood small pericardial effusion was   identified.  Impressions:  - Normal LV systolic function;  small pericardial effusion.  Antimicrobials: Anti-infectives (From admission, onward)   Start     Dose/Rate Route Frequency Ordered Stop   04/26/17 1000  itraconazole  (SPORANOX) capsule 200 mg     200 mg Oral Daily 04/26/17 0906     04/26/17 1000  dapsone tablet 100 mg     100 mg Oral Daily 04/26/17 0906     04/24/17 1000  bictegravir-emtricitabine-tenofovir AF (BIKTARVY) 50-200-25 MG per tablet 1 tablet     1 tablet Oral Daily 04/23/17 1835           Subjective: Spanish interpreter used  No pain.  Feeling better.  Swelling seems better.  Objective: Vitals:   04/25/17 2355 04/26/17 0316 04/26/17 0500 04/26/17 0934  BP: 98/70 (!) 106/55  (!) 95/57  Pulse: 67 70    Resp: 20 (!) 24  20  Temp: 97.7 F (36.5 C) 97.7 F (36.5 C)  98.4 F (36.9 C)  TempSrc: Oral Oral  Oral  SpO2: 100% 98%  97%  Weight:   116.5 kg (256 lb 13.4 oz)   Height:        Intake/Output Summary (Last 24 hours) at 04/26/2017 1526 Last data filed at 04/26/2017 0858 Gross per 24 hour  Intake 3 ml  Output 900 ml  Net -897 ml   Filed Weights   04/24/17 1900 04/25/17 0500 04/26/17 0500  Weight: 118.5 kg (261 lb 3.9 oz) 116.8 kg (257 lb 8 oz) 116.5 kg (256 lb 13.4 oz)    Examination:  General: No acute distress. Cardiovascular: Heart sounds show Britian Jentz regular rate, and rhythm. No gallops or rubs. No murmurs. No JVD. Lungs: Clear to auscultation bilaterally with good air movement. No rales, rhonchi or wheezes. Abdomen: Soft, nontender, nondistended with normal active bowel sounds. No masses. No hepatosplenomegaly. Neurological: Alert and oriented 3. Moves all extremities 4 with equal strength. Cranial nerves II through XII grossly intact. Skin: Verucous lesions to upper extremities. Extremities: No clubbing or cyanosis. Diffuse edema.  Pitting on R>L.  Psychiatric: Mood and affect are normal. Insight and judgment are appropriate.    Data Reviewed: I have personally reviewed following labs and imaging studies  CBC: Recent Labs  Lab 04/23/17 0001 04/24/17 0657 04/25/17 0230 04/26/17 0439  WBC 4.7 4.2 5.9 6.8  NEUTROABS 3.7 3.0  --   --   HGB 12.7* 12.3* 12.8*  12.7*  HCT 36.9* 36.6* 36.4* 36.7*  MCV 90.9 90.8 89.2 90.4  PLT 185 187 202 231   Basic Metabolic Panel: Recent Labs  Lab 04/24/17 1937 04/25/17 0230 04/25/17 1216 04/25/17 1656 04/26/17 0439  NA 121* 120* 121* 122* 123*  K 3.8 3.7 3.6 4.0 4.1  CL 82* 82* 80* 82* 85*  CO2 30 30 31 31 30   GLUCOSE 99 117* 108* 96 109*  BUN 7 8 8 10 10   CREATININE 1.27* 1.11 1.18 1.23 1.08  CALCIUM 9.0 8.7* 8.8* 8.9 8.8*  MG  --  2.0  --   --   --    GFR: Estimated Creatinine Clearance: 116 mL/min (by C-G formula based on SCr of 1.08 mg/dL). Liver Function Tests: Recent Labs  Lab 04/23/17 0001 04/24/17 0657 04/25/17 1656 04/26/17 0439  AST 164* 171* 147* 134*  ALT 54 50 49 50  ALKPHOS 58 50 51 54  BILITOT 1.2 1.2 1.0 1.0  PROT 8.0 7.2 7.1 7.3  ALBUMIN 4.5 4.0 3.9 4.1   Recent Labs  Lab 04/23/17 0001  LIPASE 30   Recent Labs  Lab 04/23/17 0001  AMMONIA 34   Coagulation Profile: No results for input(s): INR, PROTIME in the last 168 hours. Cardiac Enzymes: Recent Labs  Lab 04/23/17 2029  TROPONINI <0.03   BNP (last 3 results) No results for input(s): PROBNP in the last 8760 hours. HbA1C: No results for input(s): HGBA1C in the last 72 hours. CBG: No results for input(s): GLUCAP in the last 168 hours. Lipid Profile: No results for input(s): CHOL, HDL, LDLCALC, TRIG, CHOLHDL, LDLDIRECT in the last 72 hours. Thyroid Function Tests: Recent Labs    04/23/17 2029  04/26/17 0439  TSH 96.129*  --   --   FREET4 <0.25*   < > 0.37*   < > = values in this interval not displayed.   Anemia Panel: No results for input(s): VITAMINB12, FOLATE, FERRITIN, TIBC, IRON, RETICCTPCT in the last 72 hours. Sepsis Labs: Recent Labs  Lab 04/23/17 0006  LATICACIDVEN 1.58    Recent Results (from the past 240 hour(s))  MRSA PCR Screening     Status: None   Collection Time: 04/25/17  8:02 AM  Result Value Ref Range Status   MRSA by PCR NEGATIVE NEGATIVE Final    Comment:        The  GeneXpert MRSA Assay (FDA approved for NASAL specimens only), is one component of Elyas Villamor comprehensive MRSA colonization surveillance program. It is not intended to diagnose MRSA infection nor to guide or monitor treatment for MRSA infections. Performed at [facility], [address] [place], [place] [address]          Radiology Studies: No results found.      Scheduled Meds: . bictegravir-emtricitabine-tenofovir AF  1 tablet Oral Daily  . dapsone  100 mg Oral Daily  . enoxaparin (LOVENOX) injection  40 mg Subcutaneous Q24H  . furosemide  40 mg Intravenous Daily  . hydrocortisone sod succinate (SOLU-CORTEF) inj  50 mg Intravenous Q8H  . imiquimod   Topical Once per day on Mon Wed Fri  . itraconazole  200 mg Oral Daily  . levothyroxine  100 mcg Intravenous Daily  . sodium chloride flush  3 mL Intravenous Q12H  . sodium chloride  2 g Oral TID WC   Continuous Infusions: . sodium chloride       LOS: 3 days    Time spent: over 30 min    Lacretia Nicks, MD [facility] Pager [phone]  If 7PM-7AM, please contact night-coverage [email or web address] Password [facility] 04/26/2017, 3:26 PM

## 2017-04-26 NOTE — Progress Notes (Signed)
[facility] Note:   Lab Results  Component Value Date   CD4TCELL 15 (L) 04/25/2017   CD4TABS 70 (L) 04/25/2017   CD4 count returned at 70. Overall percentage is OK and Viral Load is still pending ?Related to steroids he is getting for his hypothyroidism/myxedmea. Urine histo antigen negative. Will resume Itraconazole 200 mg QD for secondary prophylaxis with previously disseminated histoplasmosis. Start Dapsone 100 mg QD   QTc interval on 4/24 normal @ 410 ms   Updated Dr. Daiva EvesVan Dam  Rexene AlbertsStephanie Gregery Walberg, MSN, NP-C [facility] Office: [phone] Pager: [phone]  04/26/2017  9:00 AM

## 2017-04-27 ENCOUNTER — Inpatient Hospital Stay (HOSPITAL_COMMUNITY): Payer: Self-pay

## 2017-04-27 DIAGNOSIS — R609 Edema, unspecified: Secondary | ICD-10-CM

## 2017-04-27 LAB — COMPREHENSIVE METABOLIC PANEL
ALT: 45 U/L (ref 17–63)
AST: 109 U/L — ABNORMAL HIGH (ref 15–41)
Albumin: 4.1 g/dL (ref 3.5–5.0)
Alkaline Phosphatase: 51 U/L (ref 38–126)
Anion gap: 9 (ref 5–15)
BUN: 13 mg/dL (ref 6–20)
CHLORIDE: 86 mmol/L — AB (ref 101–111)
CO2: 34 mmol/L — ABNORMAL HIGH (ref 22–32)
CREATININE: 1.37 mg/dL — AB (ref 0.61–1.24)
Calcium: 9.2 mg/dL (ref 8.9–10.3)
GFR calc non Af Amer: >60 mL/min (ref >60)
Glucose, Bld: 96 mg/dL (ref 65–99)
POTASSIUM: 3.8 mmol/L (ref 3.5–5.1)
Sodium: 129 mmol/L — ABNORMAL LOW (ref 135–145)
Total Bilirubin: 1.1 mg/dL (ref 0.3–1.2)
Total Protein: 7.4 g/dL (ref 6.5–8.1)

## 2017-04-27 LAB — CBC
HCT: 36.8 % — ABNORMAL LOW (ref 39.0–52.0)
Hemoglobin: 12.4 g/dL — ABNORMAL LOW (ref 13.0–17.0)
MCH: 31.2 pg (ref 26.0–34.0)
MCHC: 33.7 g/dL (ref 30.0–36.0)
MCV: 92.5 fL (ref 78.0–100.0)
PLATELETS: 210 10*3/uL (ref 150–400)
RBC: 3.98 MIL/uL — ABNORMAL LOW (ref 4.22–5.81)
RDW: 14.6 % (ref 11.5–15.5)
WBC: 7.1 10*3/uL (ref 4.0–10.5)

## 2017-04-27 LAB — MAGNESIUM: MAGNESIUM: 2.2 mg/dL (ref 1.7–2.4)

## 2017-04-27 LAB — T4, FREE: Free T4: 0.34 ng/dL — ABNORMAL LOW (ref 0.82–1.77)

## 2017-04-27 MED ORDER — FUROSEMIDE 40 MG PO TABS: 40.0000 mg | ORAL_TABLET | Freq: Every day | ORAL | 0 refills | Status: DC

## 2017-04-27 MED ORDER — DAPSONE 100 MG PO TABS: 100.0000 mg | ORAL_TABLET | Freq: Every day | ORAL | 0 refills | Status: AC

## 2017-04-27 MED ORDER — DAPSONE 100 MG PO TABS: 100.0000 mg | ORAL_TABLET | Freq: Every day | ORAL | 0 refills | Status: DC

## 2017-04-27 MED ORDER — HYDROCORTISONE 10 MG PO TABS: 10.0000 mg | ORAL_TABLET | Freq: Two times a day (BID) | ORAL | 0 refills | Status: DC

## 2017-04-27 MED ORDER — HYDROCORTISONE 10 MG PO TABS: 10.0000 mg | ORAL_TABLET | Freq: Two times a day (BID) | ORAL | 0 refills | Status: AC

## 2017-04-27 MED ORDER — LEVOTHYROXINE SODIUM 200 MCG PO TABS: 200.0000 ug | ORAL_TABLET | Freq: Every day | ORAL | 0 refills | Status: DC

## 2017-04-27 MED ORDER — ITRACONAZOLE 100 MG PO CAPS: 200.0000 mg | ORAL_CAPSULE | Freq: Every day | ORAL | 0 refills | Status: DC

## 2017-04-27 MED ORDER — ITRACONAZOLE 100 MG PO CAPS: 200.0000 mg | ORAL_CAPSULE | Freq: Every day | ORAL | 0 refills | Status: AC

## 2017-04-27 NOTE — Progress Notes (Signed)
[facility] NEPHROLOGY PROGRESS NOTE  Assessment/ Plan: Pt is a 37 y.o. yo male HIV on medication, hypothyroidism, viral warts,history of disseminated histoplasmosis,  CKD with serum creatinine level around 1.5, presented with worsening lower extremity edema and weight gain.  Patient also drinks 2-3 bottles of beer every day on average.  Consulted for the management of hyponatremia.   Patient with TSH level of >90 on admission.  Assessment/Plan:  #Chronic hyponatremia in the setting of severe hypothyroidism and beer intake.  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 -Serum sodium level improved to 129 today.  Recommended to switch Lasix to oral.  Renal function is stable.  Recommend fluid restriction and follow-up lab in a week with PCP.  His mental status is improved.  Okay to discharge.   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  Lasix for edema management.  #CKD stage II/III: On reviewing patient's medical record, he has elevated serum creatinine level since 2016 ranging from 1.3-1.7.  Stable.  I will sign off for now.  Please call us with question.  Discussed with primary team.  Subjective: Seen and examined at bedside.  No nausea vomiting or headache.  No chest pain or shortness of breath. \Objective Vital signs in last 24 hours: Vitals:   04/26/17 1703 04/27/17 0056 04/27/17 0654 04/27/17 0738  BP: (!) 84/44 (!) 97/45  (!) 81/44  Pulse:  (!) 57  71  Resp: 20   (!) 22  Temp: 98.6 F (37 C) 98.4 F (36.9 C)  97.8 F (36.6 C)  TempSrc: Oral Oral  Oral  SpO2: 97% 94%  (!) 79%  Weight:   113.5 kg (250 lb 3.6 oz)   Height:       Weight change: -3 kg (-6 lb 9.8 oz)  Intake/Output Summary (Last 24 hours) at 04/27/2017 1300 Last data filed at 04/27/2017 1000 Gross per 24 hour  Intake 0 ml  Output 350 ml  Net -350 ml       Labs: Basic Metabolic  Panel: Recent Labs  Lab 04/26/17 0439 04/26/17 1650 04/27/17 0601  NA 123* 126* 129*  K 4.1 4.1 3.8  CL 85* 84* 86*  CO2 30 32 34*  GLUCOSE 109* 108* 96  BUN 10 12 13   CREATININE 1.08 1.43* 1.37*  CALCIUM 8.8* 9.1 9.2   Liver Function Tests: Recent Labs  Lab 04/25/17 1656 04/26/17 0439 04/27/17 0601  AST 147* 134* 109*  ALT 49 50 45  ALKPHOS 51 54 51  BILITOT 1.0 1.0 1.1  PROT 7.1 7.3 7.4  ALBUMIN 3.9 4.1 4.1   Recent Labs  Lab 04/23/17 0001  LIPASE 30   Recent Labs  Lab 04/23/17 0001  AMMONIA 34   CBC: Recent Labs  Lab 04/23/17 0001 04/24/17 0657 04/25/17 0230 04/26/17 0439 04/27/17 0601  WBC 4.7 4.2 5.9 6.8 7.1  NEUTROABS 3.7 3.0  --   --   --   HGB 12.7* 12.3* 12.8* 12.7* 12.4*  HCT 36.9* 36.6* 36.4* 36.7* 36.8*  MCV 90.9 90.8 89.2 90.4 92.5  PLT 185 187 202 231 210   Cardiac Enzymes: Recent Labs  Lab 04/23/17 2029  TROPONINI <0.03   CBG: No results for input(s): GLUCAP in the last 168 hours.  Iron Studies: No results for input(s): IRON, TIBC, TRANSFERRIN, FERRITIN in the last 72 hours. Studies/Results: No results found.  Medications: Infusions: . sodium chloride  Scheduled Medications: . bictegravir-emtricitabine-tenofovir AF  1 tablet Oral Daily  . dapsone  100 mg Oral Daily  . enoxaparin (LOVENOX) injection  40 mg Subcutaneous Q24H  . furosemide  40 mg Intravenous Daily  . hydrocortisone  10 mg Oral BID  . imiquimod   Topical Once per day on Mon Wed Fri  . itraconazole  200 mg Oral Daily  . levothyroxine  100 mcg Intravenous Daily  . sodium chloride flush  3 mL Intravenous Q12H    have reviewed scheduled and prn medications.  Physical Exam: General: Not in distress Heart: Regular rate rhythm S1-S2 normal Lungs: Distant lung sounds, no wheezing Abdomen: Soft Extremities: Bilateral lower extremity edema with thickened skin   Dron Prasad Bhandari 04/27/2017,1:00 PM  LOS: 4 days

## 2017-04-27 NOTE — Progress Notes (Signed)
Preliminary notes--Bilateral lower extremities venous study completed. Negative for DVT and superficial veins thrombosis.   David Knapp (RDMS RVT) 04/27/17 11:01 AM

## 2017-04-27 NOTE — Discharge Summary (Signed)
Physician Discharge Summary  David Knapp RUE:[id number] DOB: [birth year] DOA: 04/23/2017  PCP: Patient, No Pcp Per  Admit date: 04/23/2017 Discharge date: 04/27/2017  Time spent: 35 minutes  Recommendations for Outpatient Follow-up:  1. Follow up outpatient CMP/CBC (attention to sodium and creatinine with lasix) 2. Follow up with Dr. Sharl Ma as outpatient, pt will need to reschedule this visit) 3. Follow up TSH as outpatient 4. Will need follow up testing for adrenal insufficiency as outpatient 5. Bradycardia likely related to hypothyroidism, continue to follow 6. Follow up BP's as outpatient.  BP's soft on day of d/c, but pt asymptomatic.  Follow up long term plan for lasix as outpatient (follow weight, sodium, creatinine, BP's) 7. Patient needs sleep study as an outpatient 8. Started on itraconazole and dapsone as inpatient, follow with ID 9. Follow HIV RNA quant 10. Elevated liver enzymes here, this seems chronic, would continue to follow outpatient and consider RUQ [place].  Repeat acute hepatitis panel done here and pending. 11. Follow final LE [place] report  Discharge Diagnoses:  Principal Problem:   Peripheral edema Active Problems:   HIV disease (HCC)   Hypothyroidism   Hyponatremia   CKD (chronic kidney disease), stage III [facility])   Discharge Condition: stable  Filed Weights   04/25/17 0500 04/26/17 0500 04/27/17 0654  Weight: 116.8 kg (257 lb 8 oz) 116.5 kg (256 lb 13.4 oz) 113.5 kg (250 lb 3.6 oz)    History of present illness:  37 y.o.malewith medical history significant ofHIV, CKD stage II,HFpEF,histoplasmosis, and pancytopenia; who presents with complaints of progressive swelling over the last several months.Patient was sent as a direct admission from ID clinic, giving abnormal labs including hyponatremia, and progressive swelling no response to steroids and diuresis,by reviewing workup, she had abnormal TSH hormone couple years ago, not on any Synthroid supplement, and  was noted to have significant fluid retention, bradycardia, hyponatremia, workup significant for low free T4, and significantly elevated TSH  He was treated for hypothyroidism with IV synthroid.  He was started on IV hydrocortisone in case of adrenal insufficiency.  He was started on salt tabs and IV lasix for his hyponatremia.  He gradually improved and was discharged with PO synthroid, hydrocortisone, and lasix with plans for outpatient follow up.  His CD4 count was noted to be low and he was started on dapsone and itraconazole.    Hospital Course:  Hypothyroidism/myxedema - Pt initially drowsy and with diffuse edema.  At discharge, improved with more energy and improving edema.  - TSH 96 and free T4 <0.25  - UA without proteinuria - Appreciate Dr. Daune Perch assistance - S/p 200 mcg IV synthroid followed by 100 mcg IV daily.  Discharged on 200 mcg daily.   - Started on hydrocortisone 50 mg IV q 8 hrs.  Discharged on 10 mg BID hydrocortisone.  Plan for evaluation for adrenal insufficiency as outpatient - Echo with normal LV function, small pericardial effusion - ctm swelling with treatment of hypothyroidism  - daily Free T4 (improving), consider repeating TSH in about 1 week - LE [place] pending (prelim negative for DVT)  Hyponatremia - Likely 2/2 hypothyroidism above - urine sodium 146, urine osm 568.   Serum osm low to 259.  - Nephrology c/s - Improved with salt tabs, fluid restriction, and lasix.  Discharged with PO lasix.  Will need to follow up with PCP for this.   Bradycardiawith first-degree heart block: -Likely 2/2 hypothyroidism, currently pt asymptomatic, hemodynamically stable.   - continue telemetry, stable  Likely  OSA - Pt desats at night.  On room air last night.  Needs sleep study.     History of histoplasmosis -Follow-up urine histoplasmosis (negative)  HIV: -Patient reportstaking his antiretroviral medications as prescribed.  - CD4 count 70 -> starting on  dapsone and itraconazole - HIV RNA quant pending -ContinueBiktarvy -Continue outpatient follow-up  Elevated AST: -Acute on chronic.Patient presented with a AST of 164 with ALT 54. Baseline previously noted to be around 120 during last check on 11/15/2016.  -Check GGT(wnl) - persistently elevated.  Will recheck viral hepatitis studies.   H/O HFpEF: -Patient's last EF was noted to be 65-70% on 04/29/2015. - Repeat echo with normal EF - continue to monitor  Warts -Continue cream  Chronic kidney disease stage II: -Creatinine 1.37 at d/c, better than baseline, but nadired to 0.99 here. - continue to monitor  Anemia: stable, chronic, follow   Procedures: Echo 4/23  Study Conclusions  - Left ventricle: The cavity size was normal. Wall thickness was   normal. Systolic function was normal. The estimated ejection   fraction was in the range of 55% to 60%. Wall motion was normal;   there were no regional wall motion abnormalities. The study is   not technically sufficient to allow evaluation of LV diastolic   function. - Pericardium, extracardiac: A small pericardial effusion was   identified.  Impressions:  - Normal LV systolic function; small pericardial effusion.   Consultations:  Nephrology  Endocrine  Discharge Exam: Vitals:   04/27/17 0056 04/27/17 0738  BP: (!) 97/45 (!) 81/44  Pulse: (!) 57 71  Resp:  (!) 22  Temp: 98.4 F (36.9 C) 97.8 F (36.6 C)  SpO2: 94% (!) 79%   Feeling well.  No complaints. Interpreter used.   General: No acute distress. Cardiovascular: Heart sounds show a regular rate, and rhythm. No gallops or rubs. No murmurs. No JVD. Lungs: Clear to auscultation bilaterally with good air movement. No rales, rhonchi or wheezes. Abdomen: Soft, nontender, nondistended with normal active bowel sounds. No masses. No hepatosplenomegaly. Neurological: Alert and oriented 3. Moves all extremities 4. Cranial nerves II through  XII grossly intact. Skin: Warm and dry. Verucous lesions to UE bilaterally.  Extremities: No clubbing or cyanosis. Diffuse edema.  Improving Psychiatric: Mood and affect are normal. Insight and judgment are appropriate.   Discharge Instructions   Discharge Instructions    Call MD for:  difficulty breathing, headache or visual disturbances   Complete by:  As directed    Call MD for:  persistant dizziness or light-headedness   Complete by:  As directed    Call MD for:  persistant nausea and vomiting   Complete by:  As directed    Call MD for:  redness, tenderness, or signs of infection (pain, swelling, redness, odor or green/yellow discharge around incision site)   Complete by:  As directed    Call MD for:  severe uncontrolled pain   Complete by:  As directed    Call MD for:  temperature >100.4   Complete by:  As directed    Diet - low sodium heart healthy   Complete by:  As directed    Discharge instructions   Complete by:  As directed    You were seen for swelling and hypothyroidism.    We started synthroid for you (a thyroid medicine) and hydrocortisone (a steroid).  Your sodium was also low.  This improved with lasix and your swelling also improved with lasix.   We started  you on 2 new medicines for your HIV because your CD4 count was low (itraconazole and dapsone).  Please follow up with Dr. Daiva Eves within the week for repeat labs and to follow up.  Please follow up with Dr. Sharl Ma from endocrinology next week for repeat labs and to follow up your medications.    Please limit your fluid intake to about 1.5 L daily until you follow up for labs next week.  Return if you have new, recurrent, or worsening symptoms.  Please ask your PCP to request records from this hospitalization so they know what was done and what the next steps will be.   Increase activity slowly   Complete by:  As directed      Allergies as of 04/27/2017      Reactions   Sulfa Antibiotics Other (See  Comments)   He believes he has experienced throat swelling with prezcobix and Descovy for nearly a year since April 2017 to present March 22, 2016      Medication List    TAKE these medications   bictegravir-emtricitabine-tenofovir AF 50-200-25 MG Tabs tablet Commonly known as:  BIKTARVY Take 1 tablet daily by mouth.   dapsone 100 MG tablet Take 1 tablet (100 mg total) by mouth daily. Start taking on:  04/28/2017   furosemide 40 MG tablet Commonly known as:  LASIX Take 1 tablet (40 mg total) by mouth daily.   hydrocortisone 10 MG tablet Commonly known as:  CORTEF Take 1 tablet (10 mg total) by mouth 2 (two) times daily.   hydrOXYzine 10 MG tablet Commonly known as:  ATARAX/VISTARIL Take 1 tablet (10 mg total) by mouth 3 (three) times daily as needed.   imiquimod 5 % cream Commonly known as:  ALDARA Apply 3 (three) times a week topically.   itraconazole 100 MG capsule Commonly known as:  SPORANOX Take 2 capsules (200 mg total) by mouth daily. Start taking on:  04/28/2017   levothyroxine 200 MCG tablet Commonly known as:  SYNTHROID Take 1 tablet (200 mcg total) by mouth daily.      Allergies  Allergen Reactions  . Sulfa Antibiotics Other (See Comments)    He believes he has experienced throat swelling with prezcobix and Descovy for nearly a year since April 2017 to present March 22, 2016   Follow-up Information    Daiva Eves, Lisette Grinder, MD Follow up.   Specialty:  Infectious Diseases Why:  Please call for an appointment next week Contact information: [address] [place] [address] [phone]        Talmage Coin, MD Follow up.   Specialty:  Endocrinology Why:  Please call for an appointment next week Contact information: [address] [place] [address] [phone]            The results of significant diagnostics from this hospitalization (including imaging, microbiology, ancillary and laboratory) are listed below  for reference.    Significant Diagnostic Studies: Dg Chest 2 View  Result Date: 04/23/2017 CLINICAL DATA:  Diffuse edema EXAM: CHEST - 2 VIEW COMPARISON:  04/28/2015 FINDINGS: Low lung volumes with mild bibasilar atelectasis. Mild cardiomegaly with minimal central vascular congestion. No pleural effusion. No pneumothorax. IMPRESSION: 1. Low lung volumes with minimal basilar atelectasis 2. Mild cardiomegaly with slight central vascular congestion Electronically Signed   By: Jasmine Pang M.D.   On: 04/23/2017 00:29   Ct Head Wo Contrast  Result Date: 04/23/2017 CLINICAL DATA:  Altered LOC EXAM: CT HEAD WITHOUT  CONTRAST TECHNIQUE: Contiguous axial images were obtained from the base of the skull through the vertex without intravenous contrast. COMPARISON:  05/21/2012 FINDINGS: Brain: No acute territorial infarction, hemorrhage or intracranial mass. Normal ventricle size. Vascular: No hyperdense vessels.  No unexpected calcification Skull: Normal. Negative for fracture or focal lesion. Sinuses/Orbits: Mild mucosal thickening in the ethmoid and maxillary sinuses. Retention cysts in the right maxillary sinus. Other: Periorbital soft tissue swelling and soft tissue swelling over the forehead. IMPRESSION: 1. Negative non contrasted CT appearance of the brain 2. Periorbital and forehead soft tissue swelling Electronically Signed   By: Jasmine Pang M.D.   On: 04/23/2017 00:08    Microbiology: Recent Results (from the past 240 hour(s))  MRSA PCR Screening     Status: None   Collection Time: 04/25/17  8:02 AM  Result Value Ref Range Status   MRSA by PCR NEGATIVE NEGATIVE Final    Comment:        The GeneXpert MRSA Assay (FDA approved for NASAL specimens only), is one component of a comprehensive MRSA colonization surveillance program. It is not intended to diagnose MRSA infection nor to guide or monitor treatment for MRSA infections. Performed at [facility], [address], [place],  [place] [address]      Labs: Basic Metabolic Panel: Recent Labs  Lab 04/25/17 0230 04/25/17 1216 04/25/17 1656 04/26/17 0439 04/26/17 1650 04/27/17 0601  NA 120* 121* 122* 123* 126* 129*  K 3.7 3.6 4.0 4.1 4.1 3.8  CL 82* 80* 82* 85* 84* 86*  CO2 30 31 31 30  32 34*  GLUCOSE 117* 108* 96 109* 108* 96  BUN 8 8 10 10 12 13   CREATININE 1.11 1.18 1.23 1.08 1.43* 1.37*  CALCIUM 8.7* 8.8* 8.9 8.8* 9.1 9.2  MG 2.0  --   --   --  2.0 2.2   Liver Function Tests: Recent Labs  Lab 04/23/17 0001 04/24/17 0657 04/25/17 1656 04/26/17 0439 04/27/17 0601  AST 164* 171* 147* 134* 109*  ALT 54 50 49 50 45  ALKPHOS 58 50 51 54 51  BILITOT 1.2 1.2 1.0 1.0 1.1  PROT 8.0 7.2 7.1 7.3 7.4  ALBUMIN 4.5 4.0 3.9 4.1 4.1   Recent Labs  Lab 04/23/17 0001  LIPASE 30   Recent Labs  Lab 04/23/17 0001  AMMONIA 34   CBC: Recent Labs  Lab 04/23/17 0001 04/24/17 0657 04/25/17 0230 04/26/17 0439 04/27/17 0601  WBC 4.7 4.2 5.9 6.8 7.1  NEUTROABS 3.7 3.0  --   --   --   HGB 12.7* 12.3* 12.8* 12.7* 12.4*  HCT 36.9* 36.6* 36.4* 36.7* 36.8*  MCV 90.9 90.8 89.2 90.4 92.5  PLT 185 187 202 231 210   Cardiac Enzymes: Recent Labs  Lab 04/23/17 2029  TROPONINI <0.03   BNP: BNP (last 3 results) Recent Labs    04/23/17 0001  BNP 38.7    ProBNP (last 3 results) No results for input(s): PROBNP in the last 8760 hours.  CBG: No results for input(s): GLUCAP in the last 168 hours.     Signed:  Lacretia Nicks MD.  [facility] 04/27/2017, 11:53 AM

## 2017-04-27 NOTE — Care Management Note (Addendum)
Case Management Note  Patient Details  Name: David Knapp MRN: [id number] Date of Birth: 17-May-1980  Subjective/Objective:  From home with family, presents with hypothyroidism, 042, worsening edema and weight gain, he follows up with ID for medications, he is for dc today, he will be on synthroid 200mg  daily which is $4 , lasix 40mg  daily $4, hydro cortisone 10mg  bid  For 30 pills is 17.81, but for 60 pills it is 34.42.  NCM will give this information to patient, David Knapp is on her way up to interpret.  [facility]                Action/Plan: DC home when ready.  Expected Discharge Date:  04/27/17               Expected Discharge Plan:  Home/Self Care  In-House Referral:     Discharge planning Services  CM Consult  Post Acute Care Choice:    Choice offered to:     DME Arranged:    DME Agency:     HH Arranged:    HH Agency:     Status of Service:  Completed, signed off  If discussed at [employer] Length of Stay Meetings, dates discussed:    Additional Comments:  David Knapp, David Jessop Clinton, RN 04/27/2017, 12:47 PM

## 2017-04-27 NOTE — Progress Notes (Signed)
Se habl con el paciente/cuidador sobre las instrucciones para el alta (incluyendo medicamentos) y se le proporcion una copia.  Discharge instructions translated by Ashby DawesGraciela, interpreter at bedside. Questions answered. ID [facility]. Paper prescriptions given for all other medications. Patient transported to his car by wheelchair. Stated he will drive himself home.

## 2017-04-28 LAB — HEPATITIS PANEL, ACUTE
HCV AB: 0.3 {s_co_ratio} (ref 0.0–0.9)
HEP A IGM: NEGATIVE
HEP B S AG: NEGATIVE
Hep B C IgM: NEGATIVE

## 2017-04-30 LAB — GLUCOSE 6 PHOSPHATE DEHYDROGENASE: G6PDH: UNDETERMINED U/g{Hb}

## 2017-05-23 ENCOUNTER — Inpatient Hospital Stay: Payer: Self-pay | Admitting: Infectious Disease

## 2017-05-30 ENCOUNTER — Other Ambulatory Visit: Payer: Self-pay

## 2017-05-30 DIAGNOSIS — B20 Human immunodeficiency virus [HIV] disease: Secondary | ICD-10-CM

## 2017-05-31 LAB — RPR: RPR: NONREACTIVE

## 2017-05-31 LAB — COMPLETE METABOLIC PANEL WITH GFR
AG RATIO: 1.6 (calc) (ref 1.0–2.5)
ALBUMIN MSPROF: 4.2 g/dL (ref 3.6–5.1)
ALKALINE PHOSPHATASE (APISO): 92 U/L (ref 40–115)
ALT: 59 U/L — ABNORMAL HIGH (ref 9–46)
AST: 38 U/L (ref 10–40)
BILIRUBIN TOTAL: 0.8 mg/dL (ref 0.2–1.2)
BUN: 17 mg/dL (ref 7–25)
CHLORIDE: 102 mmol/L (ref 98–110)
CO2: 28 mmol/L (ref 20–32)
Calcium: 8.7 mg/dL (ref 8.6–10.3)
Creat: 1.31 mg/dL (ref 0.60–1.35)
GFR, EST AFRICAN AMERICAN: 79 mL/min/{1.73_m2} (ref >=60)
GFR, Est Non African American: 69 mL/min/{1.73_m2} (ref >=60)
Globulin: 2.6 g/dL (calc) (ref 1.9–3.7)
Glucose, Bld: 92 mg/dL (ref 65–99)
POTASSIUM: 4 mmol/L (ref 3.5–5.3)
Sodium: 137 mmol/L (ref 135–146)
TOTAL PROTEIN: 6.8 g/dL (ref 6.1–8.1)

## 2017-05-31 LAB — LIPID PANEL
CHOL/HDL RATIO: 4.6 (calc) (ref <5.0)
Cholesterol: 111 mg/dL (ref <200)
HDL: 24 mg/dL — ABNORMAL LOW (ref >40)
LDL CHOLESTEROL (CALC): 56 mg/dL
Non-HDL Cholesterol (Calc): 87 mg/dL (calc) (ref <130)
TRIGLYCERIDES: 264 mg/dL — AB (ref <150)

## 2017-05-31 LAB — CBC WITH DIFFERENTIAL/PLATELET
BASOS ABS: 19 {cells}/uL (ref 0–200)
Basophils Relative: 0.6 %
EOS PCT: 6.2 %
Eosinophils Absolute: 198 cells/uL (ref 15–500)
HEMATOCRIT: 37.2 % — AB (ref 38.5–50.0)
Hemoglobin: 12.2 g/dL — ABNORMAL LOW (ref 13.2–17.1)
LYMPHS ABS: 982 {cells}/uL (ref 850–3900)
MCH: 30.2 pg (ref 27.0–33.0)
MCHC: 32.8 g/dL (ref 32.0–36.0)
MCV: 92.1 fL (ref 80.0–100.0)
MONOS PCT: 14.2 %
MPV: 9.9 fL (ref 7.5–12.5)
NEUTROS PCT: 48.3 %
Neutro Abs: 1546 cells/uL (ref 1500–7800)
Platelets: 174 10*3/uL (ref 140–400)
RBC: 4.04 10*6/uL — ABNORMAL LOW (ref 4.20–5.80)
RDW: 14.1 % (ref 11.0–15.0)
Total Lymphocyte: 30.7 %
WBC mixed population: 454 cells/uL (ref 200–950)
WBC: 3.2 10*3/uL — ABNORMAL LOW (ref 3.8–10.8)

## 2017-05-31 LAB — T-HELPER CELL (CD4) - (RCID CLINIC ONLY)
CD4 % Helper T Cell: 9 % — ABNORMAL LOW (ref 33–55)
CD4 T CELL ABS: 100 /uL — AB (ref 400–2700)

## 2017-06-01 ENCOUNTER — Telehealth: Payer: Self-pay | Admitting: Infectious Disease

## 2017-06-01 ENCOUNTER — Other Ambulatory Visit: Payer: Self-pay

## 2017-06-01 DIAGNOSIS — B20 Human immunodeficiency virus [HIV] disease: Secondary | ICD-10-CM

## 2017-06-01 NOTE — Telephone Encounter (Signed)
Thanks Minh! 

## 2017-06-01 NOTE — Telephone Encounter (Signed)
Talked to him on the phone today through a translator and he claimed to be taking meds. Called [facility] to check on refill history. They said that it was recently filled on 5/25 which was before the visit on the 29th. However, the previous fill before was in January and November. He is definitely not telling the truth. Told [facility] to add the reflex version to his VL. He has an upcoming appt with Dr. Daiva EvesVan Dam on 6/7.

## 2017-06-01 NOTE — Telephone Encounter (Signed)
His ADAP is approved til Sept.

## 2017-06-01 NOTE — Telephone Encounter (Signed)
Can we add genotype for INSTI, NRTI to his labs  I put order in  Also assuming his HMAP lapsed.  Could we give him a 30 day bottle of SYMTUZA in meantime?

## 2017-06-08 ENCOUNTER — Ambulatory Visit: Payer: Self-pay | Admitting: Infectious Disease

## 2017-06-14 LAB — HIV-1 GENOTYPING (RTI,PI,IN INHBTR): HIV-1 GENOTYPE: DETECTED — AB

## 2017-06-14 LAB — HIV-1 RNA QUANT-NO REFLEX-BLD
HIV 1 RNA QUANT: 283000 {copies}/mL — AB
HIV-1 RNA QUANT, LOG: 5.45 {Log_copies}/mL — AB

## 2017-06-14 LAB — TEST AUTHORIZATION

## 2017-09-25 ENCOUNTER — Inpatient Hospital Stay: Payer: Self-pay | Admitting: Infectious Disease

## 2017-10-12 ENCOUNTER — Other Ambulatory Visit: Payer: Self-pay

## 2017-10-12 ENCOUNTER — Encounter: Payer: Self-pay | Admitting: Infectious Disease

## 2017-10-12 DIAGNOSIS — B20 Human immunodeficiency virus [HIV] disease: Secondary | ICD-10-CM

## 2017-10-13 LAB — MICROALBUMIN / CREATININE URINE RATIO
Creatinine, Urine: 209 mg/dL (ref 20–320)
MICROALB UR: 1.3 mg/dL
MICROALB/CREAT RATIO: 6 ug/mg{creat} (ref <30)

## 2017-10-15 LAB — URINE CYTOLOGY ANCILLARY ONLY
CHLAMYDIA, DNA PROBE: NEGATIVE
NEISSERIA GONORRHEA: NEGATIVE

## 2017-10-23 LAB — HIV-1 GENOTYPING (RTI,PI,IN INHBTR): HIV-1 GENOTYPE: NOT DETECTED

## 2017-11-21 ENCOUNTER — Encounter: Payer: Self-pay | Admitting: Infectious Disease

## 2017-11-21 ENCOUNTER — Ambulatory Visit (INDEPENDENT_AMBULATORY_CARE_PROVIDER_SITE_OTHER): Payer: Self-pay | Admitting: Infectious Disease

## 2017-11-21 VITALS — BP 128/85 | HR 85 | Temp 98.6°F | Wt 254.0 lb

## 2017-11-21 DIAGNOSIS — Z23 Encounter for immunization: Secondary | ICD-10-CM

## 2017-11-21 DIAGNOSIS — B20 Human immunodeficiency virus [HIV] disease: Secondary | ICD-10-CM

## 2017-11-21 DIAGNOSIS — E039 Hypothyroidism, unspecified: Secondary | ICD-10-CM

## 2017-11-21 MED ORDER — LEVOTHYROXINE SODIUM 50 MCG PO TABS: 50.0000 ug | ORAL_TABLET | Freq: Every day | ORAL | 3 refills | Status: DC

## 2017-11-21 NOTE — Progress Notes (Signed)
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   He had been previously treated with PREZCOBIX and DESCOVY with nice virological suppression recently changed to [facility] with continued suppression.     Spring he became viremic above 200,000 when there was a lapse in his medications which sounds like it was due to a misunderstanding at the pharmacy according to what he is telling me today.  He seems likely re-suppressed based on the fact that the genotype did not run though I do not have a formal viral load to look at.  He had been admitted yet again for hypothyroidism this spring and placed on Synthroid 200 mcg daily.  I thought he was not taking it but further investigation by our infectious disease pharmacy found that he was indeed on this medication and so this will be continued while he check a TSH today.  .    Past Medical History:  Diagnosis Date  . Allergic reaction 03/22/2016  . Change in voice 03/22/2016  . CHF (congestive heart failure) (HCC) 04/23/2017  . CKD (chronic kidney disease), stage II   . 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 Possible recurrence 2014 (currently being evaluated 05/2012)  . Edema 04/27/2015  . HIV (human immunodeficiency virus infection) (HCC)   . Hypothyroidism 04/24/2017  . Leg swelling 04/2017  . Pancytopenia (HCC)   . 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 Throat swelling 03/22/2016    Past Surgical History:  Procedure Laterality Date  . NO PAST SURGERIES      Family History  Problem Relation Age of Onset  . Coronary artery disease Mother        died from MI @ age 23 yo      Social History   Socioeconomic History  . Marital status: Single    Spouse name:  Not on file  . Number of children: 0  . Years of education: 6th grade  . Highest education level: Not on file  Occupational History  . 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 Financial resource strain: Not on file  . Food insecurity:    Worry: Not on file    Inability: Not on file  . Transportation needs:    Medical: Not on file    Non-medical: Not on file  Tobacco Use  . Smoking status: Never Smoker  . Smokeless tobacco: Never Used  Substance and Sexual Activity  . Alcohol use: No    Alcohol/week: 6.0 standard drinks    Types: 6 Standard drinks or equivalent per week  . Drug use: No  . Sexual activity: Yes    Partners: Female    Comment: given condoms  Lifestyle  . Physical activity:    Days per week: Not on file    Minutes per session: Not on file  . Stress: Not on file  Relationships  . Social connections:    Talks on phone: Not on file    Gets together: Not on file    Attends religious service: Not on file    Active member of club or organization: Not on file    Attends meetings of clubs or organizations: Not on file    Relationship status: Not on file  Other Topics Concern  . Not on file  Social History Narrative   [place] with his wife and 3 nephews/nieces. Is spanish speaking only - requiring interpretor. He is able to read and write in Spanish only    Allergies  Allergen Reactions  . Sulfa Antibiotics Other (See Comments)    He believes he has experienced throat swelling with prezcobix and Descovy for nearly a year since April 2017 to present March 22, 2016     Current Outpatient Medications:  .  bictegravir-emtricitabine-tenofovir AF (BIKTARVY) 50-200-25 MG TABS tablet, Take 1 tablet daily by mouth., Disp: 30 tablet, Rfl: 11 .  levothyroxine (SYNTHROID, LEVOTHROID) 50 MCG tablet, Take 1 tablet (50 mcg total) by mouth daily before breakfast., Disp: 30 tablet, Rfl: 3   Review of Systems  Constitutional: Negative  for appetite change, chills, diaphoresis and fever.  HENT: Negative for congestion, rhinorrhea, sinus pressure, sneezing, sore throat, trouble swallowing and voice change.   Eyes: Negative for photophobia and visual disturbance.  Respiratory: Negative for chest tightness, shortness of breath, wheezing and stridor.   Cardiovascular: Negative for chest pain and palpitations.  Gastrointestinal: Negative for abdominal distention, abdominal pain, anal bleeding, blood in stool, constipation, diarrhea, nausea and vomiting.  Genitourinary: Negative for difficulty urinating, dysuria, flank pain and hematuria.  Musculoskeletal: Negative for arthralgias, back pain, gait problem, joint swelling and myalgias.  Skin: Positive for rash. Negative for pallor and wound.  Neurological: Negative for dizziness, tremors, weakness and light-headedness.  Hematological: Negative for adenopathy. Does not bruise/bleed easily.  Psychiatric/Behavioral: Negative for agitation, behavioral problems, confusion, decreased concentration, dysphoric mood and sleep disturbance.     Physical Exam  Constitutional: He is oriented to person, place, and time. He appears well-nourished. No distress.  HENT:  Head: Normocephalic and atraumatic.  Mouth/Throat: Oropharynx is clear and moist. No oropharyngeal exudate.  Eyes: Conjunctivae and EOM are normal. No scleral icterus.  Neck: Normal range of motion. Neck supple.  Cardiovascular: Normal rate and regular rhythm. Exam reveals no friction rub.  Pulmonary/Chest: Effort normal and breath sounds normal. No respiratory distress. He has no wheezes.  Abdominal: Soft. He exhibits no distension.  Musculoskeletal: He exhibits no tenderness.  Neurological: He is alert and oriented to person, place, and time. He exhibits normal muscle tone. Coordination normal.  Skin: Skin is warm and dry. Rash noted. He is not diaphoretic. No erythema. No pallor.  Psychiatric: He has a normal mood and affect.  His behavior is normal. Judgment and thought content normal.  Nursing note and vitals reviewed.   Skin: Still with pedunculated lesions on his fingers several visits ago    07/15/14:      visit 11/15/16:    Hands unchanged today    Assessment/Plan:   #1 HIV: continue BIKTARVY prescription. Renew HMAP in January see me in February  #2Disseminated hsitoplasmosis: hopefully cured but risk for relapse if off meds for sufficient amount of time  #3 hypothyroidism check TSH continue Synthroid see him back with ID pharmacy in about a month and then see me in January or February.  Like to have him seen by primary care  I spent greater than 25 minutes with the patient including greater than 50% of time in face to face counsel of the patient using electronic MacBook based translation services regarding his antiretroviral regimen how he had a lapse in his medications what he was taking for his hypothyroidism and how he was obtaining it and in coordination of his care with pharmacy

## 2017-11-27 LAB — HIV RNA, RTPCR W/R GT (RTI, PI,INT)
HIV 1 RNA Quant: <20 copies/mL
HIV-1 RNA QUANT, LOG: DETECTED {Log_copies}/mL

## 2017-11-27 LAB — TSH: TSH: 3.75 mIU/L (ref 0.40–4.50)

## 2017-11-29 ENCOUNTER — Other Ambulatory Visit: Payer: Self-pay | Admitting: Infectious Disease

## 2017-11-29 DIAGNOSIS — B20 Human immunodeficiency virus [HIV] disease: Secondary | ICD-10-CM

## 2017-12-17 ENCOUNTER — Ambulatory Visit: Payer: Self-pay

## 2018-01-09 ENCOUNTER — Ambulatory Visit: Payer: Self-pay | Admitting: Infectious Disease

## 2018-04-23 ENCOUNTER — Other Ambulatory Visit: Payer: Self-pay

## 2018-04-23 DIAGNOSIS — Z79899 Other long term (current) drug therapy: Secondary | ICD-10-CM

## 2018-04-23 DIAGNOSIS — B20 Human immunodeficiency virus [HIV] disease: Secondary | ICD-10-CM

## 2018-04-24 ENCOUNTER — Telehealth: Payer: Self-pay | Admitting: Infectious Disease

## 2018-04-24 NOTE — Telephone Encounter (Signed)
COVID-19 Pre-Screening Questions: 04/24/18  Do you currently have a fever (>100 F), chills or unexplained body aches? NO  Are you currently experiencing new cough, shortness of breath, sore throat, runny nose? NO    Have you recently travelled outside the state of [place] in the last 14 days? NO    Have you been in contact with someone that is currently pending confirmation of Covid19 testing or has been confirmed to have the Covid19 virus?  NO  **If the patient answers NO to ALL questions -  advise the patient to please call the clinic before coming to the office should any symptoms develop.

## 2018-04-25 ENCOUNTER — Ambulatory Visit: Payer: Self-pay

## 2018-04-25 ENCOUNTER — Other Ambulatory Visit: Payer: Self-pay

## 2018-04-25 ENCOUNTER — Encounter: Payer: Self-pay | Admitting: Infectious Disease

## 2018-04-25 DIAGNOSIS — B20 Human immunodeficiency virus [HIV] disease: Secondary | ICD-10-CM

## 2018-04-25 DIAGNOSIS — Z79899 Other long term (current) drug therapy: Secondary | ICD-10-CM

## 2018-04-26 LAB — T-HELPER CELL (CD4) - (RCID CLINIC ONLY)
CD4 % Helper T Cell: 22 % — ABNORMAL LOW (ref 33–55)
CD4 T Cell Abs: 380 /uL — ABNORMAL LOW (ref 400–2700)

## 2018-04-30 LAB — COMPLETE METABOLIC PANEL WITH GFR
AG Ratio: 1.4 (calc) (ref 1.0–2.5)
ALT: 271 U/L — ABNORMAL HIGH (ref 9–46)
AST: 165 U/L — ABNORMAL HIGH (ref 10–40)
Albumin: 4.6 g/dL (ref 3.6–5.1)
Alkaline phosphatase (APISO): 119 U/L (ref 36–130)
BUN: 8 mg/dL (ref 7–25)
CO2: 26 mmol/L (ref 20–32)
Calcium: 9.7 mg/dL (ref 8.6–10.3)
Chloride: 101 mmol/L (ref 98–110)
Creat: 0.91 mg/dL (ref 0.60–1.35)
GFR, Est African American: 123 mL/min/{1.73_m2} (ref >=60)
GFR, Est Non African American: 106 mL/min/{1.73_m2} (ref >=60)
Globulin: 3.2 g/dL (calc) (ref 1.9–3.7)
Glucose, Bld: 257 mg/dL — ABNORMAL HIGH (ref 65–99)
Potassium: 4.1 mmol/L (ref 3.5–5.3)
Sodium: 137 mmol/L (ref 135–146)
Total Bilirubin: 1.4 mg/dL — ABNORMAL HIGH (ref 0.2–1.2)
Total Protein: 7.8 g/dL (ref 6.1–8.1)

## 2018-04-30 LAB — HIV-1 RNA QUANT-NO REFLEX-BLD
HIV 1 RNA Quant: <20 copies/mL
HIV-1 RNA Quant, Log: <1.3 Log copies/mL

## 2018-04-30 LAB — LIPID PANEL
Cholesterol: 174 mg/dL (ref <200)
HDL: 30 mg/dL — ABNORMAL LOW (ref >=40)
LDL Cholesterol (Calc): 103 mg/dL (calc) — ABNORMAL HIGH
Non-HDL Cholesterol (Calc): 144 mg/dL (calc) — ABNORMAL HIGH (ref <130)
Total CHOL/HDL Ratio: 5.8 (calc) — ABNORMAL HIGH (ref <5.0)
Triglycerides: 307 mg/dL — ABNORMAL HIGH (ref <150)

## 2018-04-30 LAB — CBC WITH DIFFERENTIAL/PLATELET
Absolute Monocytes: 328 cells/uL (ref 200–950)
Basophils Absolute: 31 cells/uL (ref 0–200)
Basophils Relative: 0.6 %
Eosinophils Absolute: 88 cells/uL (ref 15–500)
Eosinophils Relative: 1.7 %
HCT: 46.7 % (ref 38.5–50.0)
Hemoglobin: 16.3 g/dL (ref 13.2–17.1)
Lymphs Abs: 1747 cells/uL (ref 850–3900)
MCH: 31.4 pg (ref 27.0–33.0)
MCHC: 34.9 g/dL (ref 32.0–36.0)
MCV: 90 fL (ref 80.0–100.0)
MPV: 10.1 fL (ref 7.5–12.5)
Monocytes Relative: 6.3 %
Neutro Abs: 3006 cells/uL (ref 1500–7800)
Neutrophils Relative %: 57.8 %
Platelets: 216 10*3/uL (ref 140–400)
RBC: 5.19 10*6/uL (ref 4.20–5.80)
RDW: 14.1 % (ref 11.0–15.0)
Total Lymphocyte: 33.6 %
WBC: 5.2 10*3/uL (ref 3.8–10.8)

## 2018-05-13 ENCOUNTER — Encounter: Payer: Self-pay | Admitting: Infectious Disease

## 2018-05-22 ENCOUNTER — Encounter: Payer: Self-pay | Admitting: Infectious Disease

## 2018-05-22 ENCOUNTER — Other Ambulatory Visit: Payer: Self-pay

## 2018-05-22 ENCOUNTER — Ambulatory Visit (INDEPENDENT_AMBULATORY_CARE_PROVIDER_SITE_OTHER): Payer: Self-pay | Admitting: Infectious Disease

## 2018-05-22 DIAGNOSIS — B20 Human immunodeficiency virus [HIV] disease: Secondary | ICD-10-CM

## 2018-05-22 DIAGNOSIS — B399 Histoplasmosis, unspecified: Secondary | ICD-10-CM

## 2018-05-22 DIAGNOSIS — E039 Hypothyroidism, unspecified: Secondary | ICD-10-CM

## 2018-05-22 MED ORDER — BICTEGRAVIR-EMTRICITAB-TENOFOV 50-200-25 MG PO TABS: 1.0000 | ORAL_TABLET | Freq: Every day | ORAL | 11 refills | Status: DC

## 2018-05-22 MED ORDER — LEVOTHYROXINE SODIUM 200 MCG PO TABS: 200.0000 ug | ORAL_TABLET | Freq: Every day | ORAL | 11 refills | Status: DC

## 2018-05-22 NOTE — Progress Notes (Signed)
Virtual Visit via Telephone Note  I connected with David Knapp on 05/22/18 at 11:30 AM EDT by telephone and verified that I am speaking with the correct person using two identifiers using a telephonic Spanish translator  Location: Patient: Home Provider: [facility] ID clinic   I discussed the limitations, risks, security and privacy concerns of performing an evaluation and management service by telephone and the availability of in person appointments. I also discussed with the patient that there may be a patient responsible charge related to this service. The patient expressed understanding and agreed to proceed.   History of Present Illness:  This is a 38 year old Hispanic man who is living with HIV infection that is well controlled also with comorbid hypothyroidism, history of disseminated histoplasmosis.  He is doing well having renewed his HIV medication assistance program.  His viral load remains undetectable.  He is adherent to the [place] and to his Synthroid.  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    Observations/Objective:  HIV disease well-controlled  Hypothyroidism: On Synthroid    Assessment and Plan:  HIV disease continue Biktarvy he is renewed his HMA P we will have him come back in August for in person visit with labs same day and renewal of HMA P  Hypothyroidism he is getting his Synthroid we will check TSH when he comes to clinic   Follow Up Instructions:    I discussed the assessment and treatment plan with the patient. The patient was provided an opportunity to ask questions and all were answered. The patient agreed with the plan and demonstrated an understanding of the instructions.   The patient was advised to call back or seek an in-person evaluation if the symptoms worsen or if the condition fails to improve as anticipated.  I provided 12  minutes of non-face-to-face time during this encounter.   Acey Lav, MD

## 2018-08-20 ENCOUNTER — Ambulatory Visit: Payer: Self-pay | Admitting: Infectious Disease

## 2018-10-21 ENCOUNTER — Telehealth: Payer: Self-pay

## 2018-10-21 NOTE — Telephone Encounter (Signed)
COVID-19 Pre-Screening Questions:  Do you currently have a fever (>100 F), chills or unexplained body aches? NO   Are you currently experiencing new cough, shortness of breath, sore throat, runny nose?NO .  Have you recently travelled outside the state of [place] in the last 14 days? NO .  Have you been in contact with someone that is currently pending confirmation of Covid19 testing or has been confirmed to have the Covid19 virus?  NO  **If the patient answers NO to ALL questions -  advise the patient to please call the clinic before coming to the office should any symptoms develop.     

## 2018-10-22 ENCOUNTER — Telehealth: Payer: Self-pay | Admitting: Pharmacy Technician

## 2018-10-22 ENCOUNTER — Ambulatory Visit: Payer: Self-pay

## 2018-10-22 ENCOUNTER — Other Ambulatory Visit: Payer: Self-pay

## 2018-10-22 ENCOUNTER — Encounter: Payer: Self-pay | Admitting: Infectious Disease

## 2018-10-22 ENCOUNTER — Ambulatory Visit (INDEPENDENT_AMBULATORY_CARE_PROVIDER_SITE_OTHER): Payer: Self-pay | Admitting: Infectious Disease

## 2018-10-22 VITALS — BP 119/78 | HR 76 | Temp 98.2°F | Wt 246.8 lb

## 2018-10-22 DIAGNOSIS — Z23 Encounter for immunization: Secondary | ICD-10-CM

## 2018-10-22 DIAGNOSIS — E039 Hypothyroidism, unspecified: Secondary | ICD-10-CM

## 2018-10-22 DIAGNOSIS — B20 Human immunodeficiency virus [HIV] disease: Secondary | ICD-10-CM

## 2018-10-22 MED ORDER — LEVOTHYROXINE SODIUM 200 MCG PO TABS: 200.0000 ug | ORAL_TABLET | Freq: Every day | ORAL | 11 refills | Status: DC

## 2018-10-22 MED ORDER — BIKTARVY 50-200-25 MG PO TABS: 1.0000 | ORAL_TABLET | Freq: Every day | ORAL | 11 refills | Status: DC

## 2018-10-22 NOTE — Addendum Note (Signed)
Addended by: Rockne Coons R on: 10/22/2018 11:08 AM   Modules accepted: Orders

## 2018-10-22 NOTE — Progress Notes (Signed)
HPI   Chief complaint: followup for HIV  38 year old [place] gentleman with [place] significant for HIV/AIDS and disseminated histoplasmosis then  with recurrent disseminated histoplasmosis with CNS involvement sp 2 weeks ampho (see prior notes) and sp itraconazole  for nearly 2 years.   He had been previously treated with PREZCOBIX and DESCOVY with nice virological suppression recently changed to [facility] with continued suppression.     David Knapp has been relatively well controlled recently.  He does have comorbid hypothyroidism for which she takes Synthroid page $32 at the [facility].  He came to clinic today and and is getting his HMA P renewed.  He tells me that he has roughly 1 week of medications left.  He is renewing program today.  I also procured a 28-day supply of Biktarvy for him.  Additionally pharmacy was able to procure a coupon for a free 30-day supply from [place] there advancing access so should have 2 months of medications which would give margin for his program to kick in.  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    Past Medical History:  Diagnosis Date  . Allergic reaction 03/22/2016  . Change in voice 03/22/2016  . CHF (congestive heart failure) ([place]) 04/23/2017  . CKD (chronic kidney disease), stage II   . 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 Possible recurrence 2014 (currently being evaluated 05/2012)  . Edema 04/27/2015  . HIV (human immunodeficiency virus infection) ([place])   . Hypothyroidism 04/24/2017  . Leg swelling 04/2017  . Pancytopenia ([place])   . 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 Throat swelling 03/22/2016    Past Surgical History:  Procedure Laterality Date  . NO PAST SURGERIES      Family History  Problem Relation Age of Onset  . Coronary artery disease Mother        died from MI @ age 3 yo      Social  History   Socioeconomic History  . Marital status: Single    Spouse name: Not on file  . Number of children: 0  . Years of education: 6th grade  . Highest education level: Not on file  Occupational History  . Occupation: Teacher, music: Environmental consultant Needs  . Financial resource strain: Not on file  . Food insecurity    Worry: Not on file    Inability: Not on file  . Transportation needs    Medical: Not on file    Non-medical: Not on file  Tobacco Use  . Smoking status: Never Smoker  . Smokeless tobacco: Never Used  Substance and Sexual Activity  . Alcohol use: No    Alcohol/week: 6.0 standard drinks    Types: 6 Standard drinks or equivalent per week  . Drug use: No  . Sexual activity: Not Currently    Partners: Female    Comment: given condoms 10/2018  Lifestyle  . Physical activity    Days per week: Not on file    Minutes per session: Not on file  . Stress: Not on file  Relationships  . Social Herbalist on phone: Not on file    Gets together: Not on file    Attends religious service: Not on file    Active member of club or organization: Not on file    Attends  meetings of clubs or organizations: Not on file    Relationship status: Not on file  Other Topics Concern  . Not on file  Social History Narrative   Lives in [place] with his wife and 3 nephews/nieces. Is spanish speaking only - requiring interpretor. He is able to read and write in Spanish only    Allergies  Allergen Reactions  . Sulfa Antibiotics Other (See Comments)    He believes he has experienced throat swelling with prezcobix and Descovy for nearly a year since April 2017 to present March 22, 2016     Current Outpatient Medications:  .  bictegravir-emtricitabine-tenofovir AF (BIKTARVY) 50-200-25 MG TABS tablet, Take 1 tablet by mouth daily., Disp: 30 tablet, Rfl: 11 .  levothyroxine (SYNTHROID) 200 MCG tablet, Take 1 tablet (200 mcg total) by mouth daily before  breakfast., Disp: 30 tablet, Rfl: 11   Review of Systems  Constitutional: Negative for appetite change, chills, diaphoresis and fever.  HENT: Negative for congestion, rhinorrhea, sinus pressure, sneezing, sore throat, trouble swallowing and voice change.   Eyes: Negative for photophobia and visual disturbance.  Respiratory: Negative for chest tightness, shortness of breath, wheezing and stridor.   Cardiovascular: Negative for chest pain and palpitations.  Gastrointestinal: Negative for abdominal distention, abdominal pain, anal bleeding, blood in stool, constipation, diarrhea, nausea and vomiting.  Genitourinary: Negative for difficulty urinating, dysuria, flank pain and hematuria.  Musculoskeletal: Negative for arthralgias, back pain, gait problem, joint swelling and myalgias.  Skin: Negative for pallor and wound.  Neurological: Negative for dizziness, tremors, weakness and light-headedness.  Hematological: Negative for adenopathy. Does not bruise/bleed easily.  Psychiatric/Behavioral: Negative for agitation, behavioral problems, confusion, decreased concentration, dysphoric mood and sleep disturbance.     Physical Exam  Constitutional: He is oriented to person, place, and time. He appears well-nourished. No distress.  HENT:  Head: Normocephalic and atraumatic.  Mouth/Throat: Oropharynx is clear and moist. No oropharyngeal exudate.  Eyes: Conjunctivae and EOM are normal. No scleral icterus.  Neck: Normal range of motion. Neck supple.  Cardiovascular: Normal rate and regular rhythm. Exam reveals no friction rub.  Pulmonary/Chest: Effort normal and breath sounds normal. No respiratory distress. He has no wheezes.  Abdominal: Soft. He exhibits no distension.  Musculoskeletal:        General: No tenderness.  Neurological: He is alert and oriented to person, place, and time. He exhibits normal muscle tone. Coordination normal.  Skin: Skin is warm and dry. He is not diaphoretic. No  erythema. No pallor.  Psychiatric: He has a normal mood and affect. His behavior is normal. Judgment and thought content normal.  Nursing note and vitals reviewed.   Skin: Still with pedunculated lesions on his fingers several visits ago      Assessment/Plan:   #1 HIV: continue BIKTARVY prescription. Renew HMAP today, meds dispensed as above. Checking labs today  Give flu shot and Pneumovax         #2Disseminated hsitoplasmosis: hopefully cured but risk for relapse if off meds for sufficient amount of time  #3 hypothyroidism check TSH continue Synthroid   Visit conducted with Spanish translation in person

## 2018-10-22 NOTE — Telephone Encounter (Addendum)
[facility] Patient Advocate Encounter  Completed and sent [employer] Advancing Access application for [place] for this patient who is uninsured.    Patient is approved 10/22/2018 through 10/22/2019.  BIN      [id number] PCN    [id number] GRP    [id number] ID        [id number]   Inez Catalina E. Nadara Mustard [place] Patient [facility] for Infectious Disease Phone: [phone] Fax:  [phone]

## 2018-10-23 LAB — T-HELPER CELL (CD4) - (RCID CLINIC ONLY)
CD4 % Helper T Cell: 23 % — ABNORMAL LOW (ref 33–65)
CD4 T Cell Abs: 365 /uL — ABNORMAL LOW (ref 400–1790)

## 2018-10-24 ENCOUNTER — Telehealth: Payer: Self-pay

## 2018-10-24 LAB — URINE CYTOLOGY ANCILLARY ONLY
Chlamydia: NEGATIVE
Comment: NEGATIVE
Comment: NORMAL
Neisseria Gonorrhea: NEGATIVE

## 2018-10-24 NOTE — Telephone Encounter (Signed)
[place] interpreters: Verdis Frederickson ID: [id number] Left patient a voicemail regarding synthroid being sent to [facility] on [address]. Unable to verify with patient if he has been taking synthroid.  Per pharmacy he was about 2 week late picking up prescription in July and Aug. Pharmacy verified that patient just picked up medication on 10/18/18. Eugenia Mcalpine

## 2018-10-24 NOTE — Telephone Encounter (Signed)
-----   Message from Truman Hayward, MD sent at 10/23/2018  4:44 PM EDT ----- I suspect that Lakyn does not have his Synthroid can we investigate

## 2018-10-25 LAB — COMPLETE METABOLIC PANEL WITH GFR
AG Ratio: 1.5 (calc) (ref 1.0–2.5)
ALT: 201 U/L — ABNORMAL HIGH (ref 9–46)
AST: 144 U/L — ABNORMAL HIGH (ref 10–40)
Albumin: 4.3 g/dL (ref 3.6–5.1)
Alkaline phosphatase (APISO): 93 U/L (ref 36–130)
BUN: 13 mg/dL (ref 7–25)
CO2: 24 mmol/L (ref 20–32)
Calcium: 9.2 mg/dL (ref 8.6–10.3)
Chloride: 105 mmol/L (ref 98–110)
Creat: 0.96 mg/dL (ref 0.60–1.35)
GFR, Est African American: 115 mL/min/{1.73_m2} (ref >=60)
GFR, Est Non African American: 99 mL/min/{1.73_m2} (ref >=60)
Globulin: 2.9 g/dL (calc) (ref 1.9–3.7)
Glucose, Bld: 219 mg/dL — ABNORMAL HIGH (ref 65–99)
Potassium: 4 mmol/L (ref 3.5–5.3)
Sodium: 137 mmol/L (ref 135–146)
Total Bilirubin: 0.8 mg/dL (ref 0.2–1.2)
Total Protein: 7.2 g/dL (ref 6.1–8.1)

## 2018-10-25 LAB — HIV-1 RNA QUANT-NO REFLEX-BLD
HIV 1 RNA Quant: <20 copies/mL — AB
HIV-1 RNA Quant, Log: <1.3 Log copies/mL — AB

## 2018-10-25 LAB — CBC WITH DIFFERENTIAL/PLATELET
Absolute Monocytes: 399 cells/uL (ref 200–950)
Basophils Absolute: 40 cells/uL (ref 0–200)
Basophils Relative: 0.7 %
Eosinophils Absolute: 211 cells/uL (ref 15–500)
Eosinophils Relative: 3.7 %
HCT: 45.9 % (ref 38.5–50.0)
Hemoglobin: 16 g/dL (ref 13.2–17.1)
Lymphs Abs: 1545 cells/uL (ref 850–3900)
MCH: 32 pg (ref 27.0–33.0)
MCHC: 34.9 g/dL (ref 32.0–36.0)
MCV: 91.8 fL (ref 80.0–100.0)
MPV: 10 fL (ref 7.5–12.5)
Monocytes Relative: 7 %
Neutro Abs: 3506 cells/uL (ref 1500–7800)
Neutrophils Relative %: 61.5 %
Platelets: 224 10*3/uL (ref 140–400)
RBC: 5 10*6/uL (ref 4.20–5.80)
RDW: 13.1 % (ref 11.0–15.0)
Total Lymphocyte: 27.1 %
WBC: 5.7 10*3/uL (ref 3.8–10.8)

## 2018-10-25 LAB — LIPID PANEL
Cholesterol: 190 mg/dL (ref <200)
HDL: 34 mg/dL — ABNORMAL LOW (ref >=40)
LDL Cholesterol (Calc): 122 mg/dL (calc) — ABNORMAL HIGH
Non-HDL Cholesterol (Calc): 156 mg/dL (calc) — ABNORMAL HIGH (ref <130)
Total CHOL/HDL Ratio: 5.6 (calc) — ABNORMAL HIGH (ref <5.0)
Triglycerides: 222 mg/dL — ABNORMAL HIGH (ref <150)

## 2018-10-25 LAB — RPR: RPR Ser Ql: NONREACTIVE

## 2018-10-25 LAB — TSH: TSH: 6.88 mIU/L — ABNORMAL HIGH (ref 0.40–4.50)

## 2018-10-30 NOTE — Telephone Encounter (Signed)
Thanks so much. 

## 2018-10-31 ENCOUNTER — Telehealth: Payer: Self-pay

## 2018-10-31 NOTE — Telephone Encounter (Signed)
-----   Message from Truman Hayward, MD sent at 10/28/2018  8:52 AM EDT ----- Can we get Audelia Hives to touch base w him and ask him to fill this med. It may not be covered by HMAP but maybe we could also see if ID pharmacy could help w cost of medicine maybe? ----- Message ----- From: Eugenia Mcalpine, LPN Sent: 19/16/6060   4:25 PM EDT To: Truman Hayward, MD, #  Per pharmacy patient was last picking up Rx for 2 weeks  in July and Aug.

## 2018-10-31 NOTE — Telephone Encounter (Signed)
David Knapp contacted patient today and verified that he is indeed taking his synthroid. Eugenia Mcalpine

## 2018-11-01 NOTE — Telephone Encounter (Signed)
Hopefully repeat TSH will look Better

## 2019-01-14 ENCOUNTER — Ambulatory Visit: Payer: Self-pay | Admitting: Infectious Disease

## 2019-03-24 ENCOUNTER — Encounter: Payer: Self-pay | Admitting: Infectious Disease

## 2019-04-01 ENCOUNTER — Telehealth: Payer: Self-pay

## 2019-04-01 NOTE — Telephone Encounter (Signed)
COVID-19 Pre-Screening Questions:04/01/19  Do you currently have a fever (>100 F), chills or unexplained body aches? NO   Are you currently experiencing new cough, shortness of breath, sore throat, runny nose? NO .  Have you recently travelled outside the state of [place] in the last 14 days? NO .  Have you been in contact with someone that is currently pending confirmation of Covid19 testing or has been confirmed to have the Covid19 virus?  NO  **If the patient answers NO to ALL questions -  advise the patient to please call the clinic before coming to the office should any symptoms develop.     

## 2019-04-02 ENCOUNTER — Encounter: Payer: Self-pay | Admitting: Infectious Disease

## 2019-04-02 ENCOUNTER — Other Ambulatory Visit: Payer: Self-pay

## 2019-04-02 ENCOUNTER — Ambulatory Visit (INDEPENDENT_AMBULATORY_CARE_PROVIDER_SITE_OTHER): Payer: Self-pay | Admitting: Infectious Disease

## 2019-04-02 VITALS — BP 115/63 | HR 83 | Temp 98.3°F | Wt 233.0 lb

## 2019-04-02 DIAGNOSIS — B20 Human immunodeficiency virus [HIV] disease: Secondary | ICD-10-CM

## 2019-04-02 DIAGNOSIS — B399 Histoplasmosis, unspecified: Secondary | ICD-10-CM

## 2019-04-02 DIAGNOSIS — E039 Hypothyroidism, unspecified: Secondary | ICD-10-CM

## 2019-04-02 MED ORDER — LEVOTHYROXINE SODIUM 200 MCG PO TABS: 200.0000 ug | ORAL_TABLET | Freq: Every day | ORAL | 11 refills | Status: DC

## 2019-04-02 MED ORDER — BIKTARVY 50-200-25 MG PO TABS: 1.0000 | ORAL_TABLET | Freq: Every day | ORAL | 11 refills | Status: DC

## 2019-04-02 NOTE — Progress Notes (Signed)
HPI   Chief complaint: followup for HIV and hypothyroidism  39year old David Knapp with [facility]HX significant for HIV/AIDS and disseminated histoplasmosis then  with recurrent disseminated histoplasmosis with CNS involvement sp 2 weeks ampho (see prior notes) and sp itraconazole  for nearly 2 years.   He had been previously treated with PREZCOBIX and DESCOVY with nice virological suppression recently changed to [facility] with continued suppression.     David Knapp has been relatively well controlled recently.  He does have comorbid hypothyroidism for which she takes Synthroid which costs hime $15 at the [facility].  Asthma he takes both the Synthroid and Biktarvy at the same time and takes it with a meal.  We need to check his TSH has not been checked for several months now.   Past Medical History:  Diagnosis Date  . Allergic reaction 03/22/2016  . Change in voice 03/22/2016  . CHF (congestive heart failure) ([facility]) 04/23/2017  . CKD (chronic kidney disease), stage II   . 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 Possible recurrence 2014 (currently being evaluated 05/2012)  . Edema 04/27/2015  . HIV (human immunodeficiency virus infection) ([facility])   . Hypothyroidism 04/24/2017  . Leg swelling 04/2017  . Pancytopenia ([facility])   . 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 Throat swelling 03/22/2016    Past Surgical History:  Procedure Laterality Date  . NO PAST SURGERIES      Family History  Problem Relation Age of Onset  . Coronary artery disease Mother        died from MI @ age 2 yo      Social History   Socioeconomic History  . Marital status: Single    Spouse name: Not on file  . Number of children: 0  . Years of education: 6th grade  . Highest education level: Not on file  Occupational History  . Occupation: Freight forwarder: landscaping  Tobacco Use  . Smoking status: Never Smoker  . Smokeless tobacco: Never Used    Substance and Sexual Activity  . Alcohol use: No    Alcohol/week: 6.0 standard drinks    Types: 6 Standard drinks or equivalent per week  . Drug use: No  . Sexual activity: Not Currently    Partners: Female    Comment: given condoms 10/2018  Other Topics Concern  . Not on file  Social History Narrative   Lives in [place] with his wife and 3 nephews/nieces. Is spanish speaking only - requiring interpretor. He is able to read and write in Spanish only   Social Determinants of Health   Financial Resource Strain:   . Difficulty of Paying Living Expenses:   Food Insecurity:   . Worried About Programme researcher, broadcasting/film/video in the Last Year:   . Barista in the Last Year:   Transportation Needs:   . Freight forwarder (Medical):   Marland Kitchen Lack of Transportation (Non-Medical):   Physical Activity:   . Days of Exercise per Week:   . Minutes of Exercise per Session:   Stress:   . Feeling of Stress :   Social Connections:   . Frequency of Communication with Friends and Family:   . Frequency of Social Gatherings with Friends and Family:   . Attends Religious Services:   . Active Member of Clubs or Organizations:   . Attends Banker Meetings:   Marland Kitchen Marital Status:     Allergies  Allergen Reactions  .  Sulfa Antibiotics Other (See Comments)    He believes he has experienced throat swelling with prezcobix and Descovy for nearly a year since April 2017 to present March 22, 2016     Current Outpatient Medications:  .  bictegravir-emtricitabine-tenofovir AF (BIKTARVY) 50-200-25 MG TABS tablet, Take 1 tablet by mouth daily., Disp: 30 tablet, Rfl: 11 .  levothyroxine (SYNTHROID) 200 MCG tablet, Take 1 tablet (200 mcg total) by mouth daily before breakfast., Disp: 30 tablet, Rfl: 11   Review of Systems  Constitutional: Negative for appetite change, chills, diaphoresis and fever.  HENT: Negative for congestion, rhinorrhea, sinus pressure, sneezing, sore throat, trouble  swallowing and voice change.   Eyes: Negative for photophobia and visual disturbance.  Respiratory: Negative for chest tightness, shortness of breath, wheezing and stridor.   Cardiovascular: Negative for chest pain and palpitations.  Gastrointestinal: Negative for abdominal distention, abdominal pain, anal bleeding, blood in stool, constipation, diarrhea, nausea and vomiting.  Genitourinary: Negative for difficulty urinating, dysuria, flank pain and hematuria.  Musculoskeletal: Negative for arthralgias, back pain, gait problem, joint swelling and myalgias.  Skin: Negative for pallor and wound.  Allergic/Immunologic: Negative for environmental allergies.  Neurological: Negative for dizziness, tremors, weakness and light-headedness.  Hematological: Negative for adenopathy. Does not bruise/bleed easily.  Psychiatric/Behavioral: Negative for agitation, behavioral problems, confusion, decreased concentration, dysphoric mood and sleep disturbance.     Physical Exam  Constitutional: He is oriented to person, place, and time. He appears well-nourished. No distress.  HENT:  Head: Normocephalic and atraumatic.  Mouth/Throat: Oropharynx is clear and moist. No oropharyngeal exudate.  Eyes: Conjunctivae and EOM are normal. No scleral icterus.  Cardiovascular: Normal rate and regular rhythm. Exam reveals no friction rub.  Pulmonary/Chest: Effort normal and breath sounds normal. No respiratory distress. He has no wheezes.  Abdominal: Soft. He exhibits no distension.  Musculoskeletal:        General: No tenderness.     Cervical back: Neck supple.  Neurological: He is alert and oriented to person, place, and time. He exhibits normal muscle tone. Coordination normal.  Skin: Skin is warm and dry. He is not diaphoretic. No erythema. No pallor.  Psychiatric: He has a normal mood and affect. His behavior is normal. Judgment and thought content normal.  Nursing note and vitals reviewed.   Skin: Still with  pedunculated lesions on his fingers several visits ago      Assessment/Plan:   #1 HIV: continue BIKTARVY prescription. Renew HMAP today, meds dispensed as above. Checking labs today    #2 hypothyroidism: Check TSH he has been taking it on a full stomach.  If TSH is not sufficiently in range consider having him take his current medication on an empty stomach versus increasing dose.    #3 Disseminated hsitoplasmosis: hopefully cured but risk for relapse if off meds for sufficient amount of time  Visit conducted with Spanish translation in person

## 2019-04-03 LAB — T-HELPER CELL (CD4) - (RCID CLINIC ONLY)
CD4 % Helper T Cell: 24 % — ABNORMAL LOW (ref 33–65)
CD4 T Cell Abs: 365 /uL — ABNORMAL LOW (ref 400–1790)

## 2019-04-04 LAB — HIV-1 RNA QUANT-NO REFLEX-BLD
HIV 1 RNA Quant: <20 copies/mL — AB
HIV-1 RNA Quant, Log: <1.3 Log copies/mL — AB

## 2019-04-04 LAB — CBC WITH DIFFERENTIAL/PLATELET
Absolute Monocytes: 264 cells/uL (ref 200–950)
Basophils Absolute: 29 cells/uL (ref 0–200)
Basophils Relative: 0.6 %
Eosinophils Absolute: 154 cells/uL (ref 15–500)
Eosinophils Relative: 3.2 %
HCT: 45.7 % (ref 38.5–50.0)
Hemoglobin: 15.6 g/dL (ref 13.2–17.1)
Lymphs Abs: 1507 cells/uL (ref 850–3900)
MCH: 31.3 pg (ref 27.0–33.0)
MCHC: 34.1 g/dL (ref 32.0–36.0)
MCV: 91.8 fL (ref 80.0–100.0)
MPV: 9.5 fL (ref 7.5–12.5)
Monocytes Relative: 5.5 %
Neutro Abs: 2846 cells/uL (ref 1500–7800)
Neutrophils Relative %: 59.3 %
Platelets: 220 10*3/uL (ref 140–400)
RBC: 4.98 10*6/uL (ref 4.20–5.80)
RDW: 12.9 % (ref 11.0–15.0)
Total Lymphocyte: 31.4 %
WBC: 4.8 10*3/uL (ref 3.8–10.8)

## 2019-04-04 LAB — COMPLETE METABOLIC PANEL WITH GFR
AG Ratio: 1.5 (calc) (ref 1.0–2.5)
ALT: 48 U/L — ABNORMAL HIGH (ref 9–46)
AST: 39 U/L (ref 10–40)
Albumin: 4.1 g/dL (ref 3.6–5.1)
Alkaline phosphatase (APISO): 77 U/L (ref 36–130)
BUN: 12 mg/dL (ref 7–25)
CO2: 23 mmol/L (ref 20–32)
Calcium: 9.1 mg/dL (ref 8.6–10.3)
Chloride: 104 mmol/L (ref 98–110)
Creat: 0.87 mg/dL (ref 0.60–1.35)
GFR, Est African American: 125 mL/min/{1.73_m2} (ref >=60)
GFR, Est Non African American: 108 mL/min/{1.73_m2} (ref >=60)
Globulin: 2.7 g/dL (calc) (ref 1.9–3.7)
Glucose, Bld: 275 mg/dL — ABNORMAL HIGH (ref 65–99)
Potassium: 3.9 mmol/L (ref 3.5–5.3)
Sodium: 135 mmol/L (ref 135–146)
Total Bilirubin: 1.2 mg/dL (ref 0.2–1.2)
Total Protein: 6.8 g/dL (ref 6.1–8.1)

## 2019-04-04 LAB — RPR: RPR Ser Ql: NONREACTIVE

## 2019-04-04 LAB — TSH: TSH: 1.21 mIU/L (ref 0.40–4.50)

## 2019-04-04 LAB — LIPID PANEL
Cholesterol: 153 mg/dL (ref <200)
HDL: 29 mg/dL — ABNORMAL LOW (ref >=40)
LDL Cholesterol (Calc): 89 mg/dL (calc)
Non-HDL Cholesterol (Calc): 124 mg/dL (calc) (ref <130)
Total CHOL/HDL Ratio: 5.3 (calc) — ABNORMAL HIGH (ref <5.0)
Triglycerides: 263 mg/dL — ABNORMAL HIGH (ref <150)

## 2019-06-03 ENCOUNTER — Ambulatory Visit (INDEPENDENT_AMBULATORY_CARE_PROVIDER_SITE_OTHER): Payer: Self-pay | Admitting: Infectious Disease

## 2019-06-03 ENCOUNTER — Other Ambulatory Visit: Payer: Self-pay

## 2019-06-03 ENCOUNTER — Encounter: Payer: Self-pay | Admitting: Infectious Disease

## 2019-06-03 VITALS — BP 109/75 | HR 98 | Temp 98.3°F | Ht 65.0 in

## 2019-06-03 DIAGNOSIS — E039 Hypothyroidism, unspecified: Secondary | ICD-10-CM

## 2019-06-03 DIAGNOSIS — N1831 Chronic kidney disease, stage 3a: Secondary | ICD-10-CM

## 2019-06-03 DIAGNOSIS — B20 Human immunodeficiency virus [HIV] disease: Secondary | ICD-10-CM

## 2019-06-03 DIAGNOSIS — B399 Histoplasmosis, unspecified: Secondary | ICD-10-CM

## 2019-06-03 DIAGNOSIS — R739 Hyperglycemia, unspecified: Secondary | ICD-10-CM

## 2019-06-03 HISTORY — DX: Hyperglycemia, unspecified: R73.9

## 2019-06-03 NOTE — Progress Notes (Signed)
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   He had been previously treated with PREZCOBIX and DESCOVY with nice virological suppression recently changed to [facility] with continued suppression.     [name] has been relatively well controlled recently.  He does have comorbid hypothyroidism for which she takes Synthroid which costs hime $15 at the [facility].  He has been  Taking both the Synthroid and Biktarvy at the same time and takes it with a meal.  TSH was in range when I checked it.  Am concerned that he probably has comorbid diabetes given his blood sugars have been over 270 when checked here in the clinic so we will check an A1c today.   Past Medical History:  Diagnosis Date  . Allergic reaction 03/22/2016  . Change in voice 03/22/2016  . CHF (congestive heart failure) (HCC) 04/23/2017  . CKD (chronic kidney disease), stage II   . 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 Possible recurrence 2014 (currently being evaluated 05/2012)  . Edema 04/27/2015  . HIV (human immunodeficiency virus infection) (HCC)   . Hypothyroidism 04/24/2017  . Leg swelling 04/2017  . Pancytopenia (HCC)   . 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 Throat swelling 03/22/2016    Past Surgical History:  Procedure Laterality Date  . NO PAST SURGERIES      Family History  Problem Relation Age of Onset  . Coronary artery disease Mother        died from MI @ age 37 yo      Social History   Socioeconomic History  . Marital status: Single    Spouse name: Not on file  . Number of children: 0  . Years of education: 6th grade  . Highest education level: Not on file  Occupational History   . Occupation: Freight forwarder: landscaping  Tobacco Use  . Smoking status: Never Smoker  . Smokeless tobacco: Never Used  Substance and Sexual Activity  . Alcohol use: No    Alcohol/week: 6.0 standard drinks    Types: 6 Standard drinks or equivalent per week  . Drug use: No  . Sexual activity: Not Currently    Partners: Female    Comment: given condoms 10/2018  Other Topics Concern  . Not on file  Social History Narrative   Lives in [place] with his wife and 3 nephews/nieces. Is spanish speaking only - requiring interpretor. He is able to read and write in Spanish only   Social Determinants of Health   Financial Resource Strain:   . Difficulty of Paying Living Expenses:   Food Insecurity:   . Worried About Programme researcher, broadcasting/film/video in the Last Year:   . Barista in the Last Year:   Transportation Needs:   . Freight forwarder (Medical):   Marland Kitchen Lack of Transportation (Non-Medical):   Physical Activity:   . Days of Exercise per Week:   . Minutes of Exercise per Session:   Stress:   . Feeling of Stress :   Social Connections:   . Frequency of Communication with Friends and Family:   . Frequency of Social Gatherings with Friends and Family:   . Attends Religious Services:   .  Active Member of Clubs or Organizations:   . Attends Archivist Meetings:   Marland Kitchen Marital Status:     Allergies  Allergen Reactions  . Sulfa Antibiotics Other (See Comments)    He believes he has experienced throat swelling with prezcobix and Descovy for nearly a year since April 2017 to present March 22, 2016     Current Outpatient Medications:  .  bictegravir-emtricitabine-tenofovir AF (BIKTARVY) 50-200-25 MG TABS tablet, Take 1 tablet by mouth daily., Disp: 30 tablet, Rfl: 11 .  levothyroxine (SYNTHROID) 200 MCG tablet, Take 1 tablet (200 mcg total) by mouth daily before breakfast., Disp: 30 tablet, Rfl: 11   Review of Systems  Constitutional: Negative for appetite  change, chills, diaphoresis and fever.  HENT: Negative for congestion, rhinorrhea, sinus pressure, sneezing, sore throat, trouble swallowing and voice change.   Eyes: Negative for photophobia and visual disturbance.  Respiratory: Negative for chest tightness, shortness of breath, wheezing and stridor.   Cardiovascular: Negative for chest pain and palpitations.  Gastrointestinal: Negative for abdominal distention, abdominal pain, anal bleeding, blood in stool, constipation, diarrhea, nausea and vomiting.  Genitourinary: Negative for difficulty urinating, dysuria, flank pain and hematuria.  Musculoskeletal: Negative for arthralgias, back pain, gait problem, joint swelling and myalgias.  Skin: Negative for pallor and wound.  Allergic/Immunologic: Negative for environmental allergies.  Neurological: Negative for dizziness, tremors, weakness and light-headedness.  Hematological: Negative for adenopathy. Does not bruise/bleed easily.  Psychiatric/Behavioral: Negative for agitation, behavioral problems, confusion, decreased concentration, dysphoric mood, sleep disturbance and suicidal ideas. The patient is not nervous/anxious.      Physical Exam  Constitutional: He is oriented to person, place, and time. He appears well-nourished. No distress.  HENT:  Head: Normocephalic and atraumatic.  Mouth/Throat: Oropharynx is clear and moist. No oropharyngeal exudate.  Eyes: Conjunctivae and EOM are normal. No scleral icterus.  Neck: No thyromegaly present.  Cardiovascular: Normal rate and regular rhythm. Exam reveals no friction rub.  Pulmonary/Chest: Effort normal and breath sounds normal. No respiratory distress. He has no wheezes.  Abdominal: Soft. He exhibits no distension.  Musculoskeletal:        General: No tenderness.     Cervical back: Neck supple.  Neurological: He is alert and oriented to person, place, and time. He exhibits normal muscle tone. Coordination normal.  Skin: Skin is warm and  dry. He is not diaphoretic. No erythema. No pallor.  Psychiatric: He has a normal mood and affect. His behavior is normal. Judgment and thought content normal.  Nursing note and vitals reviewed.   Skin: Still with pedunculated lesions on his fingers several visits ago      Assessment/Plan:   #1 HIV: continue BIKTARVY prescription. Renew HMAnext month and have made appointment for him on July 1 with Don.  Can see me back in August   #2 hypothyroidism: Check TSH   #3 LIkely DM: Checking an A1c and if he does have diabetes will initiate Metformin and a statin and refer him to primary care  #3 Disseminated hsitoplasmosis: hopefully cured but risk for relapse if off meds for sufficient amount of time  #4 COVID-19 prevention he is to go get Covid vaccinated at the [facility] I have given him information how to get that. Visit conducted with Spanish translation in person

## 2019-06-04 ENCOUNTER — Other Ambulatory Visit: Payer: Self-pay | Admitting: Infectious Disease

## 2019-06-04 ENCOUNTER — Encounter: Payer: Self-pay | Admitting: Infectious Disease

## 2019-06-04 DIAGNOSIS — E1165 Type 2 diabetes mellitus with hyperglycemia: Secondary | ICD-10-CM | POA: Insufficient documentation

## 2019-06-04 HISTORY — DX: Type 2 diabetes mellitus with hyperglycemia: E11.65

## 2019-06-04 LAB — URINE CYTOLOGY ANCILLARY ONLY
Chlamydia: NEGATIVE
Comment: NEGATIVE
Comment: NORMAL
Neisseria Gonorrhea: NEGATIVE

## 2019-06-04 LAB — T-HELPER CELL (CD4) - (RCID CLINIC ONLY)
CD4 % Helper T Cell: 24 % — ABNORMAL LOW (ref 33–65)
CD4 T Cell Abs: 436 /uL (ref 400–1790)

## 2019-06-04 MED ORDER — METFORMIN HCL 500 MG PO TABS: 500.0000 mg | ORAL_TABLET | Freq: Two times a day (BID) | ORAL | 1 refills | Status: DC

## 2019-06-05 LAB — COMPLETE METABOLIC PANEL WITH GFR
AG Ratio: 1.4 (calc) (ref 1.0–2.5)
ALT: 102 U/L — ABNORMAL HIGH (ref 9–46)
AST: 65 U/L — ABNORMAL HIGH (ref 10–40)
Albumin: 4.3 g/dL (ref 3.6–5.1)
Alkaline phosphatase (APISO): 93 U/L (ref 36–130)
BUN: 16 mg/dL (ref 7–25)
CO2: 25 mmol/L (ref 20–32)
Calcium: 9.4 mg/dL (ref 8.6–10.3)
Chloride: 107 mmol/L (ref 98–110)
Creat: 0.87 mg/dL (ref 0.60–1.35)
GFR, Est African American: 125 mL/min/{1.73_m2} (ref >=60)
GFR, Est Non African American: 108 mL/min/{1.73_m2} (ref >=60)
Globulin: 3.1 g/dL (calc) (ref 1.9–3.7)
Glucose, Bld: 151 mg/dL — ABNORMAL HIGH (ref 65–99)
Potassium: 4.3 mmol/L (ref 3.5–5.3)
Sodium: 140 mmol/L (ref 135–146)
Total Bilirubin: 0.7 mg/dL (ref 0.2–1.2)
Total Protein: 7.4 g/dL (ref 6.1–8.1)

## 2019-06-05 LAB — TSH: TSH: 1.69 mIU/L (ref 0.40–4.50)

## 2019-06-05 LAB — CBC WITH DIFFERENTIAL/PLATELET
Absolute Monocytes: 398 cells/uL (ref 200–950)
Basophils Absolute: 29 cells/uL (ref 0–200)
Basophils Relative: 0.6 %
Eosinophils Absolute: 110 cells/uL (ref 15–500)
Eosinophils Relative: 2.3 %
HCT: 46.4 % (ref 38.5–50.0)
Hemoglobin: 16 g/dL (ref 13.2–17.1)
Lymphs Abs: 1790 cells/uL (ref 850–3900)
MCH: 30.5 pg (ref 27.0–33.0)
MCHC: 34.5 g/dL (ref 32.0–36.0)
MCV: 88.5 fL (ref 80.0–100.0)
MPV: 9.8 fL (ref 7.5–12.5)
Monocytes Relative: 8.3 %
Neutro Abs: 2472 cells/uL (ref 1500–7800)
Neutrophils Relative %: 51.5 %
Platelets: 258 10*3/uL (ref 140–400)
RBC: 5.24 10*6/uL (ref 4.20–5.80)
RDW: 13.5 % (ref 11.0–15.0)
Total Lymphocyte: 37.3 %
WBC: 4.8 10*3/uL (ref 3.8–10.8)

## 2019-06-05 LAB — HEMOGLOBIN A1C
Hgb A1c MFr Bld: 8.4 % of total Hgb — ABNORMAL HIGH (ref <5.7)
Mean Plasma Glucose: 194 (calc)
eAG (mmol/L): 10.8 (calc)

## 2019-06-05 LAB — HIV-1 RNA QUANT-NO REFLEX-BLD
HIV 1 RNA Quant: <20 copies/mL
HIV-1 RNA Quant, Log: <1.3 Log copies/mL

## 2019-06-05 LAB — RPR: RPR Ser Ql: NONREACTIVE

## 2019-06-11 ENCOUNTER — Telehealth: Payer: Self-pay | Admitting: Pharmacy Technician

## 2019-06-11 NOTE — Telephone Encounter (Signed)
[facility] Patient Advocate Encounter  Received fax notification from [facility] Advancing Access that the patient has been withdrawn from their program effective 04/19/2019. Questions [phone]. Patient appears to now be umap approved.

## 2019-06-13 ENCOUNTER — Telehealth: Payer: Self-pay | Admitting: *Deleted

## 2019-06-13 ENCOUNTER — Other Ambulatory Visit: Payer: Self-pay | Admitting: Infectious Disease

## 2019-06-13 NOTE — Telephone Encounter (Signed)
-----   Message from Randall Hiss, MD sent at 06/04/2019  3:47 PM EDT ----- Regarding: +  DM,  needs  to  start metformin a nd see IM  for primary care I  sent rx for metfomin and see IM clinic for primary care ----- Message ----- From: Janace Hoard Lab Results In Sent: 06/03/2019   3:38 PM EDT To: Randall Hiss, MD

## 2019-06-13 NOTE — Telephone Encounter (Signed)
Called patient using [employer] [phone] to relay message. Patient answered, RN relayed the message and the need for primary care.  He is currently at work, cannot take down the number, would like a text message. He works with [employer]. RN will ask for Manuel's assistance next week to help with this connection. Andree Coss, RN

## 2019-06-16 NOTE — Telephone Encounter (Signed)
Spoke with Kelby Fam who will connect to primary care and confirm all information with patient. Internal Medicine [phone].

## 2019-07-03 ENCOUNTER — Other Ambulatory Visit: Payer: Self-pay

## 2019-07-03 ENCOUNTER — Ambulatory Visit: Payer: Self-pay

## 2019-07-22 ENCOUNTER — Other Ambulatory Visit: Payer: Self-pay

## 2019-08-05 ENCOUNTER — Encounter: Payer: Self-pay | Admitting: Infectious Disease

## 2019-08-11 ENCOUNTER — Other Ambulatory Visit: Payer: Self-pay

## 2019-08-11 ENCOUNTER — Encounter: Payer: Self-pay | Admitting: Infectious Disease

## 2019-08-11 ENCOUNTER — Ambulatory Visit: Payer: Self-pay | Admitting: Infectious Disease

## 2019-08-11 VITALS — BP 124/79 | HR 72 | Temp 98.7°F | Wt 240.0 lb

## 2019-08-11 DIAGNOSIS — B20 Human immunodeficiency virus [HIV] disease: Secondary | ICD-10-CM

## 2019-08-11 DIAGNOSIS — E1165 Type 2 diabetes mellitus with hyperglycemia: Secondary | ICD-10-CM

## 2019-08-11 DIAGNOSIS — E039 Hypothyroidism, unspecified: Secondary | ICD-10-CM

## 2019-08-11 DIAGNOSIS — N182 Chronic kidney disease, stage 2 (mild): Secondary | ICD-10-CM

## 2019-08-11 MED ORDER — BIKTARVY 50-200-25 MG PO TABS: 1.0000 | ORAL_TABLET | Freq: Every day | ORAL | 11 refills | Status: DC

## 2019-08-11 MED ORDER — METFORMIN HCL 500 MG PO TABS: 500.0000 mg | ORAL_TABLET | Freq: Two times a day (BID) | ORAL | 1 refills | Status: DC

## 2019-08-11 MED ORDER — LEVOTHYROXINE SODIUM 200 MCG PO TABS: 200.0000 ug | ORAL_TABLET | Freq: Every day | ORAL | 11 refills | Status: DC

## 2019-08-11 NOTE — Progress Notes (Signed)
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   He had been previously treated with PREZCOBIX and DESCOVY with nice virological suppression recently changed to [facility] with continued suppression.     David Knapp has been relatively well controlled recently.  He does have comorbid hypothyroidism for which she takes Synthroid which costs hime $15 at the [facility].  He has been  Taking both the Synthroid and Biktarvy at the same time and takes it with a meal.  TSH was in range when I checked it.  David Knapp does have diabetes type 2 as well but is not taking his metformin.   Past Medical History:  Diagnosis Date  . Allergic reaction 03/22/2016  . Change in voice 03/22/2016  . CHF (congestive heart failure) (HCC) 04/23/2017  . CKD (chronic kidney disease), stage II   . 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 Possible recurrence 2014 (currently being evaluated 05/2012)  . Edema 04/27/2015  . HIV (human immunodeficiency virus infection) (HCC)   . Hyperglycemia 06/03/2019  . Hypothyroidism 04/24/2017  . Leg swelling 04/2017  . Pancytopenia (HCC)   . 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 Throat swelling 03/22/2016  . Type 2 diabetes mellitus with hyperglycemia (HCC) 06/04/2019    Past Surgical History:  Procedure Laterality Date  . NO PAST SURGERIES      Family History  Problem Relation Age of Onset  . Coronary artery disease Mother        died from MI @ age 64 yo      Social History   Socioeconomic History  . Marital status: Single    Spouse name: Not on file  . Number of children: 0  . Years of education: 6th grade  . Highest education level: Not on file  Occupational  History  . Occupation: Freight forwarder: landscaping  Tobacco Use  . Smoking status: Never Smoker  . Smokeless tobacco: Never Used  Vaping Use  . Vaping Use: Never used  Substance and Sexual Activity  . Alcohol use: No    Alcohol/week: 6.0 standard drinks    Types: 6 Standard drinks or equivalent per week  . Drug use: No  . Sexual activity: Not Currently    Partners: Female    Comment: given condoms 10/2018  Other Topics Concern  . Not on file  Social History Narrative   Lives in [place] with his wife and 3 nephews/nieces. Is spanish speaking only - requiring interpretor. He is able to read and write in Spanish only   Social Determinants of Health   Financial Resource Strain:   . Difficulty of Paying Living Expenses:   Food Insecurity:   . Worried About Programme researcher, broadcasting/film/video in the Last Year:   . Barista in the Last Year:   Transportation Needs:   . Freight forwarder (Medical):   Marland Kitchen Lack of Transportation (Non-Medical):   Physical Activity:   . Days of Exercise per Week:   . Minutes of Exercise per Session:   Stress:   . Feeling of Stress :   Social Connections:   . Frequency of Communication with Friends and Family:   . Frequency of Social Gatherings with Friends and Family:   .  Attends Religious Services:   . Active Member of Clubs or Organizations:   . Attends Banker Meetings:   Marland Kitchen Marital Status:     Allergies  Allergen Reactions  . Sulfa Antibiotics Other (See Comments)    He believes he has experienced throat swelling with prezcobix and Descovy for nearly a year since April 2017 to present March 22, 2016     Current Outpatient Medications:  .  bictegravir-emtricitabine-tenofovir AF (BIKTARVY) 50-200-25 MG TABS tablet, Take 1 tablet by mouth daily., Disp: 30 tablet, Rfl: 11 .  levothyroxine (SYNTHROID) 200 MCG tablet, Take 1 tablet (200 mcg total) by mouth daily before breakfast., Disp: 30 tablet, Rfl: 11 .  metFORMIN  (GLUCOPHAGE) 500 MG tablet, Take 1 tablet (500 mg total) by mouth 2 (two) times daily with a meal. Take one tablet twice daily for 4 weeks and then go to two tablets twice daily, Disp: 120 tablet, Rfl: 1   Review of Systems  Constitutional: Negative for appetite change, chills, diaphoresis and fever.  HENT: Negative for congestion, rhinorrhea, sinus pressure, sneezing, sore throat, trouble swallowing and voice change.   Eyes: Negative for photophobia and visual disturbance.  Respiratory: Negative for chest tightness, shortness of breath, wheezing and stridor.   Cardiovascular: Negative for chest pain and palpitations.  Gastrointestinal: Negative for abdominal distention, abdominal pain, anal bleeding, blood in stool, constipation, diarrhea, nausea and vomiting.  Genitourinary: Negative for difficulty urinating, dysuria, flank pain and hematuria.  Musculoskeletal: Negative for arthralgias, back pain, gait problem, joint swelling and myalgias.  Skin: Negative for pallor and wound.  Allergic/Immunologic: Negative for environmental allergies.  Neurological: Negative for dizziness, tremors, weakness and light-headedness.  Hematological: Negative for adenopathy. Does not bruise/bleed easily.  Psychiatric/Behavioral: Negative for agitation, behavioral problems, confusion, decreased concentration, dysphoric mood, sleep disturbance and suicidal ideas. The patient is not nervous/anxious.      Physical Exam Vitals and nursing note reviewed.  Constitutional:      General: He is not in acute distress.    Appearance: He is not diaphoretic.  HENT:     Head: Normocephalic and atraumatic.     Mouth/Throat:     Pharynx: No oropharyngeal exudate.  Eyes:     General: No scleral icterus.    Conjunctiva/sclera: Conjunctivae normal.  Neck:     Thyroid: No thyromegaly.  Cardiovascular:     Rate and Rhythm: Normal rate and regular rhythm.     Heart sounds: No friction rub.  Pulmonary:     Effort:  Pulmonary effort is normal. No respiratory distress.     Breath sounds: Normal breath sounds. No wheezing.  Abdominal:     General: There is no distension.     Palpations: Abdomen is soft.  Musculoskeletal:        General: No tenderness.     Cervical back: Neck supple.  Skin:    General: Skin is warm and dry.     Coloration: Skin is not pale.     Findings: No erythema.  Neurological:     General: No focal deficit present.     Mental Status: He is alert and oriented to person, place, and time.     Motor: No abnormal muscle tone.     Coordination: Coordination normal.  Psychiatric:        Mood and Affect: Mood normal.        Behavior: Behavior normal.        Thought Content: Thought content normal.  Judgment: Judgment normal.     Skin: Still with pedunculated lesions on his fingers several visits ago      Assessment/Plan:   #1 HIV: continue BIKTARVY prescription. Renew HMAP, return to clinic in January  #2 hypothyroidism: Check TSH   #3  Type 2 diabetes mellitus recent prescription for Metformin I have also made referral to internal medicine  #4 COVID-19 prevention I counseled him to get vaccinated at the [facility] walk-in pharmacy clinic.  Visit conducted with Spanish translation in person

## 2019-08-12 LAB — T-HELPER CELL (CD4) - (RCID CLINIC ONLY)
CD4 % Helper T Cell: 24 % — ABNORMAL LOW (ref 33–65)
CD4 T Cell Abs: 407 /uL (ref 400–1790)

## 2019-08-12 LAB — URINE CYTOLOGY ANCILLARY ONLY
Chlamydia: NEGATIVE
Comment: NEGATIVE
Comment: NORMAL
Neisseria Gonorrhea: NEGATIVE

## 2019-08-14 ENCOUNTER — Telehealth: Payer: Self-pay

## 2019-08-14 LAB — CBC WITH DIFFERENTIAL/PLATELET
Absolute Monocytes: 416 cells/uL (ref 200–950)
Basophils Absolute: 51 cells/uL (ref 0–200)
Basophils Relative: 0.9 %
Eosinophils Absolute: 188 cells/uL (ref 15–500)
Eosinophils Relative: 3.3 %
HCT: 50.2 % — ABNORMAL HIGH (ref 38.5–50.0)
Hemoglobin: 16.8 g/dL (ref 13.2–17.1)
Lymphs Abs: 1733 cells/uL (ref 850–3900)
MCH: 31.1 pg (ref 27.0–33.0)
MCHC: 33.5 g/dL (ref 32.0–36.0)
MCV: 93 fL (ref 80.0–100.0)
MPV: 9.6 fL (ref 7.5–12.5)
Monocytes Relative: 7.3 %
Neutro Abs: 3312 cells/uL (ref 1500–7800)
Neutrophils Relative %: 58.1 %
Platelets: 234 10*3/uL (ref 140–400)
RBC: 5.4 10*6/uL (ref 4.20–5.80)
RDW: 13.9 % (ref 11.0–15.0)
Total Lymphocyte: 30.4 %
WBC: 5.7 10*3/uL (ref 3.8–10.8)

## 2019-08-14 LAB — COMPLETE METABOLIC PANEL WITH GFR
AG Ratio: 1.5 (calc) (ref 1.0–2.5)
ALT: 77 U/L — ABNORMAL HIGH (ref 9–46)
AST: 38 U/L (ref 10–40)
Albumin: 4.5 g/dL (ref 3.6–5.1)
Alkaline phosphatase (APISO): 102 U/L (ref 36–130)
BUN: 17 mg/dL (ref 7–25)
CO2: 24 mmol/L (ref 20–32)
Calcium: 9.3 mg/dL (ref 8.6–10.3)
Chloride: 103 mmol/L (ref 98–110)
Creat: 1.21 mg/dL (ref 0.60–1.35)
GFR, Est African American: 86 mL/min/{1.73_m2} (ref >=60)
GFR, Est Non African American: 74 mL/min/{1.73_m2} (ref >=60)
Globulin: 3.1 g/dL (calc) (ref 1.9–3.7)
Glucose, Bld: 304 mg/dL — ABNORMAL HIGH (ref 65–99)
Potassium: 4.5 mmol/L (ref 3.5–5.3)
Sodium: 134 mmol/L — ABNORMAL LOW (ref 135–146)
Total Bilirubin: 0.9 mg/dL (ref 0.2–1.2)
Total Protein: 7.6 g/dL (ref 6.1–8.1)

## 2019-08-14 LAB — HEMOGLOBIN A1C
Hgb A1c MFr Bld: 8 % of total Hgb — ABNORMAL HIGH (ref <5.7)
Mean Plasma Glucose: 183 (calc)
eAG (mmol/L): 10.1 (calc)

## 2019-08-14 LAB — LIPID PANEL
Cholesterol: 207 mg/dL — ABNORMAL HIGH (ref <200)
HDL: 38 mg/dL — ABNORMAL LOW (ref >=40)
LDL Cholesterol (Calc): 124 mg/dL (calc) — ABNORMAL HIGH
Non-HDL Cholesterol (Calc): 169 mg/dL (calc) — ABNORMAL HIGH (ref <130)
Total CHOL/HDL Ratio: 5.4 (calc) — ABNORMAL HIGH (ref <5.0)
Triglycerides: 316 mg/dL — ABNORMAL HIGH (ref <150)

## 2019-08-14 LAB — RPR: RPR Ser Ql: NONREACTIVE

## 2019-08-14 LAB — TSH: TSH: 12.13 mIU/L — ABNORMAL HIGH (ref 0.40–4.50)

## 2019-08-14 LAB — HIV-1 RNA QUANT-NO REFLEX-BLD
HIV 1 RNA Quant: 22 Copies/mL — ABNORMAL HIGH
HIV-1 RNA Quant, Log: 1.34 Log cps/mL — ABNORMAL HIGH

## 2019-08-14 NOTE — Telephone Encounter (Signed)
-----   Message from Randall Hiss, MD sent at 08/12/2019 11:40 AM EDT ----- May need Manuel;s help pharmacy help. He MUST NOT be taking his thyroid medicine again and he is not taking metformin. He was hospitalized in past due to severe hypothyroidism  Can we make sure he gets both drugs metformin covered by [facility]

## 2019-08-14 NOTE — Telephone Encounter (Signed)
Spoke with David Knapp regarding patient and medication adherence. Will have him leave message requesting he restart his medication to avoid issues from last time. Called [facility] to confirm medication is covered under HMAP, and find out if medication is still covered under plan. Per pharmacist patient has not picked up medication. Is still covered under RW. Is ready for him to pick up. [name], [place]

## 2019-08-14 NOTE — Telephone Encounter (Signed)
Per patient he is taking both medications. Is going to pick up a refill today. [name], [place]

## 2020-01-12 ENCOUNTER — Ambulatory Visit (INDEPENDENT_AMBULATORY_CARE_PROVIDER_SITE_OTHER): Payer: Self-pay | Admitting: Infectious Disease

## 2020-01-12 ENCOUNTER — Other Ambulatory Visit: Payer: Self-pay

## 2020-01-12 ENCOUNTER — Ambulatory Visit: Payer: Self-pay

## 2020-01-12 ENCOUNTER — Encounter: Payer: Self-pay | Admitting: Infectious Disease

## 2020-01-12 VITALS — BP 120/77 | HR 71 | Temp 98.3°F | Wt 227.0 lb

## 2020-01-12 DIAGNOSIS — Z23 Encounter for immunization: Secondary | ICD-10-CM

## 2020-01-12 DIAGNOSIS — E039 Hypothyroidism, unspecified: Secondary | ICD-10-CM

## 2020-01-12 DIAGNOSIS — E1165 Type 2 diabetes mellitus with hyperglycemia: Secondary | ICD-10-CM

## 2020-01-12 DIAGNOSIS — B20 Human immunodeficiency virus [HIV] disease: Secondary | ICD-10-CM

## 2020-01-12 MED ORDER — BIKTARVY 50-200-25 MG PO TABS: 1.0000 | ORAL_TABLET | Freq: Every day | ORAL | 11 refills | Status: DC

## 2020-01-12 MED ORDER — LEVOTHYROXINE SODIUM 200 MCG PO TABS: 200.0000 ug | ORAL_TABLET | Freq: Every day | ORAL | 11 refills | Status: DC

## 2020-01-12 MED ORDER — METFORMIN HCL 500 MG PO TABS: 500.0000 mg | ORAL_TABLET | Freq: Two times a day (BID) | ORAL | 11 refills | Status: DC

## 2020-01-12 NOTE — Progress Notes (Signed)
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   He had been previously treated with PREZCOBIX and DESCOVY with nice virological suppression recently changed to [facility] with continued suppression.     David Knapp has been relatively well controlled recently.  He does have comorbid hypothyroidism for which she takes Synthroid which costs hime $15 at the [facility].  He has been  Taking both the Synthroid and Biktarvy at the same time and takes it with a meal.  TSH was quite elevated last time we checked.  He also has not been taking the Metformin claiming that is not been authorized at the [facility] on [address] not sure if is an issue of needing more refills or not but have sent a new prescription in.  He says he has been taking his Synthroid.  Does need vaccinated gets skin against COVID-19 and will get his flu shot today   Past Medical History:  Diagnosis Date  . Allergic reaction 03/22/2016  . Change in voice 03/22/2016  . CHF (congestive heart failure) (HCC) 04/23/2017  . CKD (chronic kidney disease), stage II   . 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 Possible recurrence 2014 (currently being evaluated 05/2012)  . Edema 04/27/2015  . HIV (human immunodeficiency virus infection) (HCC)   . Hyperglycemia 06/03/2019  . Hypothyroidism 04/24/2017  . Leg swelling 04/2017  . Pancytopenia (HCC)   . 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 Throat swelling 03/22/2016  . Type 2 diabetes mellitus with hyperglycemia (HCC) 06/04/2019    Past Surgical History:  Procedure Laterality Date  . NO PAST SURGERIES      Family History  Problem Relation Age of Onset  . Coronary artery  disease Mother        died from MI @ age 31 yo      Social History   Socioeconomic History  . Marital status: Single    Spouse name: Not on file  . Number of children: 0  . Years of education: 6th grade  . Highest education level: Not on file  Occupational History  . Occupation: Freight forwarder: landscaping  Tobacco Use  . Smoking status: Never Smoker  . Smokeless tobacco: Never Used  Vaping Use  . Vaping Use: Never used  Substance and Sexual Activity  . Alcohol use: No    Alcohol/week: 6.0 standard drinks    Types: 6 Standard drinks or equivalent per week  . Drug use: No  . Sexual activity: Not Currently    Partners: Female    Comment: given condoms 10/2018  Other Topics Concern  . Not on file  Social History Narrative   Lives in [place] with his wife and 3 nephews/nieces. Is spanish speaking only - requiring interpretor. He is able to read and write in Spanish only   Social Determinants of Health   Financial Resource Strain: Not on file  Food Insecurity: Not on file  Transportation Needs: Not on file  Physical Activity: Not on file  Stress: Not on file  Social Connections: Not on file    Allergies  Allergen Reactions  . Sulfa Antibiotics Other (See Comments)    He believes he has experienced throat swelling with prezcobix and Descovy  for nearly a year since April 2017 to present March 22, 2016     Current Outpatient Medications:  .  bictegravir-emtricitabine-tenofovir AF (BIKTARVY) 50-200-25 MG TABS tablet, Take 1 tablet by mouth daily., Disp: 30 tablet, Rfl: 11 .  levothyroxine (SYNTHROID) 200 MCG tablet, Take 1 tablet (200 mcg total) by mouth daily before breakfast., Disp: 30 tablet, Rfl: 11 .  metFORMIN (GLUCOPHAGE) 500 MG tablet, Take 1 tablet (500 mg total) by mouth 2 (two) times daily with a meal. Take one tablet twice daily for 4 weeks and then go to two tablets twice daily, Disp: 120 tablet, Rfl: 1   Review of Systems  Constitutional:  Negative for appetite change, chills, diaphoresis and fever.  HENT: Negative for congestion, dental problem, rhinorrhea, sinus pressure, sneezing, sore throat, trouble swallowing and voice change.   Eyes: Negative for photophobia and visual disturbance.  Respiratory: Negative for chest tightness, shortness of breath, wheezing and stridor.   Cardiovascular: Negative for chest pain and palpitations.  Gastrointestinal: Negative for abdominal distention, abdominal pain, anal bleeding, blood in stool, constipation, diarrhea, nausea and vomiting.  Genitourinary: Negative for difficulty urinating, dysuria, flank pain and hematuria.  Musculoskeletal: Negative for arthralgias, back pain, gait problem, joint swelling and myalgias.  Skin: Negative for pallor and wound.  Allergic/Immunologic: Negative for environmental allergies.  Neurological: Negative for dizziness, tremors, weakness and light-headedness.  Hematological: Negative for adenopathy. Does not bruise/bleed easily.  Psychiatric/Behavioral: Negative for agitation, behavioral problems, confusion, decreased concentration, dysphoric mood, sleep disturbance and suicidal ideas. The patient is not nervous/anxious.      Physical Exam Vitals and nursing note reviewed.  Constitutional:      General: He is not in acute distress.    Appearance: He is not diaphoretic.  HENT:     Head: Normocephalic and atraumatic.     Mouth/Throat:     Pharynx: No oropharyngeal exudate.  Eyes:     General: No scleral icterus.    Extraocular Movements: Extraocular movements intact.     Conjunctiva/sclera: Conjunctivae normal.  Neck:     Thyroid: No thyromegaly.  Cardiovascular:     Rate and Rhythm: Normal rate and regular rhythm.     Heart sounds: No friction rub.  Pulmonary:     Effort: Pulmonary effort is normal. No respiratory distress.     Breath sounds: Normal breath sounds. No wheezing.  Abdominal:     General: There is no distension.     Palpations:  Abdomen is soft.  Musculoskeletal:        General: No tenderness.     Cervical back: Neck supple.  Skin:    General: Skin is warm and dry.     Coloration: Skin is not pale.     Findings: No erythema.  Neurological:     General: No focal deficit present.     Mental Status: He is alert and oriented to person, place, and time.     Motor: No abnormal muscle tone.     Coordination: Coordination normal.  Psychiatric:        Mood and Affect: Mood normal.        Behavior: Behavior normal.        Thought Content: Thought content normal.        Judgment: Judgment normal.     Skin: Still with pedunculated lesions on his fingers several visits ago      Assessment/Plan:   #1 HIV: Continue Biktarvy checking labs today and renewing HMA P  #2 hypothyroidism: Check TSH continue  Synthroid  #3  Type 2 diabetes mellitus prescription sent in for Metformin I have again sent in referral to internal medicine  #4 COVID-19 prevention I counseled him to get vaccinated at the [facility] walk-in pharmacy clinic.    Visit conducted with Spanish translation in person

## 2020-01-13 LAB — URINE CYTOLOGY ANCILLARY ONLY
Chlamydia: NEGATIVE
Comment: NEGATIVE
Comment: NORMAL
Neisseria Gonorrhea: NEGATIVE

## 2020-01-13 LAB — T-HELPER CELL (CD4) - (RCID CLINIC ONLY)
CD4 % Helper T Cell: 27 % — ABNORMAL LOW (ref 33–65)
CD4 T Cell Abs: 390 /uL — ABNORMAL LOW (ref 400–1790)

## 2020-01-18 LAB — CBC WITH DIFFERENTIAL/PLATELET
Absolute Monocytes: 414 cells/uL (ref 200–950)
Basophils Absolute: 28 cells/uL (ref 0–200)
Basophils Relative: 0.5 %
Eosinophils Absolute: 90 cells/uL (ref 15–500)
Eosinophils Relative: 1.6 %
HCT: 48.1 % (ref 38.5–50.0)
Hemoglobin: 16.7 g/dL (ref 13.2–17.1)
Lymphs Abs: 1445 cells/uL (ref 850–3900)
MCH: 31.8 pg (ref 27.0–33.0)
MCHC: 34.7 g/dL (ref 32.0–36.0)
MCV: 91.6 fL (ref 80.0–100.0)
MPV: 10.2 fL (ref 7.5–12.5)
Monocytes Relative: 7.4 %
Neutro Abs: 3623 cells/uL (ref 1500–7800)
Neutrophils Relative %: 64.7 %
Platelets: 207 10*3/uL (ref 140–400)
RBC: 5.25 10*6/uL (ref 4.20–5.80)
RDW: 13.5 % (ref 11.0–15.0)
Total Lymphocyte: 25.8 %
WBC: 5.6 10*3/uL (ref 3.8–10.8)

## 2020-01-18 LAB — LIPID PANEL
Cholesterol: 216 mg/dL — ABNORMAL HIGH (ref <200)
HDL: 36 mg/dL — ABNORMAL LOW (ref >=40)
LDL Cholesterol (Calc): 137 mg/dL (calc) — ABNORMAL HIGH
Non-HDL Cholesterol (Calc): 180 mg/dL (calc) — ABNORMAL HIGH (ref <130)
Total CHOL/HDL Ratio: 6 (calc) — ABNORMAL HIGH (ref <5.0)
Triglycerides: 278 mg/dL — ABNORMAL HIGH (ref <150)

## 2020-01-18 LAB — COMPLETE METABOLIC PANEL WITH GFR
AG Ratio: 1.6 (calc) (ref 1.0–2.5)
ALT: 92 U/L — ABNORMAL HIGH (ref 9–46)
AST: 52 U/L — ABNORMAL HIGH (ref 10–40)
Albumin: 4.4 g/dL (ref 3.6–5.1)
Alkaline phosphatase (APISO): 82 U/L (ref 36–130)
BUN: 16 mg/dL (ref 7–25)
CO2: 28 mmol/L (ref 20–32)
Calcium: 9.4 mg/dL (ref 8.6–10.3)
Chloride: 102 mmol/L (ref 98–110)
Creat: 0.89 mg/dL (ref 0.60–1.35)
GFR, Est African American: 124 mL/min/{1.73_m2} (ref >=60)
GFR, Est Non African American: 107 mL/min/{1.73_m2} (ref >=60)
Globulin: 2.8 g/dL (calc) (ref 1.9–3.7)
Glucose, Bld: 284 mg/dL — ABNORMAL HIGH (ref 65–99)
Potassium: 4.2 mmol/L (ref 3.5–5.3)
Sodium: 137 mmol/L (ref 135–146)
Total Bilirubin: 1.4 mg/dL — ABNORMAL HIGH (ref 0.2–1.2)
Total Protein: 7.2 g/dL (ref 6.1–8.1)

## 2020-01-18 LAB — HEMOGLOBIN A1C
Hgb A1c MFr Bld: 13.1 % of total Hgb — ABNORMAL HIGH (ref <5.7)
Mean Plasma Glucose: 329 mg/dL
eAG (mmol/L): 18.2 mmol/L

## 2020-01-18 LAB — TSH: TSH: 8.51 mIU/L — ABNORMAL HIGH (ref 0.40–4.50)

## 2020-01-18 LAB — HIV-1 RNA QUANT-NO REFLEX-BLD
HIV 1 RNA Quant: 20 Copies/mL — ABNORMAL HIGH
HIV-1 RNA Quant, Log: 1.3 Log cps/mL — ABNORMAL HIGH

## 2020-01-18 LAB — RPR: RPR Ser Ql: NONREACTIVE

## 2020-01-21 ENCOUNTER — Telehealth: Payer: Self-pay

## 2020-01-21 NOTE — Telephone Encounter (Signed)
Called patient with use of [employer] (ID # [phone]) to relay message from Dr. Daiva Eves. Patient was given numbers to PCP offices as he does not have a primary care doctor.   Internal Medicine [facility]  Pt was instructed to contact either office to set up a new patient appointment to be seen to address labs/initiate care.   Aws Shere Loyola Mast, RN

## 2020-01-21 NOTE — Telephone Encounter (Signed)
-----   Message from Randall Hiss, MD sent at 01/19/2020  9:18 AM EST ----- He still has elevated TSH though better slightly a1c high and transaminases and bilirubin Up can we make sure he gets in with primary care and I can also refer to endocrine. Was he drinking night before his labs?

## 2020-02-19 ENCOUNTER — Encounter: Payer: Self-pay | Admitting: Student

## 2020-03-05 ENCOUNTER — Encounter: Payer: Self-pay | Admitting: Infectious Disease

## 2020-03-17 ENCOUNTER — Encounter: Payer: Self-pay | Admitting: Student

## 2020-04-20 ENCOUNTER — Other Ambulatory Visit: Payer: Self-pay | Admitting: Infectious Disease

## 2020-07-12 ENCOUNTER — Telehealth: Payer: Self-pay

## 2020-07-12 ENCOUNTER — Ambulatory Visit: Payer: Self-pay | Admitting: Infectious Disease

## 2020-07-12 NOTE — Telephone Encounter (Signed)
Left voice message asking patient to call our office in regards to appointment at 8:45 this morning.

## 2020-08-25 ENCOUNTER — Other Ambulatory Visit (HOSPITAL_COMMUNITY)
Admission: RE | Admit: 2020-08-25 | Discharge: 2020-08-25 | Disposition: A | Discharge status: Home / Self Care | Payer: Self-pay | Source: Ambulatory Visit | Attending: Infectious Disease | Admitting: Infectious Disease

## 2020-08-25 ENCOUNTER — Encounter: Payer: Self-pay | Admitting: Infectious Disease

## 2020-08-25 ENCOUNTER — Other Ambulatory Visit: Payer: Self-pay

## 2020-08-25 ENCOUNTER — Ambulatory Visit (INDEPENDENT_AMBULATORY_CARE_PROVIDER_SITE_OTHER): Payer: Self-pay | Admitting: Infectious Disease

## 2020-08-25 ENCOUNTER — Ambulatory Visit: Payer: Self-pay

## 2020-08-25 VITALS — BP 99/68 | HR 75 | Temp 98.6°F | Wt 216.2 lb

## 2020-08-25 DIAGNOSIS — N182 Chronic kidney disease, stage 2 (mild): Secondary | ICD-10-CM

## 2020-08-25 DIAGNOSIS — E1165 Type 2 diabetes mellitus with hyperglycemia: Secondary | ICD-10-CM

## 2020-08-25 DIAGNOSIS — B20 Human immunodeficiency virus [HIV] disease: Secondary | ICD-10-CM | POA: Insufficient documentation

## 2020-08-25 DIAGNOSIS — E039 Hypothyroidism, unspecified: Secondary | ICD-10-CM

## 2020-08-25 DIAGNOSIS — B399 Histoplasmosis, unspecified: Secondary | ICD-10-CM

## 2020-08-25 DIAGNOSIS — B078 Other viral warts: Secondary | ICD-10-CM

## 2020-08-25 MED ORDER — METFORMIN HCL 1000 MG PO TABS: 1000.0000 mg | ORAL_TABLET | Freq: Two times a day (BID) | ORAL | 11 refills | Status: DC

## 2020-08-25 MED ORDER — ROSUVASTATIN CALCIUM 20 MG PO TABS: 20.0000 mg | ORAL_TABLET | Freq: Every day | ORAL | 11 refills | Status: DC

## 2020-08-25 MED ORDER — BIKTARVY 50-200-25 MG PO TABS: 1.0000 | ORAL_TABLET | Freq: Every day | ORAL | 11 refills | Status: DC

## 2020-08-25 MED ORDER — LEVOTHYROXINE SODIUM 200 MCG PO TABS: 200.0000 ug | ORAL_TABLET | Freq: Every day | ORAL | 11 refills | Status: DC

## 2020-08-25 NOTE — Progress Notes (Signed)
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   He had been previously treated with PREZCOBIX and DESCOVY with nice virological suppression recently changed to [facility] with continued suppression.     [name] has been relatively well controlled recently.  He does have comorbid hypothyroidism for which she takes Synthroid which costs hime $15 at the [facility].  I last saw him in February he had not been taking the metformin.  We placed him back on metformin 500 mg twice a day with instructions to increase to 1000 twice daily.  TSH was are also still quite elevated I have tried to get him in touch with internal medicine because he clearly needs primary care physician.  He still has not been established with primary care.  Since I last saw him he had been applying some type of engine oil or if I understand correctly to the lesions on his fingers based on instructions from someone in [place].  Indeed these lesions responded and have largely disappeared from his hands.     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   Throat swelling 03/22/2016   Type 2 diabetes  mellitus with hyperglycemia (HCC) 06/04/2019    Past Surgical History:  Procedure Laterality Date   NO PAST SURGERIES      Family History  Problem Relation Age of Onset   Coronary artery disease Mother        died from MI @ age 52 yo      Social History   Socioeconomic History   Marital status: Single    Spouse name: Not on file   Number of children: 0   Years of education: 6th grade   Highest education level: Not on file  Occupational History   Occupation: Freight forwarder: Aeronautical engineer  Tobacco Use   Smoking status: Never   Smokeless tobacco: Never  Vaping Use   Vaping Use: Never used  Substance and Sexual Activity   Alcohol use: No    Alcohol/week: 6.0 standard drinks    Types: 6 Standard drinks or equivalent per week   Drug use: No   Sexual activity: Not Currently    Partners: Female    Comment: declined condoms  Other Topics Concern   Not on file  Social History Narrative   Lives in [place] with his wife and 3 nephews/nieces. Is spanish speaking only - requiring interpretor. He is able to read and write in Spanish only   Social Determinants of Health   Financial Resource Strain: Not on file  Food Insecurity: Not on file  Transportation Needs: Not on file  Physical Activity: Not on file  Stress: Not on file  Social Connections: Not on file  Allergies  Allergen Reactions   Sulfa Antibiotics Other (See Comments)    He believes he has experienced throat swelling with prezcobix and Descovy for nearly a year since April 2017 to present March 22, 2016     Current Outpatient Medications:    bictegravir-emtricitabine-tenofovir AF (BIKTARVY) 50-200-25 MG TABS tablet, Take 1 tablet by mouth daily., Disp: 30 tablet, Rfl: 11   levothyroxine (SYNTHROID) 200 MCG tablet, Take 1 tablet (200 mcg total) by mouth daily before breakfast., Disp: 30 tablet, Rfl: 11   metFORMIN (GLUCOPHAGE) 500 MG tablet, Take 1 tablet (500 mg total) by mouth 2 (two) times daily  with a meal. Take one tablet twice daily for 4 weeks and then go to two tablets twice daily, Disp: 120 tablet, Rfl: 11   Review of Systems  Constitutional:  Negative for activity change, appetite change, chills, diaphoresis, fatigue, fever and unexpected weight change.  HENT:  Negative for congestion, rhinorrhea, sinus pressure, sneezing, sore throat and trouble swallowing.   Eyes:  Negative for photophobia and visual disturbance.  Respiratory:  Negative for cough, chest tightness, shortness of breath, wheezing and stridor.   Cardiovascular:  Negative for chest pain, palpitations and leg swelling.  Gastrointestinal:  Negative for abdominal distention, abdominal pain, anal bleeding, blood in stool, constipation, diarrhea, nausea and vomiting.  Genitourinary:  Negative for difficulty urinating, dysuria, flank pain and hematuria.  Musculoskeletal:  Negative for arthralgias, back pain, gait problem, joint swelling and myalgias.  Skin:  Negative for color change, pallor, rash and wound.  Neurological:  Negative for dizziness, tremors, weakness and light-headedness.  Hematological:  Negative for adenopathy. Does not bruise/bleed easily.  Psychiatric/Behavioral:  Negative for agitation, behavioral problems, confusion, decreased concentration, dysphoric mood and sleep disturbance.     Physical Exam Constitutional:      Appearance: He is well-developed. He is obese.  HENT:     Head: Normocephalic and atraumatic.  Eyes:     Conjunctiva/sclera: Conjunctivae normal.  Cardiovascular:     Rate and Rhythm: Normal rate and regular rhythm.  Pulmonary:     Effort: Pulmonary effort is normal. No respiratory distress.     Breath sounds: No wheezing.  Abdominal:     General: There is no distension.     Palpations: Abdomen is soft.  Musculoskeletal:        General: No tenderness. Normal range of motion.     Cervical back: Normal range of motion and neck supple.  Skin:    General: Skin is warm and dry.      Coloration: Skin is not pale.     Findings: No erythema or rash.  Neurological:     General: No focal deficit present.     Mental Status: He is alert and oriented to person, place, and time.  Psychiatric:        Mood and Affect: Mood normal.        Behavior: Behavior normal.        Thought Content: Thought content normal.        Judgment: Judgment normal.   Skin on hands with viral wart like lesions which have largely disappeared after he been applying motor oil to them.  See picture 08/25/2020:       Assessment/Plan:   #1 HIV disease:  I am continuing his BIKTARVY prescription  I am ordering a viral load with CD4 count CMP with GFR, CBC with differential  #2 hypothyroidism we will check a TSH  I will continue his Synthroid I am referring  him to internal medicine Last TSH was 8.51 when I reviewed them January 2022.  He may need a higher dose of his Synthroid.   #3 diabetes mellitus he appears to be on metformin 500 mg twice daily.  Asked him to change to taking 2 metformin 5 mg tablets twice daily and I have sent a new prescription to his pharmacy [facility] with 1000 mg twice daily.  I am also checking hemoglobin A1c.  Counseled him that it is critical you establish care with primary care so that he can be followed closely for his diabetes mellitus. Last A1c was 13.1 and I suspect he will need insulin or certainly more oral agents besides metformin   #4 hyperlipidemia: Last lipid panel on January 12, 2020 showed an LDL of 137.  He clearly needs to be on a statin given his diabetes mellitus  I am starting him on Crestor 20mg  Checking lipids panel.  #5  No wartlike lesions: These apparently was responded to being treated with topical oil products    Visit conducted with Spanish translation in person

## 2020-08-26 ENCOUNTER — Other Ambulatory Visit: Payer: Self-pay | Admitting: Infectious Disease

## 2020-08-26 ENCOUNTER — Telehealth: Payer: Self-pay | Admitting: Infectious Disease

## 2020-08-26 LAB — T-HELPER CELL (CD4) - (RCID CLINIC ONLY)
CD4 % Helper T Cell: 28 % — ABNORMAL LOW (ref 33–65)
CD4 T Cell Abs: 378 /uL — ABNORMAL LOW (ref 400–1790)

## 2020-08-26 LAB — URINE CYTOLOGY ANCILLARY ONLY
Chlamydia: NEGATIVE
Comment: NEGATIVE
Comment: NORMAL
Neisseria Gonorrhea: NEGATIVE

## 2020-08-26 MED ORDER — LEVOTHYROXINE SODIUM 50 MCG PO TABS: 50.0000 ug | ORAL_TABLET | Freq: Every day | ORAL | 11 refills | Status: DC

## 2020-08-26 NOTE — Telephone Encounter (Signed)
Upping dose of synthroid to 250 mcg

## 2020-08-29 LAB — RPR: RPR Ser Ql: NONREACTIVE

## 2020-08-29 LAB — LIPID PANEL
Cholesterol: 149 mg/dL (ref <200)
HDL: 29 mg/dL — ABNORMAL LOW (ref >=40)
LDL Cholesterol (Calc): 85 mg/dL (calc)
Non-HDL Cholesterol (Calc): 120 mg/dL (calc) (ref <130)
Total CHOL/HDL Ratio: 5.1 (calc) — ABNORMAL HIGH (ref <5.0)
Triglycerides: 269 mg/dL — ABNORMAL HIGH (ref <150)

## 2020-08-29 LAB — CBC WITH DIFFERENTIAL/PLATELET
Absolute Monocytes: 374 cells/uL (ref 200–950)
Basophils Absolute: 42 cells/uL (ref 0–200)
Basophils Relative: 1 %
Eosinophils Absolute: 59 cells/uL (ref 15–500)
Eosinophils Relative: 1.4 %
HCT: 45.9 % (ref 38.5–50.0)
Hemoglobin: 15.8 g/dL (ref 13.2–17.1)
Lymphs Abs: 1781 cells/uL (ref 850–3900)
MCH: 32.2 pg (ref 27.0–33.0)
MCHC: 34.4 g/dL (ref 32.0–36.0)
MCV: 93.5 fL (ref 80.0–100.0)
MPV: 9.5 fL (ref 7.5–12.5)
Monocytes Relative: 8.9 %
Neutro Abs: 1945 cells/uL (ref 1500–7800)
Neutrophils Relative %: 46.3 %
Platelets: 163 10*3/uL (ref 140–400)
RBC: 4.91 10*6/uL (ref 4.20–5.80)
RDW: 12.9 % (ref 11.0–15.0)
Total Lymphocyte: 42.4 %
WBC: 4.2 10*3/uL (ref 3.8–10.8)

## 2020-08-29 LAB — HIV-1 RNA QUANT-NO REFLEX-BLD
HIV 1 RNA Quant: <20 Copies/mL — ABNORMAL HIGH
HIV-1 RNA Quant, Log: <1.3 Log cps/mL — ABNORMAL HIGH

## 2020-08-29 LAB — COMPLETE METABOLIC PANEL WITH GFR
AG Ratio: 1.5 (calc) (ref 1.0–2.5)
ALT: 37 U/L (ref 9–46)
AST: 31 U/L (ref 10–40)
Albumin: 4.3 g/dL (ref 3.6–5.1)
Alkaline phosphatase (APISO): 90 U/L (ref 36–130)
BUN: 12 mg/dL (ref 7–25)
CO2: 26 mmol/L (ref 20–32)
Calcium: 9.1 mg/dL (ref 8.6–10.3)
Chloride: 105 mmol/L (ref 98–110)
Creat: 0.84 mg/dL (ref 0.60–1.29)
Globulin: 2.9 g/dL (calc) (ref 1.9–3.7)
Glucose, Bld: 213 mg/dL — ABNORMAL HIGH (ref 65–99)
Potassium: 3.9 mmol/L (ref 3.5–5.3)
Sodium: 137 mmol/L (ref 135–146)
Total Bilirubin: 1.5 mg/dL — ABNORMAL HIGH (ref 0.2–1.2)
Total Protein: 7.2 g/dL (ref 6.1–8.1)
eGFR: 112 mL/min/{1.73_m2} (ref >=60)

## 2020-08-29 LAB — HEMOGLOBIN A1C
Hgb A1c MFr Bld: 11.8 % of total Hgb — ABNORMAL HIGH (ref <5.7)
Mean Plasma Glucose: 292 mg/dL
eAG (mmol/L): 16.2 mmol/L

## 2020-08-29 LAB — TSH: TSH: 9.03 mIU/L — ABNORMAL HIGH (ref 0.40–4.50)

## 2020-09-01 ENCOUNTER — Ambulatory Visit: Payer: Self-pay

## 2020-09-08 ENCOUNTER — Encounter: Payer: Self-pay | Admitting: Infectious Disease

## 2020-11-19 ENCOUNTER — Ambulatory Visit: Payer: Self-pay | Admitting: Infectious Disease

## 2020-12-07 ENCOUNTER — Observation Stay (HOSPITAL_COMMUNITY)
Admission: EM | Admit: 2020-12-07 | Discharge: 2020-12-09 | Disposition: A | Discharge status: Home / Self Care | Payer: Self-pay | Attending: General Surgery | Admitting: General Surgery

## 2020-12-07 ENCOUNTER — Encounter (HOSPITAL_COMMUNITY): Payer: Self-pay

## 2020-12-07 ENCOUNTER — Emergency Department (HOSPITAL_COMMUNITY): Payer: Self-pay

## 2020-12-07 DIAGNOSIS — K8012 Calculus of gallbladder with acute and chronic cholecystitis without obstruction: Principal | ICD-10-CM | POA: Insufficient documentation

## 2020-12-07 DIAGNOSIS — I509 Heart failure, unspecified: Secondary | ICD-10-CM | POA: Insufficient documentation

## 2020-12-07 DIAGNOSIS — N182 Chronic kidney disease, stage 2 (mild): Secondary | ICD-10-CM | POA: Insufficient documentation

## 2020-12-07 DIAGNOSIS — E1122 Type 2 diabetes mellitus with diabetic chronic kidney disease: Secondary | ICD-10-CM | POA: Insufficient documentation

## 2020-12-07 DIAGNOSIS — K8050 Calculus of bile duct without cholangitis or cholecystitis without obstruction: Secondary | ICD-10-CM | POA: Diagnosis present

## 2020-12-07 DIAGNOSIS — E039 Hypothyroidism, unspecified: Secondary | ICD-10-CM | POA: Insufficient documentation

## 2020-12-07 DIAGNOSIS — K8000 Calculus of gallbladder with acute cholecystitis without obstruction: Secondary | ICD-10-CM

## 2020-12-07 DIAGNOSIS — Z20822 Contact with and (suspected) exposure to covid-19: Secondary | ICD-10-CM | POA: Insufficient documentation

## 2020-12-07 DIAGNOSIS — I130 Hypertensive heart and chronic kidney disease with heart failure and stage 1 through stage 4 chronic kidney disease, or unspecified chronic kidney disease: Secondary | ICD-10-CM | POA: Insufficient documentation

## 2020-12-07 DIAGNOSIS — R1011 Right upper quadrant pain: Secondary | ICD-10-CM

## 2020-12-07 DIAGNOSIS — B20 Human immunodeficiency virus [HIV] disease: Secondary | ICD-10-CM | POA: Insufficient documentation

## 2020-12-07 LAB — URINALYSIS, ROUTINE W REFLEX MICROSCOPIC
Bilirubin Urine: NEGATIVE
Glucose, UA: 100 mg/dL — AB
Hgb urine dipstick: NEGATIVE
Ketones, ur: NEGATIVE mg/dL
Leukocytes,Ua: NEGATIVE
Nitrite: NEGATIVE
Protein, ur: NEGATIVE mg/dL
Specific Gravity, Urine: 1.02 (ref 1.005–1.030)
pH: 7 (ref 5.0–8.0)

## 2020-12-07 LAB — CBC WITH DIFFERENTIAL/PLATELET
Abs Immature Granulocytes: 0.04 10*3/uL (ref 0.00–0.07)
Basophils Absolute: 0 10*3/uL (ref 0.0–0.1)
Basophils Relative: 0 %
Eosinophils Absolute: 0.1 10*3/uL (ref 0.0–0.5)
Eosinophils Relative: 2 %
HCT: 45 % (ref 39.0–52.0)
Hemoglobin: 15.5 g/dL (ref 13.0–17.0)
Immature Granulocytes: 1 %
Lymphocytes Relative: 24 %
Lymphs Abs: 1.7 10*3/uL (ref 0.7–4.0)
MCH: 31.2 pg (ref 26.0–34.0)
MCHC: 34.4 g/dL (ref 30.0–36.0)
MCV: 90.5 fL (ref 80.0–100.0)
Monocytes Absolute: 0.5 10*3/uL (ref 0.1–1.0)
Monocytes Relative: 7 %
Neutro Abs: 4.7 10*3/uL (ref 1.7–7.7)
Neutrophils Relative %: 66 %
Platelets: 212 10*3/uL (ref 150–400)
RBC: 4.97 MIL/uL (ref 4.22–5.81)
RDW: 14.5 % (ref 11.5–15.5)
WBC: 7 10*3/uL (ref 4.0–10.5)
nRBC: 0 % (ref 0.0–0.2)

## 2020-12-07 LAB — COMPREHENSIVE METABOLIC PANEL
ALT: 72 U/L — ABNORMAL HIGH (ref 0–44)
AST: 91 U/L — ABNORMAL HIGH (ref 15–41)
Albumin: 4.2 g/dL (ref 3.5–5.0)
Alkaline Phosphatase: 74 U/L (ref 38–126)
Anion gap: 9 (ref 5–15)
BUN: 13 mg/dL (ref 6–20)
CO2: 25 mmol/L (ref 22–32)
Calcium: 9.2 mg/dL (ref 8.9–10.3)
Chloride: 100 mmol/L (ref 98–111)
Creatinine, Ser: 1.2 mg/dL (ref 0.61–1.24)
GFR, Estimated: >60 mL/min (ref >60)
Glucose, Bld: 161 mg/dL — ABNORMAL HIGH (ref 70–99)
Potassium: 3.4 mmol/L — ABNORMAL LOW (ref 3.5–5.1)
Sodium: 134 mmol/L — ABNORMAL LOW (ref 135–145)
Total Bilirubin: 1.3 mg/dL — ABNORMAL HIGH (ref 0.3–1.2)
Total Protein: 7.4 g/dL (ref 6.5–8.1)

## 2020-12-07 LAB — LIPASE, BLOOD: Lipase: 26 U/L (ref 11–51)

## 2020-12-07 MED ORDER — FENTANYL CITRATE PF 50 MCG/ML IJ SOSY
25.0000 ug | PREFILLED_SYRINGE | Freq: Once | INTRAMUSCULAR | Status: AC
  Administered 2020-12-07: 25 ug via INTRAMUSCULAR
  Filled 2020-12-07: qty 1

## 2020-12-07 NOTE — ED Provider Notes (Signed)
Emergency Medicine Provider Triage Evaluation Note  David Knapp , a 40 y.o. male  was evaluated in triage.  Pt complains of epigastric and right upper quadrant abdominal pain that started after eating earlier this morning.  Denies nausea, vomiting, diarrhea.  No previous abdominal operations.  No fever or chills.  Admits to occasional alcohol use.  No chronic NSAIDs.  Review of Systems  Positive: Abdominal pain Negative: N/V/D  Physical Exam  BP 135/79 (BP Location: Left Arm)   Pulse 63   Temp 99.6 F (37.6 C) (Oral)   Resp (!) 21   SpO2 100%  Gen:   Awake, no distress   Resp:  Normal effort  MSK:   Moves extremities without difficulty  Other:  +TTP epigastric and RUQ  Medical Decision Making  Medically screening exam initiated at 9:43 PM.  Appropriate orders placed.  David Knapp was informed that the remainder of the evaluation will be completed by another provider, this initial triage assessment does not replace that evaluation, and the importance of remaining in the ED until their evaluation is complete.  Abdominal labs ordered RUQ [place] to rule out gallbladder etiology   David Knapp 12/07/20 2145    David Sprout, MD 12/07/20 2155

## 2020-12-07 NOTE — ED Triage Notes (Signed)
Pt arrives POV for eval of RUQ abd onset earlier today after eating. States some epigastric pain as well, denies associated N/V/D.

## 2020-12-08 ENCOUNTER — Other Ambulatory Visit: Payer: Self-pay

## 2020-12-08 ENCOUNTER — Observation Stay (HOSPITAL_COMMUNITY): Payer: Self-pay | Admitting: Anesthesiology

## 2020-12-08 ENCOUNTER — Encounter (HOSPITAL_COMMUNITY)
Admission: EM | Disposition: A | Discharge status: Home / Self Care | Payer: Self-pay | Source: Home / Self Care | Attending: Emergency Medicine

## 2020-12-08 ENCOUNTER — Encounter (HOSPITAL_COMMUNITY): Payer: Self-pay

## 2020-12-08 DIAGNOSIS — K8050 Calculus of bile duct without cholangitis or cholecystitis without obstruction: Secondary | ICD-10-CM | POA: Diagnosis present

## 2020-12-08 HISTORY — PX: CHOLECYSTECTOMY: SHX55

## 2020-12-08 LAB — CBC
HCT: 44.2 % (ref 39.0–52.0)
Hemoglobin: 15.2 g/dL (ref 13.0–17.0)
MCH: 31.3 pg (ref 26.0–34.0)
MCHC: 34.4 g/dL (ref 30.0–36.0)
MCV: 90.9 fL (ref 80.0–100.0)
Platelets: 183 10*3/uL (ref 150–400)
RBC: 4.86 MIL/uL (ref 4.22–5.81)
RDW: 14.5 % (ref 11.5–15.5)
WBC: 7.7 10*3/uL (ref 4.0–10.5)
nRBC: 0 % (ref 0.0–0.2)

## 2020-12-08 LAB — RESP PANEL BY RT-PCR (FLU A&B, COVID) ARPGX2
Influenza A by PCR: POSITIVE — AB
Influenza B by PCR: NEGATIVE
SARS Coronavirus 2 by RT PCR: NEGATIVE

## 2020-12-08 LAB — GLUCOSE, CAPILLARY
Glucose-Capillary: 161 mg/dL — ABNORMAL HIGH (ref 70–99)
Glucose-Capillary: 162 mg/dL — ABNORMAL HIGH (ref 70–99)
Glucose-Capillary: 211 mg/dL — ABNORMAL HIGH (ref 70–99)

## 2020-12-08 LAB — CBG MONITORING, ED: Glucose-Capillary: 129 mg/dL — ABNORMAL HIGH (ref 70–99)

## 2020-12-08 LAB — HEMOGLOBIN A1C
Hgb A1c MFr Bld: 8.1 % — ABNORMAL HIGH (ref 4.8–5.6)
Mean Plasma Glucose: 185.77 mg/dL

## 2020-12-08 SURGERY — LAPAROSCOPIC CHOLECYSTECTOMY
Anesthesia: General | Site: Abdomen

## 2020-12-08 MED ORDER — FENTANYL CITRATE (PF) 100 MCG/2ML IJ SOLN: 25.0000 ug | INTRAMUSCULAR | Status: DC | PRN

## 2020-12-08 MED ORDER — INDOCYANINE GREEN 25 MG IV SOLR: 1.2500 mg | INTRAVENOUS | Status: DC

## 2020-12-08 MED ORDER — METOPROLOL TARTRATE 5 MG/5ML IV SOLN: 5.0000 mg | Freq: Four times a day (QID) | INTRAVENOUS | Status: DC | PRN

## 2020-12-08 MED ORDER — DIPHENHYDRAMINE HCL 50 MG/ML IJ SOLN: 25.0000 mg | Freq: Four times a day (QID) | INTRAMUSCULAR | Status: DC | PRN

## 2020-12-08 MED ORDER — SODIUM CHLORIDE 0.9 % IV SOLN
2.0000 g | Freq: Once | INTRAVENOUS | Status: AC
  Administered 2020-12-08: 2 g via INTRAVENOUS
  Filled 2020-12-08: qty 20

## 2020-12-08 MED ORDER — MIDAZOLAM HCL 2 MG/2ML IJ SOLN
INTRAMUSCULAR | Status: AC
  Filled 2020-12-08: qty 2

## 2020-12-08 MED ORDER — BUPIVACAINE-EPINEPHRINE 0.25% -1:200000 IJ SOLN
INTRAMUSCULAR | Status: DC | PRN
  Administered 2020-12-08: 8 mL

## 2020-12-08 MED ORDER — TRAMADOL HCL 50 MG PO TABS: 100.0000 mg | ORAL_TABLET | Freq: Four times a day (QID) | ORAL | Status: DC | PRN

## 2020-12-08 MED ORDER — DIPHENHYDRAMINE HCL 50 MG/ML IJ SOLN
INTRAMUSCULAR | Status: AC
  Filled 2020-12-08: qty 1

## 2020-12-08 MED ORDER — OXYCODONE HCL 5 MG/5ML PO SOLN: 5.0000 mg | Freq: Once | ORAL | Status: DC | PRN

## 2020-12-08 MED ORDER — MORPHINE SULFATE (PF) 2 MG/ML IV SOLN: 1.0000 mg | INTRAVENOUS | Status: DC | PRN

## 2020-12-08 MED ORDER — SUGAMMADEX SODIUM 200 MG/2ML IV SOLN
INTRAVENOUS | Status: DC | PRN
Start: 2020-12-08 — End: 2020-12-08
  Administered 2020-12-08: 200 mg via INTRAVENOUS

## 2020-12-08 MED ORDER — LIDOCAINE 2% (20 MG/ML) 5 ML SYRINGE
INTRAMUSCULAR | Status: DC | PRN
  Administered 2020-12-08: 60 mg via INTRAVENOUS

## 2020-12-08 MED ORDER — MELATONIN 3 MG PO TABS: 3.0000 mg | ORAL_TABLET | Freq: Every evening | ORAL | Status: DC | PRN

## 2020-12-08 MED ORDER — CHLORHEXIDINE GLUCONATE 0.12 % MT SOLN: 15.0000 mL | Freq: Once | OROMUCOSAL | Status: DC

## 2020-12-08 MED ORDER — ONDANSETRON HCL 4 MG/2ML IJ SOLN
INTRAMUSCULAR | Status: AC
  Filled 2020-12-08: qty 2

## 2020-12-08 MED ORDER — POTASSIUM CHLORIDE IN NACL 20-0.9 MEQ/L-% IV SOLN
INTRAVENOUS | Status: DC
  Filled 2020-12-08: qty 1000

## 2020-12-08 MED ORDER — ONDANSETRON 4 MG PO TBDP: 4.0000 mg | ORAL_TABLET | Freq: Four times a day (QID) | ORAL | Status: DC | PRN

## 2020-12-08 MED ORDER — ROPIVACAINE HCL 5 MG/ML IJ SOLN
INTRAMUSCULAR | Status: DC | PRN
  Administered 2020-12-08: 20 mL via PERINEURAL

## 2020-12-08 MED ORDER — ACETAMINOPHEN 500 MG PO TABS
1000.0000 mg | ORAL_TABLET | Freq: Four times a day (QID) | ORAL | Status: DC
  Administered 2020-12-08 – 2020-12-09 (×2): 1000 mg via ORAL
  Filled 2020-12-08 (×3): qty 2

## 2020-12-08 MED ORDER — TRAMADOL HCL 50 MG PO TABS: 50.0000 mg | ORAL_TABLET | Freq: Four times a day (QID) | ORAL | Status: DC | PRN

## 2020-12-08 MED ORDER — PROPOFOL 10 MG/ML IV BOLUS
INTRAVENOUS | Status: AC
  Filled 2020-12-08: qty 20

## 2020-12-08 MED ORDER — HEMOSTATIC AGENTS (NO CHARGE) OPTIME
TOPICAL | Status: DC | PRN
  Administered 2020-12-08 (×2): 1 via TOPICAL

## 2020-12-08 MED ORDER — 0.9 % SODIUM CHLORIDE (POUR BTL) OPTIME
TOPICAL | Status: DC | PRN
  Administered 2020-12-08: 1000 mL

## 2020-12-08 MED ORDER — LACTATED RINGERS IV SOLN: INTRAVENOUS | Status: DC

## 2020-12-08 MED ORDER — LEVOTHYROXINE SODIUM 50 MCG PO TABS
250.0000 ug | ORAL_TABLET | Freq: Every day | ORAL | Status: DC
  Administered 2020-12-09: 250 ug via ORAL
  Filled 2020-12-08: qty 1

## 2020-12-08 MED ORDER — ROCURONIUM BROMIDE 10 MG/ML (PF) SYRINGE
PREFILLED_SYRINGE | INTRAVENOUS | Status: DC | PRN
Start: 2020-12-08 — End: 2020-12-08
  Administered 2020-12-08: 50 mg via INTRAVENOUS

## 2020-12-08 MED ORDER — SODIUM CHLORIDE 0.9 % IR SOLN
Status: DC | PRN
  Administered 2020-12-08: 1000 mL

## 2020-12-08 MED ORDER — ROCURONIUM BROMIDE 10 MG/ML (PF) SYRINGE
PREFILLED_SYRINGE | INTRAVENOUS | Status: AC
  Filled 2020-12-08: qty 10

## 2020-12-08 MED ORDER — PHENYLEPHRINE 40 MCG/ML (10ML) SYRINGE FOR IV PUSH (FOR BLOOD PRESSURE SUPPORT)
PREFILLED_SYRINGE | INTRAVENOUS | Status: DC | PRN
  Administered 2020-12-08: 80 ug via INTRAVENOUS
  Administered 2020-12-08: 40 ug via INTRAVENOUS
  Administered 2020-12-08: 80 ug via INTRAVENOUS

## 2020-12-08 MED ORDER — FENTANYL CITRATE (PF) 100 MCG/2ML IJ SOLN: 100.0000 ug | Freq: Once | INTRAMUSCULAR | Status: AC

## 2020-12-08 MED ORDER — OXYCODONE HCL 5 MG PO TABS: 5.0000 mg | ORAL_TABLET | Freq: Once | ORAL | Status: DC | PRN

## 2020-12-08 MED ORDER — ORAL CARE MOUTH RINSE: 15.0000 mL | Freq: Once | OROMUCOSAL | Status: DC

## 2020-12-08 MED ORDER — ENOXAPARIN SODIUM 40 MG/0.4ML IJ SOSY
40.0000 mg | PREFILLED_SYRINGE | INTRAMUSCULAR | Status: DC
  Administered 2020-12-09: 40 mg via SUBCUTANEOUS
  Filled 2020-12-08: qty 0.4

## 2020-12-08 MED ORDER — INSULIN ASPART 100 UNIT/ML IJ SOLN
0.0000 [IU] | Freq: Three times a day (TID) | INTRAMUSCULAR | Status: DC
Start: 2020-12-08 — End: 2020-12-09
  Administered 2020-12-08: 3 [IU] via SUBCUTANEOUS

## 2020-12-08 MED ORDER — ONDANSETRON HCL 4 MG/2ML IJ SOLN: 4.0000 mg | Freq: Four times a day (QID) | INTRAMUSCULAR | Status: DC | PRN

## 2020-12-08 MED ORDER — DIPHENHYDRAMINE HCL 25 MG PO CAPS: 25.0000 mg | ORAL_CAPSULE | Freq: Four times a day (QID) | ORAL | Status: DC | PRN

## 2020-12-08 MED ORDER — CHLORHEXIDINE GLUCONATE 0.12 % MT SOLN: 15.0000 mL | Freq: Once | OROMUCOSAL | Status: AC

## 2020-12-08 MED ORDER — LEVOTHYROXINE SODIUM 25 MCG PO TABS: 50.0000 ug | ORAL_TABLET | Freq: Every day | ORAL | Status: DC

## 2020-12-08 MED ORDER — SODIUM CHLORIDE 0.9 % IV BOLUS
1000.0000 mL | Freq: Once | INTRAVENOUS | Status: AC
  Administered 2020-12-08: 1000 mL via INTRAVENOUS

## 2020-12-08 MED ORDER — POTASSIUM CHLORIDE IN NACL 20-0.9 MEQ/L-% IV SOLN: INTRAVENOUS | Status: DC

## 2020-12-08 MED ORDER — MIDAZOLAM HCL 2 MG/2ML IJ SOLN: 2.0000 mg | Freq: Once | INTRAMUSCULAR | Status: AC

## 2020-12-08 MED ORDER — LEVOTHYROXINE SODIUM 100 MCG PO TABS: 200.0000 ug | ORAL_TABLET | Freq: Every day | ORAL | Status: DC

## 2020-12-08 MED ORDER — FENTANYL CITRATE (PF) 250 MCG/5ML IJ SOLN
INTRAMUSCULAR | Status: DC | PRN
  Administered 2020-12-08 (×2): 50 ug via INTRAVENOUS

## 2020-12-08 MED ORDER — DEXAMETHASONE SODIUM PHOSPHATE 10 MG/ML IJ SOLN
INTRAMUSCULAR | Status: DC | PRN
  Administered 2020-12-08: 5 mg via INTRAVENOUS

## 2020-12-08 MED ORDER — FENTANYL CITRATE (PF) 100 MCG/2ML IJ SOLN
INTRAMUSCULAR | Status: AC
  Administered 2020-12-08: 100 ug via INTRAVENOUS
  Filled 2020-12-08: qty 2

## 2020-12-08 MED ORDER — ONDANSETRON HCL 4 MG/2ML IJ SOLN
INTRAMUSCULAR | Status: DC | PRN
  Administered 2020-12-08: 4 mg via INTRAVENOUS

## 2020-12-08 MED ORDER — BICTEGRAVIR-EMTRICITAB-TENOFOV 50-200-25 MG PO TABS
1.0000 | ORAL_TABLET | Freq: Every day | ORAL | Status: DC
  Administered 2020-12-09: 1 via ORAL
  Filled 2020-12-08 (×2): qty 1

## 2020-12-08 MED ORDER — PHENYLEPHRINE 40 MCG/ML (10ML) SYRINGE FOR IV PUSH (FOR BLOOD PRESSURE SUPPORT)
PREFILLED_SYRINGE | INTRAVENOUS | Status: AC
  Filled 2020-12-08: qty 10

## 2020-12-08 MED ORDER — MIDAZOLAM HCL 2 MG/2ML IJ SOLN
INTRAMUSCULAR | Status: AC
  Administered 2020-12-08: 2 mg via INTRAVENOUS
  Filled 2020-12-08: qty 2

## 2020-12-08 MED ORDER — DEXAMETHASONE SODIUM PHOSPHATE 10 MG/ML IJ SOLN
INTRAMUSCULAR | Status: AC
  Filled 2020-12-08: qty 1

## 2020-12-08 MED ORDER — SIMETHICONE 80 MG PO CHEW: 40.0000 mg | CHEWABLE_TABLET | Freq: Four times a day (QID) | ORAL | Status: DC | PRN

## 2020-12-08 MED ORDER — ENOXAPARIN SODIUM 40 MG/0.4ML IJ SOSY: 40.0000 mg | PREFILLED_SYRINGE | INTRAMUSCULAR | Status: DC

## 2020-12-08 MED ORDER — ORAL CARE MOUTH RINSE: 15.0000 mL | Freq: Once | OROMUCOSAL | Status: AC

## 2020-12-08 MED ORDER — FENTANYL CITRATE (PF) 250 MCG/5ML IJ SOLN
INTRAMUSCULAR | Status: AC
  Filled 2020-12-08: qty 5

## 2020-12-08 MED ORDER — LIDOCAINE 2% (20 MG/ML) 5 ML SYRINGE
INTRAMUSCULAR | Status: AC
  Filled 2020-12-08: qty 5

## 2020-12-08 MED ORDER — ROSUVASTATIN CALCIUM 20 MG PO TABS
20.0000 mg | ORAL_TABLET | Freq: Every day | ORAL | Status: DC
  Administered 2020-12-09: 20 mg via ORAL
  Filled 2020-12-08: qty 1

## 2020-12-08 MED ORDER — CHLORHEXIDINE GLUCONATE 0.12 % MT SOLN
OROMUCOSAL | Status: AC
  Administered 2020-12-08: 15 mL via OROMUCOSAL
  Filled 2020-12-08: qty 15

## 2020-12-08 MED ORDER — PROPOFOL 10 MG/ML IV BOLUS
INTRAVENOUS | Status: DC | PRN
  Administered 2020-12-08: 200 mg via INTRAVENOUS

## 2020-12-08 SURGICAL SUPPLY — 45 items
ADH SKN CLS APL DERMABOND .7 (GAUZE/BANDAGES/DRESSINGS) ×2
APL PRP STRL LF DISP 70% ISPRP (MISCELLANEOUS) ×2
APPLIER CLIP 5 13 M/L LIGAMAX5 (MISCELLANEOUS) ×6
APR CLP MED LRG 5 ANG JAW (MISCELLANEOUS) ×4
BAG COUNTER SPONGE SURGICOUNT (BAG) ×3 IMPLANT
BAG SPEC RTRVL 10 TROC 200 (ENDOMECHANICALS) ×2
BAG SPNG CNTER NS LX DISP (BAG) ×2
BLADE CLIPPER SURG (BLADE) ×2 IMPLANT
CANISTER SUCT 3000ML PPV (MISCELLANEOUS) ×3 IMPLANT
CHLORAPREP W/TINT 26 (MISCELLANEOUS) ×3 IMPLANT
CLIP APPLIE 5 13 M/L LIGAMAX5 (MISCELLANEOUS) ×3 IMPLANT
COVER MAYO STAND STRL (DRAPES) IMPLANT
COVER SURGICAL LIGHT HANDLE (MISCELLANEOUS) ×3 IMPLANT
DERMABOND ADVANCED (GAUZE/BANDAGES/DRESSINGS) ×1
DERMABOND ADVANCED .7 DNX12 (GAUZE/BANDAGES/DRESSINGS) ×2 IMPLANT
DRAPE C-ARM 42X120 X-RAY (DRAPES) IMPLANT
ELECT REM PT RETURN 9FT ADLT (ELECTROSURGICAL) ×3
ELECTRODE REM PT RTRN 9FT ADLT (ELECTROSURGICAL) ×2 IMPLANT
GLOVE SURG ENC MOIS LTX SZ7 (GLOVE) ×3 IMPLANT
GLOVE SURG UNDER POLY LF SZ7.5 (GLOVE) ×3 IMPLANT
GOWN STRL REUS W/ TWL LRG LVL3 (GOWN DISPOSABLE) ×7 IMPLANT
GOWN STRL REUS W/TWL LRG LVL3 (GOWN DISPOSABLE) ×12
GRASPER SUT TROCAR 14GX15 (MISCELLANEOUS) ×3 IMPLANT
HEMOSTAT SNOW SURGICEL 2X4 (HEMOSTASIS) ×4 IMPLANT
KIT BASIN OR (CUSTOM PROCEDURE TRAY) ×3 IMPLANT
KIT TURNOVER KIT B (KITS) ×3 IMPLANT
NS IRRIG 1000ML POUR BTL (IV SOLUTION) ×3 IMPLANT
PAD ARMBOARD 7.5X6 YLW CONV (MISCELLANEOUS) ×3 IMPLANT
POUCH RETRIEVAL ECOSAC 10 (ENDOMECHANICALS) ×2 IMPLANT
POUCH RETRIEVAL ECOSAC 10MM (ENDOMECHANICALS) ×3
SCISSORS LAP 5X35 DISP (ENDOMECHANICALS) ×3 IMPLANT
SET CHOLANGIOGRAPH 5 50 .035 (SET/KITS/TRAYS/PACK) IMPLANT
SET IRRIG TUBING LAPAROSCOPIC (IRRIGATION / IRRIGATOR) ×3 IMPLANT
SET TUBE SMOKE EVAC HIGH FLOW (TUBING) ×3 IMPLANT
SLEEVE ENDOPATH XCEL 5M (ENDOMECHANICALS) ×6 IMPLANT
SPECIMEN JAR SMALL (MISCELLANEOUS) ×3 IMPLANT
STRIP CLOSURE SKIN 1/2X4 (GAUZE/BANDAGES/DRESSINGS) ×3 IMPLANT
SUT MNCRL AB 4-0 PS2 18 (SUTURE) ×3 IMPLANT
SUT VICRYL 0 UR6 27IN ABS (SUTURE) ×3 IMPLANT
TOWEL GREEN STERILE (TOWEL DISPOSABLE) ×3 IMPLANT
TOWEL GREEN STERILE FF (TOWEL DISPOSABLE) ×3 IMPLANT
TRAY LAPAROSCOPIC MC (CUSTOM PROCEDURE TRAY) ×3 IMPLANT
TROCAR XCEL BLUNT TIP 100MML (ENDOMECHANICALS) ×3 IMPLANT
TROCAR XCEL NON-BLD 5MMX100MML (ENDOMECHANICALS) ×3 IMPLANT
WATER STERILE IRR 1000ML POUR (IV SOLUTION) ×3 IMPLANT

## 2020-12-08 NOTE — Anesthesia Procedure Notes (Addendum)
  Anesthesia Regional Block: TAP block   Pre-Anesthetic Checklist: , timeout performed,  Correct Patient, Correct Site, Correct Laterality,  Correct Procedure, Correct Position, site marked,  Risks and benefits discussed,  Surgical consent,  Pre-op evaluation,  At surgeon's request and post-op pain management  Laterality: Right  Prep: chloraprep       Needles:  Injection technique: Single-shot  Needle Type: Echogenic Needle     Needle Length: 9cm  Needle Gauge: 21     Additional Needles:   Narrative:  Start time: 12/08/2020 12:20 PM End time: 12/08/2020 12:32 PM Injection made incrementally with aspirations every 5 mL.  Performed by: Personally  Anesthesiologist: Achille Rich, MD  Additional Notes: Pt tolerated the procedure well.

## 2020-12-08 NOTE — Transfer of Care (Signed)
Immediate Anesthesia Transfer of Care Note  Patient: Webber Michiels  Procedure(s) Performed: LAPAROSCOPIC CHOLECYSTECTOMY (Abdomen)  Patient Location: PACU  Anesthesia Type:General and Regional  Level of Consciousness: patient cooperative and responds to stimulation  Airway & Oxygen Therapy: Patient Spontanous Breathing and Patient connected to nasal cannula oxygen  Post-op Assessment: Report given to RN and Post -op Vital signs reviewed and stable  Post vital signs: Reviewed and stable  Last Vitals:  Vitals Value Taken Time  BP 116/60 12/08/20 1403  Temp    Pulse 95 12/08/20 1404  Resp 33 12/08/20 1404  SpO2 99 % 12/08/20 1404  Vitals shown include unvalidated device data.  Last Pain:  Vitals:   12/08/20 1215  TempSrc:   PainSc: 0-No pain         Complications: No notable events documented.

## 2020-12-08 NOTE — Anesthesia Procedure Notes (Signed)
Procedure Name: Intubation Date/Time: 12/08/2020 12:49 PM Performed by: Betha Loa, CRNA Pre-anesthesia Checklist: Patient identified, Emergency Drugs available, Suction available and Patient being monitored Patient Re-evaluated:Patient Re-evaluated prior to induction Oxygen Delivery Method: Circle System Utilized Preoxygenation: Pre-oxygenation with 100% oxygen Induction Type: IV induction Ventilation: Mask ventilation without difficulty Laryngoscope Size: Mac and 4 Grade View: Grade II Tube type: Oral Tube size: 7.5 mm Number of attempts: 1 Airway Equipment and Method: Stylet and Oral airway Placement Confirmation: ETT inserted through vocal cords under direct vision, positive ETCO2 and breath sounds checked- equal and bilateral Secured at: 22 cm Tube secured with: Tape Dental Injury: Teeth and Oropharynx as per pre-operative assessment

## 2020-12-08 NOTE — Discharge Instructions (Signed)
CIRUGIA LAPAROSCOPICA: INSTRUCCIONES DE POST OPERATORIO.  Revise siempre los documentos que le entreguen en el lugar donde se ha hecho la cirugia.  SI USTED NECESITA DOCUMENTOS DE INCAPACIDAD (DISABLE) O DE PERMISO FAMILAR (FAMILY LEAVE) NECESITA TRAERLOS A LA OFICINA PARA QUE SEAN PROCESADOS. NO  SE LOS DE A SU DOCTOR. A su alta del hospital se le dara una receta para controlar el dolor. Tomela como ha sido recetada, si la necesita. Si no la necesita puede tomar, Acetaminofen (Tylenol) o Ibuprofen (Advil) para aliviar dolor moderado. Continue tomando el resto de sus medicinas. Si necesita rellenar la receta, llame a la farmacia. ellos contactan a nuestra oficina pidiendo autorizacion. Este tipo de receta no pueden ser rellenadas despues de las  5pm o durante los fines de semana. Con relacion a la dieta: debe ser ligera los primeros dias despues que llege a la casa. Ejemplo: sopas y galleticas. Tome bastante liquido esos dias. La mayoria de los pacientes padecen de inflamacion y cambio de coloracion de la piel alrededor de las incisiones. esto toma dias en resolver.  pnerse una bolsa de hielo en el area affectada ayuda..  Es comun tambien tener un poco de estrenimiento si esta tomado medicinas para el dolor. incremente la cantidad de liquidos a tomar y puede tomar (Colace) esto previene el problema. Si ya tiene estrenimiento, es decir no ha defecado en 48 horas, puede tomar un laxativo (Milk of Magnesia or Miralax) uselo como el paquete le explica.  A menos que se le diga algo diferente. Remueva el bendaje a las 24-48 horas despues dela cirugia. y puede banarse en la ducha sin ningun problema. usted puede tener steri-strips (pequenas curitas transparentes en la piel puesta encima de la incision)  Estas banditas strips should be left on the skin for 7-10 days.   Si su cirujano puso pegamento encima de la incision usted puede banarse bajo la ducha en 24 horas. Este pegamento empezara a caerse en las  proximas 2-3 semanas. Si le pusieron suturas o presillas (grapos) estos seran quitados en su proxima cita en la oficina. . ACTIVIDADES:  Puede hacer actividad ligera.  Como caminar , subir escaleras y poco a poco irlas incrementando tanto como las tolere. Puede tener relaciones sexuales cuando sea comfortable. No carge objetos pesados o haga esfuerzos que no sean aprovados por su doctor. Puede manejar en cuanto no esta tomando medicamentos fuertes (narcoticos) para el dolor, pueda abrochar confortablemente el cinturon de seguridad, y pueda maniobrar y usar los pedales de su vehiculo con seguridad. PUEDE REGRESAR A TRABAJAR  Debe ver a su doctor para una cita de seguimiento en 2-3 semanas despues de la cirugia.  OTRAS ISNSTRUCCIONES:___________________________________________________________________________________ CUANDO LLAMAR A SU MEDICO: FIEBRE mayor de  101.0 No produccion de orina. Sangramiento continue de la herida Incremento de dolor, enrojecimientio o drenaje de la herida (incision) Incremento de dolor abdominal.  The clinic staff is available to answer your questions during regular business hours.  Please don't hesitate to call and ask to speak to one of the nurses for clinical concerns.  If you have a medical emergency, go to the nearest emergency room or call 911.  A surgeon from [facility] is always on call at the hospital. [address], [address], [place], [place]  [address] ? [address] [address], [place], [place]   [address] [phone] ? [phone] ? FAX [phone] Web site: [email or web address]  

## 2020-12-08 NOTE — Progress Notes (Signed)
Patient arrived from PACU. VSS. Spaniah speaking but can communicate with staff for basic needs. Drsg intact. Tolerating fluids. Mild pain. Ice applied to Abd. Patient DTV.

## 2020-12-08 NOTE — H&P (Signed)
David Knapp 02-06-1980  [id number].    Chief Complaint/Reason for Consult: biliary colic  HPI:  This is a 40 yo Spanish speaking male with a history of DM, HIV (on meds), and hypothyroidism who began having RUQ abdominal pain yesterday morning.  He has no other associated symptoms such as N/V/D, fevers, chest pain, SOB, etc.  His pain worsened throughout the day and he presented to the ED for evaluation.  He was given pain medication which has helped his pain some.  His WBC and LFTs are normal, but he has an [place] that revealed cholelithiasis with a stone lodged in the neck of the gallbladder.  His CBG is 161 and cr around 1.2 which is close to his baseline. We have been asked to see him for further evaluation.  ROS: ROS: Please see HPI, otherwise all other systems have been reviewed and are negative.  He doesn't check his CBGs at home.  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 He has never used smokeless tobacco. He reports that he does not drink alcohol and does not use drugs.  Allergies:  Allergies  Allergen Reactions   Sulfa Antibiotics Other (See Comments)     He believes he has experienced throat swelling with prezcobix and Descovy for nearly a year since April 2017 to present March 22, 2016    (Not in a hospital admission)    Physical Exam: Blood pressure 115/82, pulse 86, temperature 99.5 F (37.5 C), temperature source Oral, resp. rate 20, height 5\' 5"  (1.651 m), weight 98 kg, SpO2 93 %. General: pleasant, WD, WN Hispanic male who is laying in bed in NAD HEENT: head is normocephalic, atraumatic.  Sclera are noninjected.  PERRL.  Ears and nose without any masses or lesions.  Mouth is pink and moist Heart: regular, rate, and rhythm.  Normal s1,s2. No obvious murmurs, gallops, or rubs noted.  Palpable radial and pedal pulses bilaterally Lungs: CTAB, no wheezes, rhonchi, or rales noted.  Respiratory effort nonlabored Abd: soft, mildly tender in RUQ, ND, +BS, no masses, hernias, or organomegaly MS: all 4 extremities are symmetrical with no cyanosis, clubbing, or edema. Skin: warm and dry with no masses, lesions, or rashes Neuro: Cranial nerves 2-12 grossly intact, sensation is normal throughout Psych: A&Ox3 with an appropriate affect.   Results for orders placed or performed during the hospital encounter of 12/07/20 (from the past 48 hour(s))  Urinalysis, Routine w reflex microscopic Urine, Clean Catch     Status: Abnormal   Collection Time: 12/07/20  9:42 PM  Result Value Ref Range   Color,  Urine YELLOW YELLOW   APPearance CLEAR CLEAR   Specific Gravity, Urine 1.020 1.005 - 1.030   pH 7.0 5.0 - 8.0   Glucose, UA 100 (A) NEGATIVE mg/dL   Hgb urine dipstick NEGATIVE NEGATIVE   Bilirubin Urine NEGATIVE NEGATIVE   Ketones, ur NEGATIVE NEGATIVE mg/dL   Protein, ur NEGATIVE NEGATIVE mg/dL   Nitrite NEGATIVE NEGATIVE   Leukocytes,Ua NEGATIVE NEGATIVE    Comment: Microscopic not done on urines with negative protein, blood, leukocytes, nitrite, or glucose < 500 mg/dL. Performed at [facility], [address], [place], [place]  [address]   CBC with Differential     Status: None   Collection Time: 12/07/20  9:46 PM  Result Value Ref Range   WBC 7.0 4.0 - 10.5 K/uL   RBC 4.97 4.22 - 5.81 MIL/uL   Hemoglobin 15.5 13.0 - 17.0 g/dL   HCT 60.1 09.3 - 23.5 %   MCV 90.5 80.0 - 100.0 fL   MCH 31.2 26.0 - 34.0 pg   MCHC 34.4 30.0 - 36.0 g/dL   RDW 57.3 22.0 - 25.4 %   Platelets 212 150 - 400 K/uL   nRBC 0.0 0.0 - 0.2 %   Neutrophils Relative % 66 %   Neutro Abs 4.7 1.7 - 7.7 K/uL   Lymphocytes Relative 24 %   Lymphs Abs 1.7 0.7 - 4.0 K/uL   Monocytes Relative 7 %   Monocytes Absolute 0.5 0.1 - 1.0 K/uL   Eosinophils Relative 2 %   Eosinophils Absolute 0.1 0.0 - 0.5 K/uL   Basophils Relative 0 %   Basophils Absolute 0.0 0.0 - 0.1 K/uL   Immature Granulocytes 1 %   Abs Immature Granulocytes 0.04 0.00 - 0.07 K/uL    Comment: Performed at [facility], [address] [place], [place] [address]  Comprehensive metabolic panel     Status: Abnormal   Collection Time: 12/07/20  9:46 PM  Result Value Ref Range   Sodium 134 (L) 135 - 145 mmol/L   Potassium 3.4 (L) 3.5 - 5.1 mmol/L   Chloride 100 98 - 111 mmol/L   CO2 25 22 - 32 mmol/L   Glucose, Bld 161 (H) 70 - 99 mg/dL    Comment: Glucose reference range applies only to samples taken after fasting for at least 8 hours.   BUN 13 6 - 20 mg/dL   Creatinine, Ser 3.76 0.61 - 1.24 mg/dL   Calcium 9.2 8.9 - 28.3 mg/dL   Total Protein 7.4 6.5 - 8.1 g/dL   Albumin 4.2 3.5 - 5.0 g/dL   AST 91 (H) 15 - 41 U/L   ALT 72 (H) 0 - 44 U/L   Alkaline Phosphatase 74 38 - 126 U/L   Total Bilirubin 1.3 (H) 0.3 - 1.2 mg/dL   GFR, Estimated >15 >17 mL/min    Comment: (NOTE) Calculated using the CKD-EPI Creatinine Equation (2021)    Anion gap 9 5 - 15    Comment: Performed at [facility], [address], [place], [place] [address]  Lipase, blood     Status: None   Collection Time: 12/07/20  9:46 PM  Result Value Ref Range   Lipase 26 11 - 51 U/L    Comment: Performed  at [facility], [address], [place], [place] [address]   US Abdomen Limited RUQ (LIVER/GB)  Result Date: 12/07/2020 CLINICAL DATA:  Right upper quadrant abdominal pain. EXAM: ULTRASOUND ABDOMEN LIMITED RIGHT UPPER QUADRANT  COMPARISON:  None. FINDINGS: Gallbladder: There multiple stones as well as sludge within the gallbladder. There is a stone in the neck of the gallbladder. Minimally thickened gallbladder wall measuring 4-5 mm. Positive sonographic Murphy's sign reported. Common bile duct: Diameter: 3 mm Liver: There is diffuse increased liver echogenicity most commonly seen in the setting of fatty infiltration. Superimposed inflammation or fibrosis is not excluded. Clinical correlation is recommended. Portal vein is patent on color Doppler imaging with normal direction of blood flow towards the liver. Other: None. IMPRESSION: 1. Cholelithiasis with possible acute cholecystitis. A hepatobiliary scintigraphy may provide better evaluation of the gallbladder if there is a high clinical concern for acute cholecystitis . 2. Fatty liver. Electronically Signed   By: Elgie Collard M.D.   On: 12/07/2020 22:20      Assessment/Plan Biliary colic, early cholecystitis The patient is noted to have gallstones and a stone lodged in the neck of the gallbladder.  We will plan to admit the patient to observation with OR today vs tomorrow pending schedule availability.  I have explained the procedure, risks, and aftercare of cholecystectomy.  Risks include but are not limited to bleeding, infection, wound problems, anesthesia, diarrhea, bile leak, injury to common bile duct/liver/intestine.  He seems to understand and agrees to proceed.  This was done with an interpreter.    FEN - NPO, IVFs VTE - lovenox ID - [email or web address] Admit - obs  HIV - resume home meds DM - SSI, hold metformin while NPO Hypothyroidism - resume home meds  Letha Cape, [facility] 12/08/2020, 8:13 AM Please see  Amion for pager number during day hours 7:00am-4:30pm or 7:00am -11:30am on weekends

## 2020-12-08 NOTE — Anesthesia Preprocedure Evaluation (Signed)
Anesthesia Evaluation  Patient identified by MRN, date of birth, ID band Patient awake    Reviewed: Allergy & Precautions, H&P , NPO status , Patient's Chart, lab work & pertinent test results  Airway Mallampati: II   Neck ROM: full    Dental   Pulmonary neg pulmonary ROS,    breath sounds clear to auscultation       Cardiovascular negative cardio ROS   Rhythm:regular Rate:Normal  TTE (04/2017): EF 60%. Normal LV function.   Neuro/Psych    GI/Hepatic   Endo/Other  diabetes, Type 2Hypothyroidism   Renal/GU      Musculoskeletal   Abdominal   Peds  Hematology  (+) HIV,   Anesthesia Other Findings   Reproductive/Obstetrics                             Anesthesia Physical Anesthesia Plan  ASA: 2  Anesthesia Plan: General   Post-op Pain Management: Regional block   Induction: Intravenous  PONV Risk Score and Plan: 2 and Ondansetron, Dexamethasone, Midazolam and Treatment may vary due to age or medical condition  Airway Management Planned: Oral ETT  Additional Equipment:   Intra-op Plan:   Post-operative Plan: Extubation in OR  Informed Consent: I have reviewed the patients History and Physical, chart, labs and discussed the procedure including the risks, benefits and alternatives for the proposed anesthesia with the patient or authorized representative who has indicated his/her understanding and acceptance.     Dental advisory given  Plan Discussed with: CRNA, Anesthesiologist and Surgeon  Anesthesia Plan Comments:         Anesthesia Quick Evaluation

## 2020-12-08 NOTE — ED Provider Notes (Signed)
[facility] EMERGENCY DEPARTMENT Provider Note   CSN: [id number] Arrival date & time: 12/07/20  2059     History Chief Complaint  Patient presents with   Abdominal Pain    David Knapp is a 40 y.o. male.  Patient with history of HIV, most recent CD4 count 08/2020 in the 300s, diabetes, chronic kidney disease, hypothyroidism: Presents to the emergency department for evaluation of right upper quadrant pain.  Symptoms started after eating 2 days ago.  Patient reports pain in the right upper quadrant and "pit of my stomach" that does not radiate.  No associated vomiting or diarrhea.  No urinary changes.  No chest pain or shortness of breath.  No reported fevers.  He has never had pain like this in the past.  Because of this, he has not taken any treatments for it.  Pain has been constant since onset and currently describes it as a "soreness". The onset of this condition was acute. The course is constant. Aggravating factors: none. Alleviating factors: none.        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   Throat swelling 03/22/2016   Type 2 diabetes mellitus with hyperglycemia ([place]) 06/04/2019    Patient Active Problem List   Diagnosis Date Noted   Type 2 diabetes mellitus with hyperglycemia ([place]) 06/04/2019   Hyperglycemia 06/03/2019   Peripheral edema 04/24/2017   Hypothyroidism 04/24/2017   Hyponatremia 04/24/2017   CKD (chronic kidney disease), stage III ([place]) 04/24/2017    CHF (congestive heart failure) ([place]) 04/23/2017   Allergic reaction 03/22/2016   Throat swelling 03/22/2016   Change in voice 03/22/2016   Acute kidney injury ([place])    Strep throat 04/28/2015   Generalized edema    CKD (chronic kidney disease), stage II    Strep pharyngitis    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   Acute renal failure ([place]) 07/15/2014   Orthostatic hypotension 05/20/2012   Fever 05/15/2012   HIV disease ([place]) 05/15/2012   SIRS (systemic inflammatory response syndrome) ([place]) 05/15/2012   Viral warts 07/26/2007   PSORIASIS 02/13/2007   SKIN RASH 01/25/2007   Pancytopenia ([place]) 08/01/2006   SEPTIC SHOCK 08/01/2006   PNEUMONIA, HX OF 07/31/2006   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 Authorizing Provider  bictegravir-emtricitabine-tenofovir AF (BIKTARVY) 50-200-25 MG  TABS tablet Take 1 tablet by mouth daily. 08/25/20   Randall Hiss, MD  levothyroxine (SYNTHROID) 200 MCG tablet Take 1 tablet (200 mcg total) by mouth daily before breakfast. 08/25/20   Daiva Eves, Lisette Grinder, MD  levothyroxine (SYNTHROID) 50 MCG tablet Take 1 tablet (50 mcg total) by mouth daily before breakfast. Combine with 200 mcg for total  of 250 mcg 08/26/20   Daiva Eves, Lisette Grinder, MD  metFORMIN (GLUCOPHAGE) 1000 MG tablet Take 1 tablet (1,000 mg total) by mouth 2 (two) times daily with a meal. 08/25/20   Daiva Eves,  Lisette Grinder, MD  rosuvastatin (CRESTOR) 20 MG tablet Take 1 tablet (20 mg total) by mouth daily. 08/25/20   Randall Hiss, MD    Allergies    Sulfa antibiotics  Review of Systems   Review of Systems  Constitutional:  Negative for fever.  HENT:  Negative for rhinorrhea and sore throat.   Eyes:  Negative for redness.  Respiratory:  Negative for cough.   Cardiovascular:  Negative for chest pain.  Gastrointestinal:  Positive for abdominal pain. Negative for diarrhea, nausea and vomiting.  Genitourinary:  Negative for dysuria and hematuria.  Musculoskeletal:  Negative for back pain and myalgias.  Skin:  Negative for rash.  Neurological:  Negative for headaches.   Physical Exam Updated Vital Signs BP (!) 122/91 (BP Location: Left Arm)   Pulse 84   Temp 99.7 F (37.6 C) (Oral)   Resp 17   Ht 5\' 5"  (1.651 m)   Wt 98 kg   SpO2 97%   BMI 35.95 kg/m   Physical Exam Vitals and nursing note reviewed.  Constitutional:      General: He is not in acute distress.    Appearance: He is well-developed.  HENT:     Head: Normocephalic and atraumatic.  Eyes:     General:        Right eye: No discharge.        Left eye: No discharge.     Conjunctiva/sclera: Conjunctivae normal.  Cardiovascular:     Rate and Rhythm: Normal rate and regular rhythm.     Heart sounds: Normal heart sounds.  Pulmonary:     Effort: Pulmonary effort is normal.     Breath sounds: Normal breath sounds.  Abdominal:     Palpations: Abdomen is soft.     Tenderness: There is abdominal tenderness in the right upper quadrant and epigastric area. There is guarding. There is no rebound. Positive signs include Murphy's sign. Negative signs include McBurney's sign.  Musculoskeletal:     Cervical back: Normal range of motion and neck supple.  Skin:    General: Skin is warm and dry.  Neurological:     Mental Status: He is alert.    ED Results / Procedures / Treatments   Labs (all labs ordered are listed, but  only abnormal results are displayed) Labs Reviewed  COMPREHENSIVE METABOLIC PANEL - Abnormal; Notable for the following components:      Result Value   Sodium 134 (*)    Potassium 3.4 (*)    Glucose, Bld 161 (*)    AST 91 (*)    ALT 72 (*)    Total Bilirubin 1.3 (*)    All other components within normal limits  URINALYSIS, ROUTINE W REFLEX MICROSCOPIC - Abnormal; Notable for the following components:   Glucose, UA 100 (*)    All other components within normal limits  RESP PANEL BY RT-PCR (FLU  A&B, COVID) ARPGX2  CBC WITH DIFFERENTIAL/PLATELET  LIPASE, BLOOD  CBC    EKG None  Radiology US Abdomen Limited RUQ (LIVER/GB)  Result Date: 12/07/2020 CLINICAL DATA:  Right upper quadrant abdominal pain. EXAM: ULTRASOUND ABDOMEN LIMITED RIGHT UPPER QUADRANT COMPARISON:  None. FINDINGS: Gallbladder: There multiple stones as well as sludge within the gallbladder. There is a stone in the neck of the gallbladder. Minimally thickened gallbladder wall measuring 4-5 mm. Positive sonographic Murphy's sign reported. Common bile duct: Diameter: 3 mm Liver: There is diffuse increased liver echogenicity most commonly seen in the setting of fatty infiltration. Superimposed inflammation or fibrosis is not excluded. Clinical correlation is recommended. Portal vein is patent on color Doppler imaging with normal direction of blood flow towards the liver. Other: None. IMPRESSION: 1. Cholelithiasis with possible acute cholecystitis. A hepatobiliary scintigraphy may provide better evaluation of the gallbladder if there is a high clinical concern for acute cholecystitis . 2. Fatty liver. Electronically Signed   By: Anner Crete M.D.   On: 12/07/2020 22:20    Procedures Procedures   Medications Ordered in ED Medications  sodium chloride 0.9 % bolus 1,000 mL (has no administration in time range)  fentaNYL (SUBLIMAZE) injection 25 mcg (25 mcg Intramuscular Given 12/07/20 2214)    ED Course  I have reviewed  the triage vital signs and the nursing notes.  Pertinent labs & imaging results that were available during my care of the patient were reviewed by me and considered in my medical decision making (see chart for details).  Patient seen and examined. Plan discussed with patient. Will request consult from general surgery.    Labs: Normal white count 12 hours ago, AST/ALT mildly elevated, glucose 161  Imaging: Ultrasound with gallstones, stone in gallbladder neck, probable early cholecystitis.  Medications/Fluids: IV fluid bolus, patient declines pain medication at this time  Vital signs reviewed and are as follows: BP (!) 122/91 (BP Location: Left Arm)   Pulse 84   Temp 99.7 F (37.6 C) (Oral)   Resp 17   Ht 5\' 5"  (1.651 m)   Wt 98 kg   SpO2 97%   BMI 35.95 kg/m   7:39 AM Spoke with Claiborne Billings PA-C general surgery who will see. Likely admit for cholecystectomy.     MDM Rules/Calculators/A&P                           Awaiting surgery consult.   Final Clinical Impression(s) / ED Diagnoses Final diagnoses:  RUQ pain  Calculus of gallbladder with acute cholecystitis without obstruction    Rx / DC Orders ED Discharge Orders     None        Carlisle Cater, PA-C 12/08/20 0740    Godfrey Pick, MD 12/08/20 1659

## 2020-12-08 NOTE — Op Note (Signed)
Preoperative diagnosis: Acute cholecystitis Postoperative diagnosis: Gangrenous cholecystitis Procedure: Laparoscopic cholecystectomy with ICG dye Surgeon: Dr. Harden Mo Asst: Trixie Deis, PA-C Anesthesia: General Estimated blood loss: minimal Complications: None Drains: None Specimens: Gallbladder and contents to pathology Sponge needle count was correct at completion Disposition to recovery stable condition  Indications: 40 yom history of HIV and DM with gallstones, ruq tender and [place] consistent with acute cholecystitis.  We discussed lap chole with use of AMN interpreter.   Procedure: After informed consent was obtained he was taken to the OR.   He was given antibiotics.  He underwent a TAP block. SCDs were in place.  He was then placed under general anesthesia without complication.  He was prepped and draped in the standard sterile surgical fashion.  A surgical timeout was then performed.  I infiltrated Marcaine below the umbilicus.  I made a vertical incision.  I grasped the fascia incised that sharply.  I then entered the peritoneum bluntly.  I placed a pursestring suture of 0 Vicryl.  I then placed a Hassan trocar and insufflated the abdomen to 15 mmHg pressure.  I then placed 3 additional 5 mm trocars in epigastrium and right side of the abdomen under direct vision without complication.  I removed the omentum from the liver and gallbladder.  The gallbladder was noted to have gangrenous cholecystitis. I had to aspirate it just to be able to grasp it as it was very tense.   I retracted the gallbladder cephalad and lateral.  I did have him injected with ICG dye and used this to help identify the critical view of safety.  I was able to obtain the critical view of safety and lift the gallbladder off the liver.  I then clipped the cystic duct 3 times.  I divided it leaving 2 clips in place.  The cystic duct was viable and the clips completely traversed the duct.  I treated the artery in a  similar fashion. I got into the artery a couple more times very high on the gallbladder and placed additional clips. This accounts for extra clips that were well out of the triangle.  I did enter into the gallbladder where it was fused with the liver.  I spilled some small stones and attempted to evacuate all of these. The gallbladder was then removed from the liver bed.  I then placed the gallbladder in retrieval bag. I removed all of this from the umbilical trocar site.  Hemostasis was obtained.  I removed the Hasson trocar and closed with 2 2-0 vicryl sutures using the suture passer device. I then desufflated the abdomen and removed the remaining trocars. Incisoins were closed with 4-0 Monocryl and glue.  He tolerated this well was extubated transferred to recovery stable.

## 2020-12-09 ENCOUNTER — Encounter (HOSPITAL_COMMUNITY): Payer: Self-pay | Admitting: General Surgery

## 2020-12-09 ENCOUNTER — Other Ambulatory Visit (HOSPITAL_COMMUNITY): Payer: Self-pay

## 2020-12-09 LAB — COMPREHENSIVE METABOLIC PANEL
ALT: 75 U/L — ABNORMAL HIGH (ref 0–44)
AST: 79 U/L — ABNORMAL HIGH (ref 15–41)
Albumin: 3.3 g/dL — ABNORMAL LOW (ref 3.5–5.0)
Alkaline Phosphatase: 67 U/L (ref 38–126)
Anion gap: 7 (ref 5–15)
BUN: 11 mg/dL (ref 6–20)
CO2: 25 mmol/L (ref 22–32)
Calcium: 8.5 mg/dL — ABNORMAL LOW (ref 8.9–10.3)
Chloride: 100 mmol/L (ref 98–111)
Creatinine, Ser: 1.17 mg/dL (ref 0.61–1.24)
GFR, Estimated: >60 mL/min (ref >60)
Glucose, Bld: 129 mg/dL — ABNORMAL HIGH (ref 70–99)
Potassium: 4.1 mmol/L (ref 3.5–5.1)
Sodium: 132 mmol/L — ABNORMAL LOW (ref 135–145)
Total Bilirubin: 1.5 mg/dL — ABNORMAL HIGH (ref 0.3–1.2)
Total Protein: 6.6 g/dL (ref 6.5–8.1)

## 2020-12-09 LAB — GLUCOSE, CAPILLARY
Glucose-Capillary: 121 mg/dL — ABNORMAL HIGH (ref 70–99)
Glucose-Capillary: 112 mg/dL — ABNORMAL HIGH (ref 70–99)

## 2020-12-09 LAB — SURGICAL PATHOLOGY

## 2020-12-09 MED ORDER — ACETAMINOPHEN 500 MG PO TABS: 1000.0000 mg | ORAL_TABLET | Freq: Four times a day (QID) | ORAL | 0 refills | Status: AC | PRN

## 2020-12-09 MED ORDER — TRAMADOL HCL 50 MG PO TABS
50.0000 mg | ORAL_TABLET | Freq: Four times a day (QID) | ORAL | 0 refills | Status: DC | PRN
  Filled 2020-12-09: qty 15, 4d supply, fill #0

## 2020-12-09 NOTE — Anesthesia Postprocedure Evaluation (Signed)
Anesthesia Post Note  Patient: David Knapp  Procedure(s) Performed: LAPAROSCOPIC CHOLECYSTECTOMY (Abdomen)     Patient location during evaluation: PACU Anesthesia Type: General Level of consciousness: awake and alert Pain management: pain level controlled Vital Signs Assessment: post-procedure vital signs reviewed and stable Respiratory status: spontaneous breathing, nonlabored ventilation, respiratory function stable and patient connected to nasal cannula oxygen Cardiovascular status: blood pressure returned to baseline and stable Postop Assessment: no apparent nausea or vomiting Anesthetic complications: no   No notable events documented.  Last Vitals:  Vitals:   12/08/20 1953 12/09/20 0459  BP: 101/63 100/63  Pulse: 69 (!) 59  Resp: 19 18  Temp: 37.1 C 36.8 C  SpO2: 100% 96%    Last Pain:  Vitals:   12/09/20 0459  TempSrc: Oral  PainSc:                  Pablo Mathurin S

## 2020-12-09 NOTE — Discharge Summary (Signed)
Patient ID: David Knapp [id number] 1980-03-27 40 y.o.  Admit date: 12/07/2020 Discharge date: 12/09/2020  Admitting Diagnosis: Acute cholecystitis  Discharge Diagnosis Patient Active Problem List   Diagnosis Date Noted   Biliary colic 12/08/2020   Type 2 diabetes mellitus with hyperglycemia (HCC) 06/04/2019   Hyperglycemia 06/03/2019   Peripheral edema 04/24/2017   Hypothyroidism 04/24/2017   Hyponatremia 04/24/2017   CKD (chronic kidney disease), stage III (HCC) 04/24/2017   CHF (congestive heart failure) (HCC) 04/23/2017   Allergic reaction 03/22/2016   Throat swelling 03/22/2016   Change in voice 03/22/2016   Acute kidney injury ([facility])    Strep throat 04/28/2015   Generalized edema    CKD (chronic kidney disease), stage II    Strep pharyngitis    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   Acute renal failure ([facility]) 07/15/2014   Orthostatic hypotension 05/20/2012   Fever 05/15/2012   HIV disease ([facility]) 05/15/2012   SIRS (systemic inflammatory response syndrome) ([facility]) 05/15/2012   Viral warts 07/26/2007   PSORIASIS 02/13/2007   SKIN RASH 01/25/2007   Pancytopenia (HCC) 08/01/2006   SEPTIC SHOCK 08/01/2006   PNEUMONIA, HX OF 07/31/2006   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  He has no other associated symptoms such as N/V/D, fevers, chest pain, SOB, etc.  His pain worsened throughout the day and he presented to the ED for evaluation.  He was given pain medication which has helped his pain some.  His WBC and LFTs are normal, but he has an [place] that revealed cholelithiasis with a stone lodged in the neck of the gallbladder.  His CBG is 161 and cr around 1.2 which is close to his baseline. We have been asked to see him for further  evaluation.  Procedures Lap chole, with ICG dye, Dr. Dwain Sarna 12/08/20  Hospital Course:  The patient was admitted and underwent a laparoscopic cholecystectomy.  The patient tolerated the procedure well.  On POD 1, the patient was tolerating a regular diet, voiding well, mobilizing, and pain was controlled with oral pain medications.  The patient was stable for DC home at this time with appropriate follow up made.  His other medical problems remained stable during his admission.   Physical Exam: Abd: soft, minimally tender, incisions c/d/I, ND  Allergies as of 12/09/2020       Reactions   Sulfa Antibiotics Other (See Comments)   He believes he has experienced throat swelling with prezcobix and Descovy for nearly a year since April 2017 to present March 22, 2016        Medication List     TAKE these medications    acetaminophen 500 MG tablet Commonly known as: TYLENOL Take 2 tablets (1,000 mg total) by mouth every 6 (six) hours as needed.   Biktarvy 50-200-25 MG Tabs tablet Generic drug: bictegravir-emtricitabine-tenofovir AF Take 1 tablet by mouth daily.   levothyroxine 200 MCG tablet Commonly known as: SYNTHROID Take 1 tablet (200 mcg total) by mouth daily before breakfast. What changed: additional instructions   levothyroxine 50 MCG tablet Commonly known as: Synthroid Take 1 tablet (50 mcg total) by mouth daily before breakfast. Combine with 200 mcg for total  of 250 mcg What changed: Another medication with the same name was changed. Make sure you understand how and when to take each.  metFORMIN 1000 MG tablet Commonly known as: GLUCOPHAGE Take 1 tablet (1,000 mg total) by mouth 2 (two) times daily with a meal.   rosuvastatin 20 MG tablet Commonly known as: CRESTOR Take 1 tablet (20 mg total) by mouth daily.   traMADol 50 MG tablet Commonly known as: ULTRAM Take 1 tablet (50 mg total) by mouth every 6 (six) hours as needed (mild pain).           Follow-up Information     [facility], [facility] Follow up in 3 week(s).   Specialty: General Surgery Why: 2pm  [place] 30 minutos antes de la hora de su cita para el papeleo y Surveyor, minerals de Engineer, maintenance (IT). Contact information: [address] [place] [address] [phone]         Daiva Eves, Lisette Grinder, MD Follow up.   Specialty: Infectious Diseases Contact information: [address] [place] [address] [phone]                 Signed: Barnetta Chapel, [facility] 12/09/2020, 9:55 AM Please see Amion for pager number during day hours 7:00am-4:30pm, 7-11:30am on Weekends

## 2021-01-26 ENCOUNTER — Other Ambulatory Visit: Payer: Self-pay

## 2021-01-26 ENCOUNTER — Ambulatory Visit: Payer: Self-pay

## 2021-03-03 ENCOUNTER — Encounter: Payer: Self-pay | Admitting: Infectious Disease

## 2021-03-03 ENCOUNTER — Ambulatory Visit: Payer: Self-pay | Admitting: Infectious Disease

## 2021-03-03 ENCOUNTER — Ambulatory Visit (INDEPENDENT_AMBULATORY_CARE_PROVIDER_SITE_OTHER): Payer: Self-pay

## 2021-03-03 ENCOUNTER — Other Ambulatory Visit: Payer: Self-pay

## 2021-03-03 VITALS — BP 112/74 | HR 70 | Temp 98.2°F | Wt 237.0 lb

## 2021-03-03 DIAGNOSIS — N182 Chronic kidney disease, stage 2 (mild): Secondary | ICD-10-CM

## 2021-03-03 DIAGNOSIS — E039 Hypothyroidism, unspecified: Secondary | ICD-10-CM

## 2021-03-03 DIAGNOSIS — K810 Acute cholecystitis: Secondary | ICD-10-CM

## 2021-03-03 DIAGNOSIS — B399 Histoplasmosis, unspecified: Secondary | ICD-10-CM

## 2021-03-03 DIAGNOSIS — B20 Human immunodeficiency virus [HIV] disease: Secondary | ICD-10-CM

## 2021-03-03 DIAGNOSIS — Z23 Encounter for immunization: Secondary | ICD-10-CM

## 2021-03-03 DIAGNOSIS — E1165 Type 2 diabetes mellitus with hyperglycemia: Secondary | ICD-10-CM

## 2021-03-03 DIAGNOSIS — K8050 Calculus of bile duct without cholangitis or cholecystitis without obstruction: Secondary | ICD-10-CM

## 2021-03-03 HISTORY — DX: Acute cholecystitis: K81.0

## 2021-03-03 MED ORDER — LEVOTHYROXINE SODIUM 200 MCG PO TABS: 200.0000 ug | ORAL_TABLET | Freq: Every day | ORAL | 11 refills | Status: DC

## 2021-03-03 MED ORDER — METFORMIN HCL 1000 MG PO TABS: 1000.0000 mg | ORAL_TABLET | Freq: Two times a day (BID) | ORAL | 11 refills | Status: DC

## 2021-03-03 MED ORDER — LEVOTHYROXINE SODIUM 50 MCG PO TABS: 50.0000 ug | ORAL_TABLET | Freq: Every day | ORAL | 11 refills | Status: DC

## 2021-03-03 MED ORDER — ROSUVASTATIN CALCIUM 20 MG PO TABS: 20.0000 mg | ORAL_TABLET | Freq: Every day | ORAL | 11 refills | Status: DC

## 2021-03-03 MED ORDER — BIKTARVY 50-200-25 MG PO TABS: 1.0000 | ORAL_TABLET | Freq: Every day | ORAL | 11 refills | Status: DC

## 2021-03-03 NOTE — Progress Notes (Signed)
Patient reports to having one covid vaccine. Per Dr. Daiva Eves okay to give patient Bivalent booster. ?Juanita Laster, RMA ? ?

## 2021-03-03 NOTE — Progress Notes (Signed)
HIV Positive/AIDS   Chief complaint : followup for HIV disease on medications.  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   He had been previously treated with PREZCOBIX and DESCOVY with nice virological suppression recently changed to [facility] with continued suppression.   Does have comorbid obesity diabetes mellitus and hypothyroidism.  He is taking metformin at 1000 mg twice daily.  He is taking Synthroid 200 grams daily though this latter medication is not covered with HIV medication assistance program.  I did try to escalate his Synthroid to 250 mcg but this appears have not happened.  He desperately needs to be engaged with primary care and/or endocrinology.  He was recently admitted to the hospital for gangrenous cholecystitis and underwent cholecystectomy.  He also was diagnosed with influenza.  He is recovered from the surgery and says that he was given a number of clinic which he had in his old cell phone but now he is lost that cell phone.    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   Throat swelling 03/22/2016   Type 2 diabetes mellitus with hyperglycemia (HCC) 06/04/2019    Past Surgical History:  Procedure Laterality Date   CHOLECYSTECTOMY N/A 12/08/2020   Procedure:  LAPAROSCOPIC CHOLECYSTECTOMY;  Surgeon: David Loron, MD;  Location: [facility] OR;  Service: General;  Laterality: N/A;   NO PAST SURGERIES      Family History  Problem Relation Age of Onset   Coronary artery disease Mother        died from MI @ age 53 yo      Social History   Socioeconomic History   Marital status: Single    Spouse name: Not on file   Number of children: 0   Years of education: 6th grade   Highest education level: Not on file  Occupational History   Occupation: Freight forwarder: Aeronautical engineer  Tobacco Use   Smoking status: Never   Smokeless tobacco: Never  Vaping Use   Vaping Use: Never used  Substance and Sexual Activity   Alcohol use: No    Alcohol/week: 6.0 standard drinks    Types: 6 Standard drinks or equivalent per week   Drug use: No   Sexual activity: Not Currently    Partners: Female    Comment: declined condoms  Other Topics Concern   Not on file  Social History Narrative   Lives in [place] with his wife and 3 nephews/nieces. Is spanish speaking only - requiring interpretor. He is able to read and write in Spanish only   Social Determinants of Health   Financial Resource Strain: Not on file  Food Insecurity: Not on file  Transportation Needs: Not on file  Physical Activity: Not on file  Stress: Not on file  Social Connections: Not on file    Allergies  Allergen Reactions   Sulfa Antibiotics Other (See Comments)    He believes he has experienced throat swelling with prezcobix and Descovy for nearly a year since April 2017 to present March 22, 2016     Current Outpatient Medications:    acetaminophen (TYLENOL) 500 MG tablet, Take 2 tablets (1,000 mg total) by mouth every 6 (six) hours as needed., Disp: 30 tablet, Rfl: 0   bictegravir-emtricitabine-tenofovir AF (BIKTARVY) 50-200-25 MG TABS tablet, Take 1 tablet by mouth daily., Disp: 30 tablet, Rfl: 11   levothyroxine (SYNTHROID) 200 MCG tablet, Take 1 tablet (200 mcg total)  by mouth daily before breakfast. (Patient taking differently: Take 200 mcg by mouth daily before breakfast. Take with tablet for total dose of daily), Disp: 30 tablet, Rfl: 11   levothyroxine (SYNTHROID) 50 MCG tablet, Take 1 tablet (50 mcg total) by mouth daily before breakfast. Combine with 200 mcg for total  of 250 mcg, Disp: 30 tablet, Rfl: 11   traMADol (ULTRAM) 50 MG tablet, Take 1 tablet (50 mg total) by mouth every 6 (six) hours as needed (mild pain)., Disp: 15 tablet, Rfl: 0   metFORMIN (GLUCOPHAGE) 1000 MG tablet, Take 1 tablet (1,000 mg total) by mouth 2 (two) times daily with a meal. (Patient not taking: Reported on 12/08/2020), Disp: 60 tablet, Rfl: 11   rosuvastatin (CRESTOR) 20 MG tablet, Take 1 tablet (20 mg total) by mouth daily. (Patient not taking: Reported on 12/08/2020), Disp: 30 tablet, Rfl: 11   Review of Systems  Constitutional:  Negative for activity change, appetite change, chills, diaphoresis, fatigue, fever and unexpected weight change.  HENT:  Negative for congestion, rhinorrhea, sinus pressure, sneezing, sore throat and trouble swallowing.   Eyes:  Negative for photophobia and visual disturbance.  Respiratory:  Negative for cough, chest tightness, shortness of breath, wheezing and stridor.   Cardiovascular:  Negative for chest pain, palpitations and leg swelling.  Gastrointestinal:  Negative for abdominal distention, abdominal pain, anal bleeding, blood in stool, constipation, diarrhea, nausea and vomiting.  Genitourinary:  Negative for difficulty urinating, dysuria, flank pain and hematuria.  Musculoskeletal:  Negative for arthralgias, back pain, gait problem, joint swelling and myalgias.  Skin:  Negative for color change, pallor, rash and wound.  Neurological:  Negative for dizziness, tremors, weakness and light-headedness.  Hematological:  Negative for adenopathy. Does not bruise/bleed easily.  Psychiatric/Behavioral:  Negative for agitation, behavioral  problems, confusion, decreased concentration, dysphoric mood and sleep disturbance.     Physical Exam Constitutional:      Appearance: He is well-developed. He is obese.  HENT:     Head: Normocephalic and atraumatic.  Eyes:     Conjunctiva/sclera: Conjunctivae normal.  Cardiovascular:     Rate and Rhythm: Normal rate and regular rhythm.  Pulmonary:     Effort: Pulmonary effort is normal. No respiratory distress.     Breath sounds: No wheezing.  Abdominal:     General: There is no distension.     Palpations: Abdomen is soft.  Musculoskeletal:        General: No tenderness. Normal range of motion.     Cervical back: Normal range of motion and neck supple.  Skin:    General: Skin is warm and dry.     Coloration: Skin is not pale.     Findings: No erythema or rash.  Neurological:     Mental Status: He is alert and oriented to person, place, and time.  Psychiatric:        Mood  and Affect: Mood normal.        Behavior: Behavior normal.        Thought Content: Thought content normal.        Judgment: Judgment normal.       Assessment/Plan:   #1  IV disease:  I am rechecking a viral load CD4 CBC with differential CMP  Continue to his BIKTARVY prescription which have sent and he has renewed his HIV medication assistance program.  2.  Diabetes mellitus he will continue on metformin at 1000 mg twice daily.  Rechecking his A1c.  I am sure he will need additional agents and he needs the expertise of a primary care physician and/or endocrinologist.  Note insulin is covered as are other agents for diabetes mellitus but I am not comfortable prescribing them.  Hypothyroidism: He is on Synthroid 200 mcg and I have asked him to add the 50 mcg tablet.  Note both of these medications are not covered by HIV medication assistance program.  I have put in a referral to community health and wellness in hopes that he can be seen there and that his diabetes mellitus hypothyroidism and and  hyperlipidemia can be managed there.  Hyperlipidemia continuing his Crestor.  Gangrenous cystitis status post cholecystectomy.  Vaccine counseling: we gave him COVID bivalent vaccine and prevnar 20  I spent 42  minutes with the patient including than 50% of the time in face to face counseling of the patient re the above problems, need for PCP,  along with review of medical records in preparation for the visit and during the visit and in coordination of his care.

## 2021-03-05 LAB — CBC WITH DIFFERENTIAL/PLATELET
Absolute Monocytes: 301 cells/uL (ref 200–950)
Basophils Absolute: 41 cells/uL (ref 0–200)
Basophils Relative: 0.7 %
Eosinophils Absolute: 171 cells/uL (ref 15–500)
Eosinophils Relative: 2.9 %
HCT: 40.4 % (ref 38.5–50.0)
Hemoglobin: 14.2 g/dL (ref 13.2–17.1)
Lymphs Abs: 2661 cells/uL (ref 850–3900)
MCH: 33 pg (ref 27.0–33.0)
MCHC: 35.1 g/dL (ref 32.0–36.0)
MCV: 94 fL (ref 80.0–100.0)
MPV: 9.3 fL (ref 7.5–12.5)
Monocytes Relative: 5.1 %
Neutro Abs: 2726 cells/uL (ref 1500–7800)
Neutrophils Relative %: 46.2 %
Platelets: 208 10*3/uL (ref 140–400)
RBC: 4.3 10*6/uL (ref 4.20–5.80)
RDW: 14.4 % (ref 11.0–15.0)
Total Lymphocyte: 45.1 %
WBC: 5.9 10*3/uL (ref 3.8–10.8)

## 2021-03-05 LAB — COMPLETE METABOLIC PANEL WITH GFR
AG Ratio: 1.8 (calc) (ref 1.0–2.5)
ALT: 37 U/L (ref 9–46)
AST: 47 U/L — ABNORMAL HIGH (ref 10–40)
Albumin: 4.9 g/dL (ref 3.6–5.1)
Alkaline phosphatase (APISO): 72 U/L (ref 36–130)
BUN: 17 mg/dL (ref 7–25)
CO2: 25 mmol/L (ref 20–32)
Calcium: 9.6 mg/dL (ref 8.6–10.3)
Chloride: 103 mmol/L (ref 98–110)
Creat: 1.21 mg/dL (ref 0.60–1.29)
Globulin: 2.7 g/dL (calc) (ref 1.9–3.7)
Glucose, Bld: 153 mg/dL — ABNORMAL HIGH (ref 65–99)
Potassium: 3.7 mmol/L (ref 3.5–5.3)
Sodium: 137 mmol/L (ref 135–146)
Total Bilirubin: 1.1 mg/dL (ref 0.2–1.2)
Total Protein: 7.6 g/dL (ref 6.1–8.1)
eGFR: 77 mL/min/{1.73_m2} (ref >=60)

## 2021-03-05 LAB — HEMOGLOBIN A1C
Hgb A1c MFr Bld: 7.2 % of total Hgb — ABNORMAL HIGH (ref <5.7)
Mean Plasma Glucose: 160 mg/dL
eAG (mmol/L): 8.9 mmol/L

## 2021-03-05 LAB — HIV-1 RNA QUANT-NO REFLEX-BLD
HIV 1 RNA Quant: NOT DETECTED Copies/mL
HIV-1 RNA Quant, Log: NOT DETECTED Log cps/mL

## 2021-03-05 LAB — T4, FREE: Free T4: 0.4 ng/dL — ABNORMAL LOW (ref 0.8–1.8)

## 2021-03-05 LAB — C. TRACHOMATIS/N. GONORRHOEAE RNA
C. trachomatis RNA, TMA: NOT DETECTED
N. gonorrhoeae RNA, TMA: NOT DETECTED

## 2021-03-05 LAB — RPR: RPR Ser Ql: NONREACTIVE

## 2021-03-05 LAB — T-HELPER CELLS (CD4) COUNT (NOT AT ARMC)
Absolute CD4: 729 cells/uL (ref 490–1740)
CD4 T Helper %: 27 % — ABNORMAL LOW (ref 30–61)
Total lymphocyte count: 2680 cells/uL (ref 850–3900)

## 2021-03-05 LAB — TSH+FREE T4: TSH W/REFLEX TO FT4: 42.63 mIU/L — ABNORMAL HIGH (ref 0.40–4.50)

## 2021-03-07 ENCOUNTER — Telehealth: Payer: Self-pay

## 2021-03-07 NOTE — Telephone Encounter (Signed)
Appointment has been made  ? ?Interpreter ZE#[id number] ?

## 2021-03-07 NOTE — Telephone Encounter (Signed)
-----   Message from Storm Frisk, MD sent at 03/04/2021  4:59 PM EST ----- ?Regarding: RE: uninsured pt with hypothyoridism and DM ?Thank you,  Emmabelle Fear, lets try to get this pt in with me in march somehow ?----- Message ----- ?From: Daiva Eves, Lisette Grinder, MD ?Sent: 03/04/2021   2:36 PM EST ?To: Romero Belling, MD, Storm Frisk, MD ?Subject: uninsured pt with hypothyoridism and DM       ? ?Pat I put referral in for This gentleman to [facility], He is Spanish speaking. It is probably wishful thinking Gregary Signs that I can get endocrine to see him without insurance. The HIV medication assistance program DOES cover insulin in additoin to metformin and some other oral drugs burt I am not comfortable with insulin in partticular. Synthroid is not covered and he has been having to pay out of pocket for that ? ?Just reaching out in case someone can help connect him from your side Dennie Bible ?----- Message ----- ?From: Interface, [facility] Results In ?Sent: 03/03/2021  10:44 PM EST ?To: Randall Hiss, MD ? ? ? ?

## 2021-03-15 ENCOUNTER — Ambulatory Visit: Payer: Self-pay | Admitting: Endocrinology

## 2021-03-20 NOTE — Progress Notes (Deleted)
? ?New Patient Office Visit ? ?Subjective:  ?Patient ID: David Knapp, male    DOB: 1980/06/29  Age: 41 y.o. MRN: [id number] ? ?CC: No chief complaint on file. ? ? ?HPI ?David Knapp presents for  ?Admit date: 12/07/2020 ?Discharge date: 12/09/2020 ?  ?Admitting Diagnosis: ?Acute cholecystitis ?  ?Discharge Diagnosis ?    ?Patient Active Problem List  ?  Diagnosis Date Noted  ? Biliary colic 0000000  ? Type 2 diabetes mellitus with hyperglycemia ([place]) 06/04/2019  ? Hyperglycemia 06/03/2019  ? Peripheral edema 04/24/2017  ? Hypothyroidism 04/24/2017  ? Hyponatremia 04/24/2017  ? CKD (chronic kidney disease), stage III ([place]) 04/24/2017  ? CHF (congestive heart failure) ([place]) 04/23/2017  ? Allergic reaction 03/22/2016  ? Throat swelling 03/22/2016  ? Change in voice 03/22/2016  ? Acute kidney injury ([place])    ? Strep throat 04/28/2015  ? Generalized edema    ? CKD (chronic kidney disease), stage II    ? Strep pharyngitis    ? 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 Acute renal failure ([place]) 07/15/2014  ? Orthostatic hypotension 05/20/2012  ? Fever 05/15/2012  ? HIV disease ([place]) 05/15/2012  ? SIRS (systemic inflammatory response syndrome) ([place]) 05/15/2012  ? Viral warts 07/26/2007  ? PSORIASIS 02/13/2007  ? SKIN RASH 01/25/2007  ? Pancytopenia ([place]) 08/01/2006  ? SEPTIC SHOCK 08/01/2006  ? PNEUMONIA, HX OF 07/31/2006  ? Disseminated histoplasmosis 06/28/2006  ?Acute cholecystitis ?Flu ?  ?Consultants ?none ?  ?Reason for Admission: ?This is a 42 yo Spanish speaking male with a history of DM, HIV (on meds), and hypothyroidism who began having RUQ abdominal pain yesterday morning.  He has no other associated symptoms such as N/V/D, fevers, chest pain, SOB, etc.  His pain worsened throughout the day and he presented to the ED for evaluation.  He was given pain medication which has helped his pain some.  His WBC and LFTs are normal, but he has an [place] that revealed cholelithiasis with a stone lodged in the neck of  the gallbladder.  His CBG is 161 and cr around 1.2 which is close to his baseline. We have been asked to see him for further evaluation. ?  ?Procedures ?Lap chole, with ICG dye, Dr. Donne Hazel 12/08/20 ?  ?Hospital Course:  ?The patient was admitted and underwent a laparoscopic cholecystectomy.  The patient tolerated the procedure well.  On POD 1, the patient was tolerating a regular diet, voiding well, mobilizing, and pain was controlled with oral pain medications.  The patient was stable for DC home at this time with appropriate follow up made.  His other medical problems remained stable during his admission. ?  ?Foot urine alb tdapeye  ? ?Sees HIV MD David Knapp: ?#1  IV disease: ? ?I am rechecking a viral load CD4 CBC with differential CMP ? ?Continue to his BIKTARVY prescription which have sent and he has renewed his HIV medication assistance program. ? ?2.  Diabetes mellitus he will continue on metformin at 1000 mg twice daily.  Rechecking his A1c.  I am sure he will need additional agents and he needs the expertise of a primary care physician and/or endocrinologist. ? ?Note insulin is covered as are other agents for diabetes mellitus but I am not comfortable prescribing them. ? ?Hypothyroidism: He is on Synthroid 200 mcg and I have asked him to add the 50 mcg tablet. ? ?Note both of these medications are not covered by HIV medication assistance program. ? ?I have put in a referral to  community health and wellness in hopes that he can be seen there and that his diabetes mellitus hypothyroidism and and hyperlipidemia can be managed there. ? ?Hyperlipidemia continuing his Crestor. ?  ?Gangrenous cystitis status post cholecystectomy. ? ?Vaccine counseling: we gave him COVID bivalent vaccine and prevnar 20 ?Past Medical History:  ?Diagnosis Date  ? Allergic reaction 03/22/2016  ? Change in voice 03/22/2016  ? CHF (congestive heart failure) ([place]) 04/23/2017  ? CKD (chronic kidney disease), stage II   ? Disseminated  histoplasmosis 06/28/2006  ? History of in 2008. Possible recurrence 2014 (currently being evaluated 05/2012)  ? Edema 04/27/2015  ? Gangrenous cholecystitis 03/03/2021  ? HIV (human immunodeficiency virus infection) ([place])   ? Hyperglycemia 06/03/2019  ? Hypothyroidism 04/24/2017  ? Leg swelling 04/2017  ? Pancytopenia ([place])   ? 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 Throat swelling 03/22/2016  ? Type 2 diabetes mellitus with hyperglycemia ([place]) 06/04/2019  ? ? ?Past Surgical History:  ?Procedure Laterality Date  ? CHOLECYSTECTOMY N/A 12/08/2020  ? Procedure: LAPAROSCOPIC CHOLECYSTECTOMY;  Surgeon: Rolm Bookbinder, MD;  Location: [place];  Service: General;  Laterality: N/A;  ? NO PAST SURGERIES    ? ? ?Family History  ?Problem Relation Age of Onset  ? Coronary artery disease Mother   ?     died from [place] @ age 87 yo  ? ? ?Social History  ? ?Socioeconomic History  ? Marital status: Single  ?  Spouse name: Not on file  ? Number of children: 0  ? Years of education: 6th grade  ? Highest education level: Not on file  ?Occupational History  ? Occupation: Landscaping  ?  Employer: landscaping  ?Tobacco Use  ? Smoking status: Never  ? Smokeless tobacco: Never  ?Vaping Use  ? Vaping Use: Never used  ?Substance and Sexual Activity  ? Alcohol use: No  ?  Alcohol/week: 6.0 standard drinks  ?  Types: 6 Standard drinks or equivalent per week  ? Drug use: No  ? Sexual activity: Not Currently  ?  Partners: Female  ?  Comment: declined condoms  ?Other Topics Concern  ? Not on file  ?Social History Narrative  ? Lives in [place] with his wife and 3 nephews/nieces. Is spanish speaking only - requiring interpretor. He is able to read and write in Spanish only  ? ?Social Determinants of Health  ? ?Financial Resource Strain: Not on file  ?Food Insecurity: Not on file  ?Transportation Needs: Not on file  ?Physical Activity: Not on file  ?Stress: Not on file  ?Social Connections: Not on file  ?Intimate Partner Violence: Not on file   ? ? ?ROS ?Review of Systems ? ?Objective:  ? ?Today's Vitals: There were no vitals taken for this visit. ? ?Physical Exam ? ?Assessment & Plan:  ? ?Problem List Items Addressed This Visit   ?None ? ? ?Outpatient Encounter Medications as of 03/21/2021  ?Medication Sig  ? acetaminophen (TYLENOL) 500 MG tablet Take 2 tablets (1,000 mg total) by mouth every 6 (six) hours as needed.  ? bictegravir-emtricitabine-tenofovir AF (BIKTARVY) 50-200-25 MG TABS tablet Take 1 tablet by mouth daily.  ? levothyroxine (SYNTHROID) 200 MCG tablet Take 1 tablet (200 mcg total) by mouth daily before breakfast. In Spanish please combine 200 g tablet with 42mcg tablet for total of 250 mcg/day  ? levothyroxine (SYNTHROID) 50 MCG tablet Take 1 tablet (50 mcg total) by mouth daily before breakfast. Combine with 200 mcg for total  of 250 mcg  ? metFORMIN (  GLUCOPHAGE) 1000 MG tablet Take 1 tablet (1,000 mg total) by mouth 2 (two) times daily with a meal.  ? rosuvastatin (CRESTOR) 20 MG tablet Take 1 tablet (20 mg total) by mouth daily.  ? traMADol (ULTRAM) 50 MG tablet Take 1 tablet (50 mg total) by mouth every 6 (six) hours as needed (mild pain).  ? ?No facility-administered encounter medications on file as of 03/21/2021.  ? ? ?Follow-up: No follow-ups on file.  ? ?Asencion Noble, MD ? ?

## 2021-03-21 ENCOUNTER — Ambulatory Visit: Payer: Self-pay | Admitting: Critical Care Medicine

## 2021-04-18 ENCOUNTER — Encounter: Payer: Self-pay | Admitting: Infectious Disease

## 2021-04-18 ENCOUNTER — Other Ambulatory Visit: Payer: Self-pay

## 2021-04-18 ENCOUNTER — Ambulatory Visit: Payer: Self-pay | Admitting: Infectious Disease

## 2021-04-18 VITALS — BP 102/68 | HR 67 | Temp 98.3°F | Wt 237.0 lb

## 2021-04-18 DIAGNOSIS — N182 Chronic kidney disease, stage 2 (mild): Secondary | ICD-10-CM

## 2021-04-18 DIAGNOSIS — B399 Histoplasmosis, unspecified: Secondary | ICD-10-CM

## 2021-04-18 DIAGNOSIS — E1165 Type 2 diabetes mellitus with hyperglycemia: Secondary | ICD-10-CM

## 2021-04-18 DIAGNOSIS — E039 Hypothyroidism, unspecified: Secondary | ICD-10-CM

## 2021-04-18 DIAGNOSIS — B20 Human immunodeficiency virus [HIV] disease: Secondary | ICD-10-CM

## 2021-04-18 MED ORDER — METFORMIN HCL 1000 MG PO TABS: 1000.0000 mg | ORAL_TABLET | Freq: Two times a day (BID) | ORAL | 11 refills | Status: DC

## 2021-04-18 MED ORDER — LEVOTHYROXINE SODIUM 50 MCG PO TABS: 50.0000 ug | ORAL_TABLET | Freq: Every day | ORAL | 11 refills | Status: DC

## 2021-04-18 MED ORDER — ROSUVASTATIN CALCIUM 20 MG PO TABS: 20.0000 mg | ORAL_TABLET | Freq: Every day | ORAL | 11 refills | Status: DC

## 2021-04-18 MED ORDER — BIKTARVY 50-200-25 MG PO TABS: 1.0000 | ORAL_TABLET | Freq: Every day | ORAL | 11 refills | Status: DC

## 2021-04-18 MED ORDER — LEVOTHYROXINE SODIUM 200 MCG PO TABS: 200.0000 ug | ORAL_TABLET | Freq: Every day | ORAL | 11 refills | Status: DC

## 2021-04-18 NOTE — Progress Notes (Signed)
? ? ? ? ? ? ? ?HIV Positive/AIDS  ? ?Chief complaint : followup for HIV disease, hypthyroidism ? 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  ? ?He had been previously treated with PREZCOBIX and DESCOVY with nice virological suppression recently changed to [facility] with continued suppression.  ? ?Does have comorbid obesity diabetes mellitus and hypothyroidism. ? ?He is taking metformin at 1000 mg twice daily.  He is taking Synthroid 200 grams daily though this latter medication is not covered with HIV medication assistance program.  I did try to escalate his Synthroid to 250 mcg but this appears have not happened. ? ?He desperately needs to be engaged with primary care and/or endocrinology. ? ?He was recently admitted to the hospital for gangrenous cholecystitis and underwent cholecystectomy. ? ?He also was diagnosed with influenza. ? ?Since I last saw him his TSH and T4 were still floridly abnormal I once again sent in prescriptions for both the 200 mcg Synthroid in the 50 mcg doses but he is only taking the 200 mcg dose his A1c has come down to 7. ? ?He did no-show to the appointment with community health and wellness as well as endocrinology referral that I made. ? ? ? ? ? ?Past Medical History:  ?Diagnosis Date  ? Allergic reaction 03/22/2016  ? Change in voice 03/22/2016  ? CHF (congestive heart failure) (HCC) 04/23/2017  ? CKD (chronic kidney disease), stage II   ? 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 History of in 2008. Possible recurrence 2014 (currently being evaluated 05/2012)  ? Edema 04/27/2015  ? Gangrenous cholecystitis 03/03/2021  ? HIV (human immunodeficiency virus infection) (HCC)   ? Hyperglycemia 06/03/2019  ? Hypothyroidism 04/24/2017  ? Leg swelling 04/2017  ? Pancytopenia (HCC)   ? 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 Throat swelling 03/22/2016  ? Type 2 diabetes mellitus with hyperglycemia (HCC) 06/04/2019  ? ? ?Past Surgical History:  ?Procedure Laterality Date  ? CHOLECYSTECTOMY N/A 12/08/2020  ? Procedure: LAPAROSCOPIC CHOLECYSTECTOMY;  Surgeon: Emelia Loron, MD;  Location: [facility] OR;  Service: General;  Laterality: N/A;  ? NO PAST SURGERIES    ? ? ?Family History  ?Problem Relation Age of Onset  ? Coronary artery disease Mother   ?     died from MI @ age 70 yo  ? ? ?  ?Social History  ? ?Socioeconomic History  ? Marital status: Single  ?  Spouse name: Not on file  ? Number of children: 0  ? Years of education: 6th grade  ? Highest education level: Not on file  ?Occupational History  ? Occupation: Landscaping  ?  Employer: landscaping  ?Tobacco Use  ? Smoking status: Never  ? Smokeless tobacco: Never  ?Vaping Use  ? Vaping Use: Never used  ?Substance and Sexual Activity  ? Alcohol use: No  ?  Alcohol/week: 6.0 standard drinks  ?  Types: 6 Standard drinks or equivalent per week  ? Drug use: No  ? Sexual activity: Not Currently  ?  Partners: Female  ?  Comment: declined condoms  ?Other Topics Concern  ? Not on file  ?Social History Narrative  ? Lives in [place] with his wife and 3 nephews/nieces. Is spanish speaking only - requiring interpretor. He is able to read and write in Spanish only  ? ?Social Determinants of Health  ? ?  Financial Resource Strain: Not on file  ?Food Insecurity: Not on file  ?Transportation Needs: Not on file  ?Physical Activity: Not on file  ?Stress: Not on file  ?Social Connections: Not on file  ? ? ?Allergies  ?Allergen Reactions  ? Sulfa Antibiotics Other (See Comments)  ?  He believes he has experienced throat swelling with prezcobix and Descovy for nearly a year since April 2017 to present March 22, 2016  ? ? ? ?Current Outpatient Medications:  ?  acetaminophen (TYLENOL) 500 MG tablet, Take 2 tablets (1,000 mg total) by mouth every 6 (six) hours as needed., Disp: 30  tablet, Rfl: 0 ?  bictegravir-emtricitabine-tenofovir AF (BIKTARVY) 50-200-25 MG TABS tablet, Take 1 tablet by mouth daily., Disp: 30 tablet, Rfl: 11 ?  levothyroxine (SYNTHROID) 200 MCG tablet, Take 1 tablet (200 mcg total) by mouth daily before breakfast. In Spanish please combine 200 g tablet with 50mcg tablet for total of 250 mcg/day, Disp: 30 tablet, Rfl: 11 ?  levothyroxine (SYNTHROID) 50 MCG tablet, Take 1 tablet (50 mcg total) by mouth daily before breakfast. Combine with 200 mcg for total  of 250 mcg, Disp: 30 tablet, Rfl: 11 ?  metFORMIN (GLUCOPHAGE) 1000 MG tablet, Take 1 tablet (1,000 mg total) by mouth 2 (two) times daily with a meal., Disp: 60 tablet, Rfl: 11 ?  rosuvastatin (CRESTOR) 20 MG tablet, Take 1 tablet (20 mg total) by mouth daily., Disp: 30 tablet, Rfl: 11 ?  traMADol (ULTRAM) 50 MG tablet, Take 1 tablet (50 mg total) by mouth every 6 (six) hours as needed (mild pain)., Disp: 15 tablet, Rfl: 0 ? ? ?Review of Systems  ?Constitutional:  Negative for activity change, appetite change, chills, diaphoresis, fatigue, fever and unexpected weight change.  ?HENT:  Negative for congestion, rhinorrhea, sinus pressure, sneezing, sore throat and trouble swallowing.   ?Eyes:  Negative for photophobia and visual disturbance.  ?Respiratory:  Negative for cough, chest tightness, shortness of breath, wheezing and stridor.   ?Cardiovascular:  Negative for chest pain, palpitations and leg swelling.  ?Gastrointestinal:  Negative for abdominal distention, abdominal pain, anal bleeding, blood in stool, constipation, diarrhea, nausea and vomiting.  ?Genitourinary:  Negative for difficulty urinating, dysuria, flank pain and hematuria.  ?Musculoskeletal:  Negative for arthralgias, back pain, gait problem, joint swelling and myalgias.  ?Skin:  Negative for color change, pallor, rash and wound.  ?Neurological:  Negative for dizziness, tremors, weakness and light-headedness.  ?Hematological:  Negative for adenopathy.  Does not bruise/bleed easily.  ?Psychiatric/Behavioral:  Negative for agitation, behavioral problems, confusion, decreased concentration, dysphoric mood and sleep disturbance.   ? ? ?Physical Exam ?Constitutional:   ?   Appearance: He is well-developed. He is obese.  ?HENT:  ?   Head: Normocephalic and atraumatic.  ?Eyes:  ?   Conjunctiva/sclera: Conjunctivae normal.  ?Cardiovascular:  ?   Rate and Rhythm: Normal rate and regular rhythm.  ?Pulmonary:  ?   Effort: Pulmonary effort is normal. No respiratory distress.  ?   Breath sounds: No wheezing.  ?Abdominal:  ?   General: There is no distension.  ?   Palpations: Abdomen is soft.  ?Musculoskeletal:     ?   General: No tenderness. Normal range of motion.  ?   Cervical back: Normal range of motion and neck supple.  ?Skin: ?   General: Skin is warm and dry.  ?   Coloration: Skin is not pale.  ?   Findings: No erythema or rash.  ?Neurological:  ?  General: No focal deficit present.  ?   Mental Status: He is alert and oriented to person, place, and time.  ?Psychiatric:     ?   Mood and Affect: Mood normal.     ?   Behavior: Behavior normal.     ?   Thought Content: Thought content normal.     ?   Judgment: Judgment normal.  ? ? ? ? ? ?Assessment/Plan:  ? ?   ? ?HIV disease: I am checking a viral load CD4 CBC CMP ? ?We will continue his Biktarvy rx ? ? ?DM: Continue him on metformin 1000 mg twice daily A1c reviewed at come down to 7 range ? ?Hypothyroidism: Have sent in prescriptions yet again for 200 mcg and 50 mcg to the [facility] on [address] [place]. ? ?Hopefully he actually begins taking both him I am also rechecking his thyroid function today ? ?Obesity: if could lose weight would likely cure his DM ? ?

## 2021-04-20 LAB — HELPER T-LYMPH-CD4 (ARMC ONLY)
% CD 4 Pos. Lymph.: 24.8 % — ABNORMAL LOW (ref 30.8–58.5)
Absolute CD 4 Helper: 595 /uL (ref 359–1519)
Basophils Absolute: 0 10*3/uL (ref 0.0–0.2)
Basos: 1 %
EOS (ABSOLUTE): 0.1 10*3/uL (ref 0.0–0.4)
Eos: 2 %
Hematocrit: 44.8 % (ref 37.5–51.0)
Hemoglobin: 15.5 g/dL (ref 13.0–17.7)
Immature Grans (Abs): 0 10*3/uL (ref 0.0–0.1)
Immature Granulocytes: 0 %
Lymphocytes Absolute: 2.4 10*3/uL (ref 0.7–3.1)
Lymphs: 37 %
MCH: 33.3 pg — ABNORMAL HIGH (ref 26.6–33.0)
MCHC: 34.6 g/dL (ref 31.5–35.7)
MCV: 96 fL (ref 79–97)
Monocytes Absolute: 0.4 10*3/uL (ref 0.1–0.9)
Monocytes: 7 %
Neutrophils Absolute: 3.4 10*3/uL (ref 1.4–7.0)
Neutrophils: 53 %
Platelets: 218 10*3/uL (ref 150–450)
RBC: 4.65 x10E6/uL (ref 4.14–5.80)
RDW: 13.6 % (ref 11.6–15.4)
WBC: 6.4 10*3/uL (ref 3.4–10.8)

## 2021-04-20 LAB — T-HELPER CELLS (CD4) COUNT (NOT AT ARMC)

## 2021-04-23 LAB — COMPLETE METABOLIC PANEL WITH GFR
AG Ratio: 1.5 (calc) (ref 1.0–2.5)
ALT: 35 U/L (ref 9–46)
AST: 38 U/L (ref 10–40)
Albumin: 4.6 g/dL (ref 3.6–5.1)
Alkaline phosphatase (APISO): 76 U/L (ref 36–130)
BUN: 24 mg/dL (ref 7–25)
CO2: 27 mmol/L (ref 20–32)
Calcium: 9.8 mg/dL (ref 8.6–10.3)
Chloride: 105 mmol/L (ref 98–110)
Creat: 1.25 mg/dL (ref 0.60–1.29)
Globulin: 3 g/dL (calc) (ref 1.9–3.7)
Glucose, Bld: 125 mg/dL — ABNORMAL HIGH (ref 65–99)
Potassium: 4 mmol/L (ref 3.5–5.3)
Sodium: 140 mmol/L (ref 135–146)
Total Bilirubin: 1.1 mg/dL (ref 0.2–1.2)
Total Protein: 7.6 g/dL (ref 6.1–8.1)
eGFR: 74 mL/min/{1.73_m2} (ref >=60)

## 2021-04-23 LAB — CBC WITH DIFFERENTIAL/PLATELET
Absolute Monocytes: 416 cells/uL (ref 200–950)
Basophils Absolute: 40 cells/uL (ref 0–200)
Basophils Relative: 0.6 %
Eosinophils Absolute: 119 cells/uL (ref 15–500)
Eosinophils Relative: 1.8 %
HCT: 43.3 % (ref 38.5–50.0)
Hemoglobin: 14.8 g/dL (ref 13.2–17.1)
Lymphs Abs: 2554 cells/uL (ref 850–3900)
MCH: 32 pg (ref 27.0–33.0)
MCHC: 34.2 g/dL (ref 32.0–36.0)
MCV: 93.5 fL (ref 80.0–100.0)
MPV: 9.5 fL (ref 7.5–12.5)
Monocytes Relative: 6.3 %
Neutro Abs: 3472 cells/uL (ref 1500–7800)
Neutrophils Relative %: 52.6 %
Platelets: 217 10*3/uL (ref 140–400)
RBC: 4.63 10*6/uL (ref 4.20–5.80)
RDW: 13.4 % (ref 11.0–15.0)
Total Lymphocyte: 38.7 %
WBC: 6.6 10*3/uL (ref 3.8–10.8)

## 2021-04-23 LAB — TSH+FREE T4: TSH W/REFLEX TO FT4: 32.79 mIU/L — ABNORMAL HIGH (ref 0.40–4.50)

## 2021-04-23 LAB — HIV RNA, RTPCR W/R GT (RTI, PI,INT)
HIV 1 RNA Quant: NOT DETECTED copies/mL
HIV-1 RNA Quant, Log: NOT DETECTED Log copies/mL

## 2021-04-23 LAB — RPR: RPR Ser Ql: NONREACTIVE

## 2021-04-23 LAB — T4, FREE: Free T4: 0.8 ng/dL (ref 0.8–1.8)

## 2021-04-26 ENCOUNTER — Telehealth: Payer: Self-pay

## 2021-04-26 NOTE — Telephone Encounter (Signed)
Spoke with patient, scheduled him for this Thursday to discuss thyroid medication. Asked him to please bring his medications with him to his appointment. Patient verbalized understanding and has no further questions.  ? ?[place] interpreters ?Nori Riis ID [id number] ? ?Beryle Flock, RN ? ?

## 2021-04-26 NOTE — Telephone Encounter (Signed)
-----   Message from Truman Hayward, MD sent at 04/25/2021  3:33 PM EDT ----- ?Regarding: RE: ? recs on his synthroid ?Thanks so much [place], I will see if we can bring him in to go over this again. 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 recs on his synthroid                   ? ?Dear Jac Canavan, ?The problem may be that he is not taking the levothyroxine correctly.  It is very important to follow the following rules: ?Take the thyroid hormone every day, with water, at least 30 minutes before breakfast, separated by at least 4 hours from: ?- acid reflux medications ?- calcium ?- iron ?- multivitamins ?For now, it is okay to increase the dose like you mentioned, but also make sure that he is aware of the above rules. ?Sincerely, ?Salena Saner ? ?----- Message ----- ?From: Tommy Medal, Lavell Islam, MD ?Sent: 04/23/2021  10:44 AM EDT ?To: Philemon Kingdom, MD ?Subject: ? recs on his synthroid                       ? ?Karlene Lineman I been try to get this gentleman plugged in with primary care with [facility] health and wellness as well as with endocrine but I think he missed an appointment with Hilliard Clark and with the primary care folks he is currently on 200 mcg of Synthroid which she has to pay for out-of-pocket I had added 50 mcg but he does not appear to have ever started it his TSH is still quite elevated and T4 came back at 0.8.  I had asked him to go ahead and start the 50 mcg in addition to the 200 mcg prior to test coming back any thoughts recs too bad I am no good at primary care! ?----- Message ----- ?From: Interface, [facility] Results In ?Sent: 04/18/2021  10:44 PM EDT ?To: Truman Hayward, MD ? ? ? ? ?

## 2021-04-28 ENCOUNTER — Ambulatory Visit: Payer: Self-pay | Admitting: Infectious Disease

## 2021-07-18 ENCOUNTER — Ambulatory Visit: Payer: Self-pay | Admitting: Family

## 2021-10-12 ENCOUNTER — Other Ambulatory Visit: Payer: Self-pay

## 2021-10-12 ENCOUNTER — Encounter: Payer: Self-pay | Admitting: Infectious Disease

## 2021-10-12 ENCOUNTER — Ambulatory Visit: Payer: Self-pay

## 2021-10-12 DIAGNOSIS — Z79899 Other long term (current) drug therapy: Secondary | ICD-10-CM

## 2021-10-12 DIAGNOSIS — B20 Human immunodeficiency virus [HIV] disease: Secondary | ICD-10-CM

## 2021-10-12 DIAGNOSIS — Z113 Encounter for screening for infections with a predominantly sexual mode of transmission: Secondary | ICD-10-CM

## 2021-10-13 ENCOUNTER — Other Ambulatory Visit: Payer: Self-pay | Admitting: Pharmacist

## 2021-10-13 DIAGNOSIS — B20 Human immunodeficiency virus [HIV] disease: Secondary | ICD-10-CM

## 2021-10-13 LAB — URINE CYTOLOGY ANCILLARY ONLY
Chlamydia: NEGATIVE
Comment: NEGATIVE
Comment: NORMAL
Neisseria Gonorrhea: NEGATIVE

## 2021-10-13 LAB — T-HELPER CELL (CD4) - (RCID CLINIC ONLY)
CD4 % Helper T Cell: 19 % — ABNORMAL LOW (ref 33–65)
CD4 T Cell Abs: 682 /uL (ref 400–1790)

## 2021-10-13 MED ORDER — BICTEGRAVIR-EMTRICITAB-TENOFOV 50-200-25 MG PO TABS: 1.0000 | ORAL_TABLET | Freq: Every day | ORAL | 0 refills | Status: DC

## 2021-10-13 NOTE — Progress Notes (Signed)
Medication Samples have been provided to the patient.  Drug name: Biktarvy        Strength: 50/200/25 mg       Qty: 14 tablets (2 bottles) LOT: [id number]   Exp.Date: 9/25  Dosing instructions: Take one tablet by mouth once daily  The patient has been instructed regarding the correct time, dose, and frequency of taking this medication, including desired effects and most common side effects.   Cardell Rachel, PharmD, CPP, BCIDP Clinical Pharmacist Practitioner Infectious Diseases Clinical Pharmacist [facility]  

## 2021-10-15 LAB — CBC WITH DIFFERENTIAL/PLATELET
Absolute Monocytes: 407 cells/uL (ref 200–950)
Basophils Absolute: 50 cells/uL (ref 0–200)
Basophils Relative: 0.6 %
Eosinophils Absolute: 224 cells/uL (ref 15–500)
Eosinophils Relative: 2.7 %
HCT: 45 % (ref 38.5–50.0)
Hemoglobin: 15.8 g/dL (ref 13.2–17.1)
Lymphs Abs: 4001 cells/uL — ABNORMAL HIGH (ref 850–3900)
MCH: 31.9 pg (ref 27.0–33.0)
MCHC: 35.1 g/dL (ref 32.0–36.0)
MCV: 90.9 fL (ref 80.0–100.0)
MPV: 9.2 fL (ref 7.5–12.5)
Monocytes Relative: 4.9 %
Neutro Abs: 3619 cells/uL (ref 1500–7800)
Neutrophils Relative %: 43.6 %
Platelets: 228 10*3/uL (ref 140–400)
RBC: 4.95 10*6/uL (ref 4.20–5.80)
RDW: 14.6 % (ref 11.0–15.0)
Total Lymphocyte: 48.2 %
WBC: 8.3 10*3/uL (ref 3.8–10.8)

## 2021-10-15 LAB — COMPLETE METABOLIC PANEL WITH GFR
AG Ratio: 1.6 (calc) (ref 1.0–2.5)
ALT: 39 U/L (ref 9–46)
AST: 42 U/L — ABNORMAL HIGH (ref 10–40)
Albumin: 4.9 g/dL (ref 3.6–5.1)
Alkaline phosphatase (APISO): 82 U/L (ref 36–130)
BUN: 19 mg/dL (ref 7–25)
CO2: 24 mmol/L (ref 20–32)
Calcium: 9.9 mg/dL (ref 8.6–10.3)
Chloride: 104 mmol/L (ref 98–110)
Creat: 1.23 mg/dL (ref 0.60–1.29)
Globulin: 3.1 g/dL (calc) (ref 1.9–3.7)
Glucose, Bld: 144 mg/dL — ABNORMAL HIGH (ref 65–99)
Potassium: 3.9 mmol/L (ref 3.5–5.3)
Sodium: 138 mmol/L (ref 135–146)
Total Bilirubin: 1.1 mg/dL (ref 0.2–1.2)
Total Protein: 8 g/dL (ref 6.1–8.1)
eGFR: 75 mL/min/{1.73_m2} (ref >=60)

## 2021-10-15 LAB — HIV-1 RNA QUANT-NO REFLEX-BLD
HIV 1 RNA Quant: <20 Copies/mL — ABNORMAL HIGH
HIV-1 RNA Quant, Log: <1.3 Log cps/mL — ABNORMAL HIGH

## 2021-10-15 LAB — LIPID PANEL
Cholesterol: 222 mg/dL — ABNORMAL HIGH (ref <200)
HDL: 37 mg/dL — ABNORMAL LOW (ref >=40)
Non-HDL Cholesterol (Calc): 185 mg/dL (calc) — ABNORMAL HIGH (ref <130)
Total CHOL/HDL Ratio: 6 (calc) — ABNORMAL HIGH (ref <5.0)
Triglycerides: 598 mg/dL — ABNORMAL HIGH (ref <150)

## 2021-10-24 ENCOUNTER — Ambulatory Visit: Payer: Self-pay | Admitting: Infectious Disease

## 2022-03-09 ENCOUNTER — Other Ambulatory Visit: Payer: Self-pay

## 2022-03-09 DIAGNOSIS — Z113 Encounter for screening for infections with a predominantly sexual mode of transmission: Secondary | ICD-10-CM

## 2022-03-09 DIAGNOSIS — B20 Human immunodeficiency virus [HIV] disease: Secondary | ICD-10-CM

## 2022-03-10 LAB — URINE CYTOLOGY ANCILLARY ONLY
Chlamydia: NEGATIVE
Comment: NEGATIVE
Comment: NORMAL
Neisseria Gonorrhea: NEGATIVE

## 2022-03-10 LAB — T-HELPER CELL (CD4) - (RCID CLINIC ONLY)
CD4 % Helper T Cell: 19 % — ABNORMAL LOW (ref 33–65)
CD4 T Cell Abs: 430 /uL (ref 400–1790)

## 2022-03-12 LAB — CBC WITH DIFFERENTIAL/PLATELET
Absolute Monocytes: 342 cells/uL (ref 200–950)
Basophils Absolute: 30 cells/uL (ref 0–200)
Basophils Relative: 0.5 %
Eosinophils Absolute: 108 cells/uL (ref 15–500)
Eosinophils Relative: 1.8 %
HCT: 44.9 % (ref 38.5–50.0)
Hemoglobin: 15.2 g/dL (ref 13.2–17.1)
Lymphs Abs: 2760 cells/uL (ref 850–3900)
MCH: 31.8 pg (ref 27.0–33.0)
MCHC: 33.9 g/dL (ref 32.0–36.0)
MCV: 93.9 fL (ref 80.0–100.0)
MPV: 9.5 fL (ref 7.5–12.5)
Monocytes Relative: 5.7 %
Neutro Abs: 2760 cells/uL (ref 1500–7800)
Neutrophils Relative %: 46 %
Platelets: 207 10*3/uL (ref 140–400)
RBC: 4.78 10*6/uL (ref 4.20–5.80)
RDW: 13.5 % (ref 11.0–15.0)
Total Lymphocyte: 46 %
WBC: 6 10*3/uL (ref 3.8–10.8)

## 2022-03-12 LAB — COMPLETE METABOLIC PANEL WITH GFR
AG Ratio: 1.6 (calc) (ref 1.0–2.5)
ALT: 38 U/L (ref 9–46)
AST: 34 U/L (ref 10–40)
Albumin: 4.6 g/dL (ref 3.6–5.1)
Alkaline phosphatase (APISO): 79 U/L (ref 36–130)
BUN: 16 mg/dL (ref 7–25)
CO2: 26 mmol/L (ref 20–32)
Calcium: 9.7 mg/dL (ref 8.6–10.3)
Chloride: 102 mmol/L (ref 98–110)
Creat: 1.19 mg/dL (ref 0.60–1.29)
Globulin: 2.8 g/dL (calc) (ref 1.9–3.7)
Glucose, Bld: 244 mg/dL — ABNORMAL HIGH (ref 65–99)
Potassium: 4.1 mmol/L (ref 3.5–5.3)
Sodium: 137 mmol/L (ref 135–146)
Total Bilirubin: 1 mg/dL (ref 0.2–1.2)
Total Protein: 7.4 g/dL (ref 6.1–8.1)
eGFR: 78 mL/min/{1.73_m2} (ref >=60)

## 2022-03-12 LAB — RPR: RPR Ser Ql: NONREACTIVE

## 2022-03-12 LAB — HIV-1 RNA QUANT-NO REFLEX-BLD
HIV 1 RNA Quant: 28 Copies/mL — ABNORMAL HIGH
HIV-1 RNA Quant, Log: 1.45 Log cps/mL — ABNORMAL HIGH

## 2022-03-30 ENCOUNTER — Encounter: Payer: Self-pay | Admitting: Infectious Disease

## 2022-03-30 ENCOUNTER — Ambulatory Visit (INDEPENDENT_AMBULATORY_CARE_PROVIDER_SITE_OTHER): Payer: Self-pay | Admitting: Infectious Disease

## 2022-03-30 DIAGNOSIS — E039 Hypothyroidism, unspecified: Secondary | ICD-10-CM

## 2022-03-30 DIAGNOSIS — Z7185 Encounter for immunization safety counseling: Secondary | ICD-10-CM

## 2022-03-30 DIAGNOSIS — B20 Human immunodeficiency virus [HIV] disease: Secondary | ICD-10-CM

## 2022-03-30 DIAGNOSIS — B399 Histoplasmosis, unspecified: Secondary | ICD-10-CM

## 2022-03-30 DIAGNOSIS — E1165 Type 2 diabetes mellitus with hyperglycemia: Secondary | ICD-10-CM

## 2022-03-30 DIAGNOSIS — E785 Hyperlipidemia, unspecified: Secondary | ICD-10-CM

## 2022-03-30 HISTORY — DX: Hyperlipidemia, unspecified: E78.5

## 2022-03-30 NOTE — Progress Notes (Signed)
Patient was no-show

## 2022-04-03 MED ORDER — LEVOTHYROXINE SODIUM 200 MCG PO TABS: 200.0000 ug | ORAL_TABLET | Freq: Every day | ORAL | 11 refills | Status: DC

## 2022-04-03 MED ORDER — BIKTARVY 50-200-25 MG PO TABS: 1.0000 | ORAL_TABLET | Freq: Every day | ORAL | 11 refills | Status: DC

## 2022-04-03 MED ORDER — ROSUVASTATIN CALCIUM 20 MG PO TABS: 20.0000 mg | ORAL_TABLET | Freq: Every day | ORAL | 11 refills | Status: AC

## 2022-04-03 MED ORDER — LEVOTHYROXINE SODIUM 50 MCG PO TABS: 50.0000 ug | ORAL_TABLET | Freq: Every day | ORAL | 11 refills | Status: DC

## 2022-04-03 MED ORDER — METFORMIN HCL 1000 MG PO TABS: 1000.0000 mg | ORAL_TABLET | Freq: Two times a day (BID) | ORAL | 11 refills | Status: DC

## 2022-04-27 ENCOUNTER — Telehealth: Payer: Self-pay

## 2022-04-27 NOTE — Telephone Encounter (Signed)
error 

## 2022-04-28 ENCOUNTER — Other Ambulatory Visit: Payer: Self-pay

## 2022-04-28 DIAGNOSIS — B20 Human immunodeficiency virus [HIV] disease: Secondary | ICD-10-CM

## 2022-07-14 ENCOUNTER — Other Ambulatory Visit: Payer: Self-pay

## 2022-07-14 ENCOUNTER — Ambulatory Visit: Payer: Self-pay

## 2023-04-22 NOTE — Progress Notes (Deleted)
 Subjective:  Chief complaint: follow-up for HIV disease on medications   Patient ID: David Knapp, male    DOB: [birth year], 43 y.o.   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   Throat swelling 03/22/2016   Type 2 diabetes mellitus with hyperglycemia (HCC) 06/04/2019    Past Surgical History:  Procedure Laterality Date   CHOLECYSTECTOMY N/A 12/08/2020   Procedure: LAPAROSCOPIC CHOLECYSTECTOMY;  Surgeon: Enid Harry, MD;  Location: [facility] OR;  Service: General;  Laterality: N/A;   NO PAST SURGERIES      Family History  Problem Relation Age of Onset   Coronary artery disease Mother        died from MI @ age 33 yo      Social History   Socioeconomic History   Marital status: Single    Spouse name: Not on file   Number of children: 0   Years of education: 6th grade   Highest education level: Not on file  Occupational History   Occupation: Freight forwarder: Aeronautical engineer  Tobacco Use   Smoking status: Never   Smokeless tobacco: Never  Vaping Use   Vaping status: Never Used  Substance and Sexual Activity   Alcohol use: No    Alcohol/week: 6.0 standard drinks of alcohol    Types: 6 Standard drinks or equivalent per week   Drug use: No   Sexual activity: Not Currently    Partners: Female    Comment: declined condoms  Other Topics Concern   Not on  file  Social History Narrative   Lives in [place] with his wife and 3 nephews/nieces. Is spanish speaking only - requiring interpretor. He is able to read and write in Spanish only   Social Drivers of Health   Financial Resource Strain: Not on file  Food Insecurity: Not on file  Transportation Needs: Not on file  Physical Activity: Not on file  Stress: Not on file  Social Connections: Not on file    Allergies  Allergen Reactions   Sulfa  Antibiotics Other (See Comments)    He believes he has experienced throat swelling with prezcobix  and Descovy  for nearly a year since April 2017 to present March 22, 2016     Current Outpatient Medications:    acetaminophen  (TYLENOL ) 500 MG tablet, Take 2 tablets (1,000 mg total) by mouth every 6 (six) hours as needed., Disp: 30 tablet, Rfl: 0   bictegravir-emtricitabine -tenofovir  AF (BIKTARVY ) 50-200-25 MG TABS tablet, Take 1 tablet by mouth daily., Disp: 30 tablet, Rfl: 11   levothyroxine  (SYNTHROID ) 200 MCG tablet, Take 1 tablet (200 mcg total) by mouth daily before breakfast. In Spanish please combine 200 g tablet with 50mcg tablet for total of 250 mcg/day, Disp: 30 tablet, Rfl: 11   levothyroxine  (SYNTHROID ) 50 MCG tablet, Take 1 tablet (50 mcg total) by mouth daily before breakfast. Combine with 200 mcg for total  of 250 mcg, Disp: 30  tablet, Rfl: 11   metFORMIN  (GLUCOPHAGE ) 1000 MG tablet, Take 1 tablet (1,000 mg total) by mouth 2 (two) times daily with a meal., Disp: 60 tablet, Rfl: 11   rosuvastatin  (CRESTOR ) 20 MG tablet, Take 1 tablet (20 mg total) by mouth daily., Disp: 30 tablet, Rfl: 11   Review of Systems     Objective:   Physical Exam        Assessment & Plan:

## 2023-04-23 ENCOUNTER — Telehealth: Payer: Self-pay

## 2023-04-23 ENCOUNTER — Ambulatory Visit: Payer: Self-pay | Admitting: Infectious Disease

## 2023-04-23 DIAGNOSIS — E785 Hyperlipidemia, unspecified: Secondary | ICD-10-CM

## 2023-04-23 DIAGNOSIS — B20 Human immunodeficiency virus [HIV] disease: Secondary | ICD-10-CM

## 2023-04-23 DIAGNOSIS — E039 Hypothyroidism, unspecified: Secondary | ICD-10-CM

## 2023-04-23 NOTE — Telephone Encounter (Signed)
 Using [employer] I left VM with patient to contact his provider to reschedule. Per DR Ernie Heal ok to do labs week before visit.

## 2023-04-26 ENCOUNTER — Telehealth: Payer: Self-pay

## 2023-04-26 NOTE — Telephone Encounter (Signed)
 LVM via interpreter to schedule a follow-up appointment with Dr. Ernie Heal.

## 2023-05-08 ENCOUNTER — Other Ambulatory Visit: Payer: Self-pay

## 2023-05-08 DIAGNOSIS — B20 Human immunodeficiency virus [HIV] disease: Secondary | ICD-10-CM

## 2023-05-08 MED ORDER — BIKTARVY 50-200-25 MG PO TABS: 1.0000 | ORAL_TABLET | Freq: Every day | ORAL | 0 refills | Status: DC

## 2023-05-14 NOTE — Progress Notes (Signed)
 The ASCVD Risk score (Arnett DK, et al., 2019) failed to calculate for the following reasons:   The systolic blood pressure is missing  Arlon Bergamo, BSN, Charity fundraiser

## 2023-05-20 NOTE — Progress Notes (Signed)
 HPI: David Knapp is a 43 y.o. male who presents to the [facility] pharmacy clinic for HIV follow-up.  Patient Active Problem List   Diagnosis Date Noted   Hyperlipidemia 03/30/2022   Gangrenous cholecystitis 03/03/2021   Biliary colic 12/08/2020   Type 2 diabetes mellitus with hyperglycemia (HCC) 06/04/2019   Hyperglycemia 06/03/2019   Peripheral edema 04/24/2017   Hypothyroidism 04/24/2017   Hyponatremia 04/24/2017   CKD (chronic kidney disease), stage III (HCC) 04/24/2017   CHF (congestive heart failure) (HCC) 04/23/2017   Allergic reaction 03/22/2016   Throat swelling 03/22/2016   Change in voice 03/22/2016   Acute kidney injury (HCC)    Strep throat 04/28/2015   Generalized edema    CKD (chronic kidney disease), stage II    Strep pharyngitis    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   Acute renal failure (HCC) 07/15/2014   Orthostatic hypotension 05/20/2012   Fever 05/15/2012   HIV disease (HCC) 05/15/2012   SIRS (systemic inflammatory response syndrome) (HCC) 05/15/2012   Viral warts 07/26/2007   PSORIASIS 02/13/2007   SKIN RASH 01/25/2007   Pancytopenia (HCC) 08/01/2006   Other shock (HCC) 08/01/2006   History of respiratory system disease 07/31/2006   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   BICTEGRAVIR-EMTRICITABINE -TENOFOVIR  AF (BIKTARVY ) 50-200-25 MG TABS TABLET    Take 1 tablet by mouth daily.   LEVOTHYROXINE  (SYNTHROID ) 200 MCG TABLET    Take 1 tablet (200 mcg total) by mouth daily before breakfast. In Spanish please combine 200 g tablet with 50mcg tablet for total of 250 mcg/day   LEVOTHYROXINE  (SYNTHROID ) 50 MCG TABLET    Take 1 tablet (50 mcg total) by mouth daily before breakfast. Combine with 200 mcg for total  of 250 mcg   METFORMIN   (GLUCOPHAGE ) 1000 MG TABLET    Take 1 tablet (1,000 mg total) by mouth 2 (two) times daily with a meal.   ROSUVASTATIN  (CRESTOR ) 20 MG TABLET    Take 1 tablet (20 mg total) by mouth daily.  Modified Medications   No medications on file  Discontinued Medications   No medications on file    Labs: Lab Results  Component Value Date   HIV1RNAQUANT 28 (H) 03/09/2022   HIV1RNAQUANT <20 (H) 10/12/2021   HIV1RNAQUANT NOT DETECTED 04/18/2021   CD4TABS 430 03/09/2022   CD4TABS 682 10/12/2021   CD4TABS SEE SEPARATE REPORT 04/18/2021    RPR and STI Lab Results  Component Value Date   LABRPR NON-REACTIVE 03/09/2022   LABRPR NON-REACTIVE 04/18/2021   LABRPR NON-REACTIVE 03/03/2021   LABRPR NON-REACTIVE 08/25/2020   LABRPR NON-REACTIVE 01/12/2020    STI Results GC GC CT CT  Latest Ref Rng & Units  NEGATIVE  NEGATIVE  03/09/2022 10:15 AM Negative   Negative    10/12/2021  4:01 PM Negative   Negative    08/25/2020  9:37 AM Negative   Negative    01/12/2020  8:48 AM Negative   Negative    08/11/2019  8:57 AM Negative   Negative    06/03/2019  9:37 AM Negative   Negative    10/22/2018  9:54 AM Negative   Negative    10/12/2017 12:00 AM Negative   Negative    09/25/2016 12:00 AM Negative   Negative    03/31/2014 12:00 AM Negative  Negative    05/15/2012  6:40 AM  NEGATIVE   NEGATIVE     Hepatitis B Lab Results  Component Value Date   HEPBSAB NEG 09/18/2006   HEPBSAG Negative 04/27/2017   Hepatitis C No results found for: "HEPCAB", "HCVRNAPCRQN" Hepatitis A No results found for: "HAV" Lipids: Lab Results  Component Value Date   CHOL 222 (H) 10/12/2021   TRIG 598 (H) 10/12/2021   HDL 37 (L) 10/12/2021   CHOLHDL 6.0 (H) 10/12/2021   VLDL 53 (H) 11/22/2015   LDLCALC  10/12/2021     Comment:     . LDL cholesterol not calculated. Triglyceride levels greater than 400 mg/dL invalidate calculated LDL results. . Reference range: <100 . Desirable range <100 mg/dL for primary  prevention;   <70 mg/dL for patients with CHD or diabetic patients  with > or = 2 CHD risk factors. David Knapp is now calculated using the Martin-Hopkins  calculation, which is a validated novel method providing  better accuracy than the Friedewald equation in the  estimation of Knapp.  David Knapp et al. David Knapp. 0981;191(47): 2061-2068  (http://education.[facility].com/faq/FAQ164)     Current HIV Regimen: Previously on Biktarvy , last prescription sent on 05/08/23   Assessment: David Knapp presents today for HIV follow up. He has not been seen at [facility] since 04/18/2021 with David Knapp. A Spanish interpreter was present for the appointment to aid in communication.   Asked how his Biktarvy  has been going, he stated that he has not had any gaps in taking his medication. When asked where he was getting the prescription filled he said at the [facility] on [address]. Reviewing prescription history he should have had enough medication to cover him for the last 2 years. He denies any side effects from the medication. Currently, he has around 2 weeks of medication left. Will send in a refill.  Since it has been a while since he has been seen, will get an HIV RNA with reflex to genotype and CD4 count today. Will also get routine CMP, CBC, and lipid panel.   Previously David Knapp has been monitoring his hypothyroidism and type 2 diabetes mellitus. Will get a TSH panel and A1c per his pended order today to reduce blood draws.  David Knapp requested a refill of his levothyroxine  as well. Will refill to his preferred pharmacy today.  Discussed vaccines with David Knapp. He is eligible for hepatitis A, tetanus booster, meningitis booster, and shingles series. He is on [employer] so this should be covered. Will defer vaccines to later appointments. Received Hep B series in 2008/2009, will check Hep B titer today to confirm immunity.  David Knapp is agreeable to STI testing via urine cytology and RPR today.  Previous  HIV Gentoypes: RT mutations: E28V, K32N, V35M, K102G, I135T, E169K, K173E, E194K, G196E, Q197K, T200A, R211K, K275R, V276T, Q278H, K281R, Q334N, K103N  - susceptible to all NRTIs, high-level resistance to efavirenz and nevirapine  PR mutations: L19I, D60E, L63P, H69N, V75I  - susceptible to all protease inhibitors  Integrase mutations: No integrase mutations seen  Plan: - Check HIV RNA with reflex to genotype, CD4 count, CMP, CBC, and lipid panel today - Check hepatitis B titer  - Routine STI testing with urine cytology and RPR - Check A1c and TSH panel per Dr. Sanjuan Crumbly note - Refill Biktarvy  and levothyroxine   - Scheduled follow up on 6/27 with Mylinda Asa - Call or message with any questions   Valarie Garner, PharmD PGY1 Pharmacy Resident

## 2023-05-21 ENCOUNTER — Other Ambulatory Visit: Payer: Self-pay

## 2023-05-21 ENCOUNTER — Ambulatory Visit: Payer: Self-pay | Admitting: Pharmacist

## 2023-05-21 DIAGNOSIS — B20 Human immunodeficiency virus [HIV] disease: Secondary | ICD-10-CM

## 2023-05-21 DIAGNOSIS — E039 Hypothyroidism, unspecified: Secondary | ICD-10-CM

## 2023-05-21 DIAGNOSIS — E1165 Type 2 diabetes mellitus with hyperglycemia: Secondary | ICD-10-CM

## 2023-05-21 MED ORDER — BIKTARVY 50-200-25 MG PO TABS: 1.0000 | ORAL_TABLET | Freq: Every day | ORAL | 0 refills | Status: DC

## 2023-05-21 MED ORDER — LEVOTHYROXINE SODIUM 200 MCG PO TABS: 200.0000 ug | ORAL_TABLET | Freq: Every day | ORAL | 11 refills | Status: AC

## 2023-05-21 MED ORDER — LEVOTHYROXINE SODIUM 50 MCG PO TABS: 50.0000 ug | ORAL_TABLET | Freq: Every day | ORAL | 11 refills | Status: AC

## 2023-05-22 ENCOUNTER — Ambulatory Visit: Payer: Self-pay | Admitting: Pharmacist

## 2023-05-22 LAB — C. TRACHOMATIS/N. GONORRHOEAE RNA
C. trachomatis RNA, TMA: NOT DETECTED
N. gonorrhoeae RNA, TMA: NOT DETECTED

## 2023-05-22 NOTE — Progress Notes (Signed)
 Hey Dr. Ernie Heal!  David Knapp came in for a visit yesterday so we ordered the thyroid panel and A1c you had pended for him last time you saw him. Wanted to be sure you saw them in case you wanted him to come in for another visit to see you. Thanks!

## 2023-05-22 NOTE — Progress Notes (Signed)
 I dont think he has been taking his Synthroid  or metformin ..

## 2023-05-23 LAB — T-HELPER CELLS (CD4) COUNT (NOT AT ARMC)
Absolute CD4: 517 {cells}/uL (ref 490–1740)
CD4 T Helper %: 24 % — ABNORMAL LOW (ref 30–61)
Total lymphocyte count: 2142 {cells}/uL (ref 850–3900)

## 2023-05-23 LAB — CBC WITH DIFFERENTIAL/PLATELET
Absolute Lymphocytes: 2199 {cells}/uL (ref 850–3900)
Absolute Monocytes: 302 {cells}/uL (ref 200–950)
Basophils Absolute: 50 {cells}/uL (ref 0–200)
Basophils Relative: 0.8 %
Eosinophils Absolute: 158 {cells}/uL (ref 15–500)
Eosinophils Relative: 2.5 %
HCT: 46.1 % (ref 38.5–50.0)
Hemoglobin: 15.6 g/dL (ref 13.2–17.1)
MCH: 31.6 pg (ref 27.0–33.0)
MCHC: 33.8 g/dL (ref 32.0–36.0)
MCV: 93.3 fL (ref 80.0–100.0)
MPV: 9.1 fL (ref 7.5–12.5)
Monocytes Relative: 4.8 %
Neutro Abs: 3591 {cells}/uL (ref 1500–7800)
Neutrophils Relative %: 57 %
Platelets: 224 10*3/uL (ref 140–400)
RBC: 4.94 10*6/uL (ref 4.20–5.80)
RDW: 14.1 % (ref 11.0–15.0)
Total Lymphocyte: 34.9 %
WBC: 6.3 10*3/uL (ref 3.8–10.8)

## 2023-05-23 LAB — HEMOGLOBIN A1C
Hgb A1c MFr Bld: 8.1 % — ABNORMAL HIGH (ref <5.7)
Mean Plasma Glucose: 186 mg/dL
eAG (mmol/L): 10.3 mmol/L

## 2023-05-23 LAB — COMPREHENSIVE METABOLIC PANEL WITH GFR
AG Ratio: 1.5 (calc) (ref 1.0–2.5)
ALT: 22 U/L (ref 9–46)
AST: 30 U/L (ref 10–40)
Albumin: 4.8 g/dL (ref 3.6–5.1)
Alkaline phosphatase (APISO): 68 U/L (ref 36–130)
BUN/Creatinine Ratio: 12 (calc) (ref 6–22)
BUN: 18 mg/dL (ref 7–25)
CO2: 26 mmol/L (ref 20–32)
Calcium: 9.7 mg/dL (ref 8.6–10.3)
Chloride: 102 mmol/L (ref 98–110)
Creat: 1.53 mg/dL — ABNORMAL HIGH (ref 0.60–1.29)
Globulin: 3.2 g/dL (ref 1.9–3.7)
Glucose, Bld: 163 mg/dL — ABNORMAL HIGH (ref 65–99)
Potassium: 4.1 mmol/L (ref 3.5–5.3)
Sodium: 138 mmol/L (ref 135–146)
Total Bilirubin: 1.1 mg/dL (ref 0.2–1.2)
Total Protein: 8 g/dL (ref 6.1–8.1)
eGFR: 57 mL/min/{1.73_m2} — ABNORMAL LOW (ref >=60)

## 2023-05-23 LAB — LIPID PANEL
Cholesterol: 223 mg/dL — ABNORMAL HIGH (ref <200)
HDL: 48 mg/dL (ref >=40)
LDL Cholesterol (Calc): 129 mg/dL — ABNORMAL HIGH
Non-HDL Cholesterol (Calc): 175 mg/dL — ABNORMAL HIGH (ref <130)
Total CHOL/HDL Ratio: 4.6 (calc) (ref <5.0)
Triglycerides: 323 mg/dL — ABNORMAL HIGH (ref <150)

## 2023-05-23 LAB — THYROID PANEL WITH TSH
T3 Uptake: 28 % (ref 22–35)
T4, Total: <0.7 ug/dL — ABNORMAL LOW (ref 4.9–10.5)
TSH: 47.09 m[IU]/L — ABNORMAL HIGH (ref 0.40–4.50)

## 2023-05-23 LAB — RPR: RPR Ser Ql: NONREACTIVE

## 2023-05-23 LAB — HIV RNA, RTPCR W/R GT (RTI, PI,INT)
HIV 1 RNA Quant: NOT DETECTED {copies}/mL
HIV-1 RNA Quant, Log: NOT DETECTED {Log_copies}/mL

## 2023-05-23 LAB — HEPATITIS B SURFACE ANTIBODY,QUALITATIVE: Hep B S Ab: NONREACTIVE

## 2023-05-24 ENCOUNTER — Other Ambulatory Visit: Payer: Self-pay | Admitting: Pharmacist

## 2023-05-24 DIAGNOSIS — E1165 Type 2 diabetes mellitus with hyperglycemia: Secondary | ICD-10-CM

## 2023-05-24 MED ORDER — METFORMIN HCL 1000 MG PO TABS: 1000.0000 mg | ORAL_TABLET | Freq: Two times a day (BID) | ORAL | 3 refills | Status: AC

## 2023-05-24 NOTE — Progress Notes (Signed)
 See above

## 2023-05-24 NOTE — Progress Notes (Signed)
 During our visit he said he hasn't been taking his levothyroxine  this month because he ran out of refills. I think it's reasonable to restart his previous regimen given his younger age and that he tolerated the 250 mcg/day previously. I'm unsure about his metformin  adherence, we didn't discuss this during his visit.

## 2023-05-24 NOTE — Progress Notes (Signed)
 Never got around to answering this if you want Katie/Savannah to look into it!

## 2023-06-22 NOTE — Progress Notes (Unsigned)
 HPI: David Knapp is a 43 y.o. male who presents to the [facility] pharmacy clinic for HIV follow-up.  Patient Active Problem List   Diagnosis Date Noted   Hyperlipidemia 03/30/2022   Gangrenous cholecystitis 03/03/2021   Biliary colic 12/08/2020   Type 2 diabetes mellitus with hyperglycemia (HCC) 06/04/2019   Hyperglycemia 06/03/2019   Peripheral edema 04/24/2017   Hypothyroidism 04/24/2017   Hyponatremia 04/24/2017   CKD (chronic kidney disease), stage III (HCC) 04/24/2017   CHF (congestive heart failure) (HCC) 04/23/2017   Allergic reaction 03/22/2016   Throat swelling 03/22/2016   Change in voice 03/22/2016   Acute kidney injury (HCC)    Strep throat 04/28/2015   Generalized edema    CKD (chronic kidney disease), stage II    Strep pharyngitis    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   Acute renal failure (HCC) 07/15/2014   Orthostatic hypotension 05/20/2012   Fever 05/15/2012   HIV disease (HCC) 05/15/2012   SIRS (systemic inflammatory response syndrome) (HCC) 05/15/2012   Viral warts 07/26/2007   PSORIASIS 02/13/2007   SKIN RASH 01/25/2007   Pancytopenia (HCC) 08/01/2006   Other shock (HCC) 08/01/2006   History of respiratory system disease 07/31/2006   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   BICTEGRAVIR-EMTRICITABINE -TENOFOVIR  AF (BIKTARVY ) 50-200-25 MG TABS TABLET    Take 1 tablet by mouth daily.   LEVOTHYROXINE  (SYNTHROID ) 200 MCG TABLET    Take 1 tablet (200 mcg total) by mouth daily before breakfast. In Spanish please combine 200 g tablet with 50mcg tablet for total of 250 mcg/day   LEVOTHYROXINE  (SYNTHROID ) 50 MCG TABLET    Take 1 tablet (50 mcg total) by mouth daily before breakfast. Combine with 200 mcg for total  of 250 mcg   METFORMIN   (GLUCOPHAGE ) 1000 MG TABLET    Take 1 tablet (1,000 mg total) by mouth 2 (two) times daily with a meal.   ROSUVASTATIN  (CRESTOR ) 20 MG TABLET    Take 1 tablet (20 mg total) by mouth daily.  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   Throat swelling 03/22/2016   Type 2 diabetes mellitus with hyperglycemia (HCC) 06/04/2019    Social History: Social History   Socioeconomic History   Marital status: Single    Spouse name: Not on file   Number of children: 0   Years of education: 6th grade  Highest education level: Not on file  Occupational History   Occupation: Freight forwarder: landscaping  Tobacco Use   Smoking status: Never   Smokeless tobacco: Never  Vaping Use   Vaping status: Never Used  Substance and Sexual Activity   Alcohol use: No    Alcohol/week: 6.0 standard drinks of alcohol    Types: 6 Standard drinks or equivalent per week   Drug use: No   Sexual activity: Not Currently    Partners: Female    Comment: declined condoms   Other Topics Concern   Not on file  Social History Narrative   Lives in [place] with his wife and 3 nephews/nieces. Is spanish speaking only - requiring interpretor. He is able to read and write in Spanish only   Social Drivers of Health   Financial Resource Strain: Not on file  Food Insecurity: Not on file  Transportation Needs: Not on file  Physical Activity: Not on file  Stress: Not on file  Social Connections: Not on file    Labs: Lab Results  Component Value Date   HIV1RNAQUANT NOT DETECTED 05/21/2023   HIV1RNAQUANT 28 (H) 03/09/2022   HIV1RNAQUANT <20 (H) 10/12/2021   CD4TABS 430 03/09/2022   CD4TABS 682 10/12/2021   CD4TABS SEE SEPARATE REPORT 04/18/2021    RPR and STI Lab Results  Component Value Date   LABRPR NON-REACTIVE 05/21/2023   LABRPR NON-REACTIVE 03/09/2022   LABRPR NON-REACTIVE 04/18/2021   LABRPR NON-REACTIVE 03/03/2021   LABRPR NON-REACTIVE 08/25/2020    STI Results GC GC CT CT  Latest Ref Rng & Units  NEGATIVE  NEGATIVE  03/09/2022 10:15 AM Negative   Negative    10/12/2021  4:01 PM Negative   Negative    08/25/2020  9:37 AM Negative   Negative    01/12/2020  8:48 AM Negative   Negative    08/11/2019  8:57 AM Negative   Negative    06/03/2019  9:37 AM Negative   Negative    10/22/2018  9:54 AM Negative   Negative    10/12/2017 12:00 AM Negative   Negative    09/25/2016 12:00 AM Negative   Negative    03/31/2014 12:00 AM Negative   Negative    05/15/2012  6:40 AM  NEGATIVE   NEGATIVE     Hepatitis B Lab Results  Component Value Date   HEPBSAB NON-REACTIVE 05/21/2023   HEPBSAG Negative 04/27/2017   Hepatitis C No results found for: HEPCAB, HCVRNAPCRQN Hepatitis A No results found for: HAV Lipids: Lab Results  Component Value Date   CHOL 223 (H) 05/21/2023   TRIG 323 (H) 05/21/2023   HDL 48 05/21/2023   CHOLHDL 4.6 05/21/2023   VLDL 53 (H) 11/22/2015   LDLCALC 129 (H) 05/21/2023    Current HIV  Regimen: Biktarvy   Assessment: Pritesh presents to clinic today with Spanish interpreter for HIV follow-up. He has continued Biktarvy  with no missed doses; last filled ~2 weeks ago. Will send additional refills today. Will repeat viral load today; it was undetectable in May and did not reflex to genotype. CD4 count remains stable. Noticed slight increase in creatinine; will encourage hydration. Will follow up with Dr. Fleeta Rothman in 3-4 months based on availability.   Eligible for multiple vaccines including HAV, HBV, Tdap, Menveo, and Shingles. Will administer 1/2 HAV, 1/2 HBV, Tdap booster, and Menveo booster vaccines today. Due for 2/2 HAV in 6 months and 2/2 HBV at next office visit. Will consider starting Shingles series in  the future. Denies any new sexual partners and defers STI testing today.   Patient states he has resumed levothyroxine  and metformin  without any concerns.  Plan: - Refill Biktarvy  - Continue levothyroxine  and metformin  - Administer 1/2 HAV, 1/2 HBV, Tdap, and Menveo vaccines - Follow up with Dr. Fleeta Rothman on 10/08/23   Alan Geralds, PharmD, CPP, BCIDP, AAHIVP Clinical Pharmacist Practitioner Infectious Diseases Clinical Pharmacist [facility] 06/22/2023, 12:55 PM

## 2023-06-28 ENCOUNTER — Other Ambulatory Visit: Payer: Self-pay

## 2023-06-28 ENCOUNTER — Ambulatory Visit: Payer: Self-pay | Admitting: Pharmacist

## 2023-06-28 DIAGNOSIS — Z23 Encounter for immunization: Secondary | ICD-10-CM

## 2023-06-28 DIAGNOSIS — B20 Human immunodeficiency virus [HIV] disease: Secondary | ICD-10-CM

## 2023-06-28 MED ORDER — BIKTARVY 50-200-25 MG PO TABS: 1.0000 | ORAL_TABLET | Freq: Every day | ORAL | 3 refills | Status: DC

## 2023-06-29 ENCOUNTER — Ambulatory Visit: Payer: Self-pay | Admitting: Pharmacist

## 2023-06-30 LAB — HIV-1 RNA QUANT-NO REFLEX-BLD
HIV 1 RNA Quant: NOT DETECTED {copies}/mL
HIV-1 RNA Quant, Log: NOT DETECTED {Log_copies}/mL

## 2023-07-06 ENCOUNTER — Other Ambulatory Visit: Payer: Self-pay | Admitting: Infectious Disease

## 2023-07-06 DIAGNOSIS — B20 Human immunodeficiency virus [HIV] disease: Secondary | ICD-10-CM

## 2023-07-12 ENCOUNTER — Ambulatory Visit: Payer: Self-pay

## 2023-07-13 ENCOUNTER — Other Ambulatory Visit (HOSPITAL_COMMUNITY): Payer: Self-pay

## 2023-07-30 ENCOUNTER — Ambulatory Visit: Payer: Self-pay

## 2023-07-30 ENCOUNTER — Other Ambulatory Visit: Payer: Self-pay

## 2023-10-07 NOTE — Progress Notes (Deleted)
 Subjective:  Chief complaint: follow-up for HIV disease on medications   Patient ID: David Knapp, male    DOB: 1980/09/14, 43 y.o.   MRN: [id number]  HPI  . 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   Throat swelling 03/22/2016   Type 2 diabetes mellitus with hyperglycemia (HCC) 06/04/2019    Past Surgical History:  Procedure Laterality Date   CHOLECYSTECTOMY N/A 12/08/2020   Procedure: LAPAROSCOPIC CHOLECYSTECTOMY;  Surgeon: Ebbie Cough, MD;  Location: [facility] OR;  Service: General;  Laterality: N/A;   NO PAST SURGERIES      Family History  Problem Relation Age of Onset   Coronary artery disease Mother        died from MI @ age 97 yo      Social History   Socioeconomic History   Marital status: Single    Spouse name: Not on file   Number of children: 0   Years of education: 6th grade   Highest education level: Not on file  Occupational History   Occupation: Freight forwarder: Aeronautical engineer  Tobacco Use   Smoking status: Never   Smokeless tobacco: Never  Vaping Use   Vaping status: Never Used  Substance and Sexual Activity   Alcohol use: No    Alcohol/week: 6.0 standard drinks of alcohol    Types: 6 Standard drinks or equivalent per week   Drug use: No   Sexual activity: Not Currently    Partners: Female    Comment: declined condoms  Other Topics Concern   Not on  file  Social History Narrative   Lives in [place] with his wife and 3 nephews/nieces. Is spanish speaking only - requiring interpretor. He is able to read and write in Spanish only   Social Drivers of Health   Financial Resource Strain: Not on file  Food Insecurity: Not on file  Transportation Needs: Not on file  Physical Activity: Not on file  Stress: Not on file  Social Connections: Not on file    Allergies  Allergen Reactions   Sulfa  Antibiotics Other (See Comments)    He believes he has experienced throat swelling with prezcobix  and Descovy  for nearly a year since April 2017 to present March 22, 2016     Current Outpatient Medications:    acetaminophen  (TYLENOL ) 500 MG tablet, Take 2 tablets (1,000 mg total) by mouth every 6 (six) hours as needed., Disp: 30 tablet, Rfl: 0   bictegravir-emtricitabine -tenofovir  AF (BIKTARVY ) 50-200-25 MG TABS tablet, Take 1 tablet by mouth daily., Disp: 30 tablet, Rfl: 3   levothyroxine  (SYNTHROID ) 200 MCG tablet, Take 1 tablet (200 mcg total) by mouth daily before breakfast. In Spanish please combine 200 g tablet with 50mcg tablet for total of 250 mcg/day, Disp: 30 tablet, Rfl: 11   levothyroxine  (SYNTHROID ) 50 MCG tablet, Take 1 tablet (50 mcg total) by mouth daily before breakfast. Combine with 200 mcg for total  of 250 mcg, Disp: 30  tablet, Rfl: 11   metFORMIN  (GLUCOPHAGE ) 1000 MG tablet, Take 1 tablet (1,000 mg total) by mouth 2 (two) times daily with a meal., Disp: 60 tablet, Rfl: 3   rosuvastatin  (CRESTOR ) 20 MG tablet, Take 1 tablet (20 mg total) by mouth daily., Disp: 30 tablet, Rfl: 11 \  Review of Systems     Objective:   Physical Exam        Assessment & Plan:

## 2023-10-08 ENCOUNTER — Ambulatory Visit: Payer: Self-pay | Admitting: Infectious Disease

## 2023-10-08 DIAGNOSIS — E785 Hyperlipidemia, unspecified: Secondary | ICD-10-CM

## 2023-10-08 DIAGNOSIS — E1165 Type 2 diabetes mellitus with hyperglycemia: Secondary | ICD-10-CM

## 2023-10-08 DIAGNOSIS — N1831 Chronic kidney disease, stage 3a: Secondary | ICD-10-CM

## 2023-10-08 DIAGNOSIS — B20 Human immunodeficiency virus [HIV] disease: Secondary | ICD-10-CM

## 2023-10-08 DIAGNOSIS — E039 Hypothyroidism, unspecified: Secondary | ICD-10-CM

## 2023-10-08 DIAGNOSIS — I509 Heart failure, unspecified: Secondary | ICD-10-CM

## 2023-11-23 ENCOUNTER — Other Ambulatory Visit: Payer: Self-pay | Admitting: Pharmacist

## 2023-11-23 DIAGNOSIS — B20 Human immunodeficiency virus [HIV] disease: Secondary | ICD-10-CM

## 2023-12-31 ENCOUNTER — Other Ambulatory Visit: Payer: Self-pay | Admitting: Infectious Disease

## 2023-12-31 DIAGNOSIS — B20 Human immunodeficiency virus [HIV] disease: Secondary | ICD-10-CM

## 2024-01-07 ENCOUNTER — Ambulatory Visit: Payer: Self-pay | Admitting: Infectious Disease

## 2024-01-08 ENCOUNTER — Encounter: Payer: Self-pay | Admitting: Infectious Disease

## 2024-01-08 DIAGNOSIS — Z7185 Encounter for immunization safety counseling: Secondary | ICD-10-CM | POA: Insufficient documentation

## 2024-01-10 ENCOUNTER — Ambulatory Visit: Payer: Self-pay | Admitting: Infectious Disease

## 2024-01-10 DIAGNOSIS — E039 Hypothyroidism, unspecified: Secondary | ICD-10-CM

## 2024-01-10 DIAGNOSIS — Z7185 Encounter for immunization safety counseling: Secondary | ICD-10-CM

## 2024-01-10 DIAGNOSIS — N1831 Chronic kidney disease, stage 3a: Secondary | ICD-10-CM

## 2024-01-10 DIAGNOSIS — E1165 Type 2 diabetes mellitus with hyperglycemia: Secondary | ICD-10-CM

## 2024-01-10 DIAGNOSIS — B399 Histoplasmosis, unspecified: Secondary | ICD-10-CM

## 2024-01-10 DIAGNOSIS — B20 Human immunodeficiency virus [HIV] disease: Secondary | ICD-10-CM

## 2024-01-10 DIAGNOSIS — E785 Hyperlipidemia, unspecified: Secondary | ICD-10-CM
